# Patient Record
Sex: Male | Born: 1960 | Race: Black or African American | Hispanic: No
Health system: Midwestern US, Community
[De-identification: ages and names within clinical notes are randomized; demographics above are authoritative.]

## PROBLEM LIST (undated history)

## (undated) DIAGNOSIS — K589 Irritable bowel syndrome without diarrhea: Secondary | ICD-10-CM

## (undated) DIAGNOSIS — G8929 Other chronic pain: Secondary | ICD-10-CM

## (undated) DIAGNOSIS — R55 Syncope and collapse: Secondary | ICD-10-CM

## (undated) DIAGNOSIS — D802 Selective deficiency of immunoglobulin A [IgA]: Secondary | ICD-10-CM

## (undated) DIAGNOSIS — M1990 Unspecified osteoarthritis, unspecified site: Secondary | ICD-10-CM

## (undated) DIAGNOSIS — F528 Other sexual dysfunction not due to a substance or known physiological condition: Secondary | ICD-10-CM

## (undated) DIAGNOSIS — K219 Gastro-esophageal reflux disease without esophagitis: Secondary | ICD-10-CM

## (undated) DIAGNOSIS — F329 Major depressive disorder, single episode, unspecified: Secondary | ICD-10-CM

## (undated) DIAGNOSIS — M545 Low back pain, unspecified: Secondary | ICD-10-CM

## (undated) DIAGNOSIS — K200 Eosinophilic esophagitis: Secondary | ICD-10-CM

## (undated) DIAGNOSIS — I10 Essential (primary) hypertension: Secondary | ICD-10-CM

## (undated) DIAGNOSIS — G43909 Migraine, unspecified, not intractable, without status migrainosus: Secondary | ICD-10-CM

## (undated) DIAGNOSIS — E119 Type 2 diabetes mellitus without complications: Secondary | ICD-10-CM

## (undated) DIAGNOSIS — E785 Hyperlipidemia, unspecified: Secondary | ICD-10-CM

## (undated) DIAGNOSIS — F419 Anxiety disorder, unspecified: Secondary | ICD-10-CM

## (undated) HISTORY — DX: Gastro-esophageal reflux disease without esophagitis: K21.9

## (undated) HISTORY — DX: Irritable bowel syndrome, unspecified: K58.9

## (undated) HISTORY — DX: Anxiety disorder, unspecified: F41.9

## (undated) HISTORY — PX: LUMBAR LAMINECTOMY: SHX95

## (undated) HISTORY — DX: Other sexual dysfunction not due to a substance or known physiological condition: F52.8

## (undated) HISTORY — DX: Essential (primary) hypertension: I10

## (undated) HISTORY — DX: Major depressive disorder, single episode, unspecified: F32.9

## (undated) HISTORY — DX: Eosinophilic esophagitis: K20.0

## (undated) HISTORY — PX: PERIPHERALLY INSERTED CENTRAL CATHETER INSERTION: SHX2221

## (undated) HISTORY — PX: BACK SURGERY: SHX140

## (undated) HISTORY — DX: Unspecified osteoarthritis, unspecified site: M19.90

## (undated) HISTORY — DX: Hyperlipidemia, unspecified: E78.5

## (undated) MED ORDER — ZONISAMIDE 50 MG OR CAPS
50.00 mg | ORAL_CAPSULE | Freq: Three times a day (TID) | ORAL | 2 refills | Status: AC
Start: 2022-11-16 — End: ?

## (undated) MED ORDER — CLONAZEPAM 1 MG OR TABS
1.00 mg | ORAL_TABLET | Freq: Every evening | ORAL | 3 refills | Status: AC
Start: 2022-06-24 — End: ?

## (undated) MED ORDER — ZONISAMIDE 50 MG OR CAPS
50.0000 mg | ORAL_CAPSULE | Freq: Three times a day (TID) | ORAL | 0 refills | Status: AC
Start: 2023-07-09 — End: 2023-10-07

## (undated) MED ORDER — CLONAZEPAM 1 MG OR TABS
1.0000 mg | ORAL_TABLET | Freq: Every evening | ORAL | 3 refills | Status: AC
Start: 2021-04-27 — End: ?

---
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
## 2004-08-12 HISTORY — PX: LUMBAR FUSION: SHX111

## 2005-08-12 HISTORY — PX: LUMBAR FUSION: SHX111

## 2009-08-12 HISTORY — PX: SPINAL CORD STIMULATOR IMPLANT: SHX2422

## 2009-11-23 ENCOUNTER — Encounter: Admission: RE | Admit: 2009-11-23 | Discharge: 2009-11-23 | Payer: Self-pay | Admitting: *Deleted

## 2009-11-28 ENCOUNTER — Encounter: Admission: RE | Admit: 2009-11-28 | Discharge: 2009-11-28 | Payer: Self-pay | Admitting: *Deleted

## 2009-11-29 ENCOUNTER — Inpatient Hospital Stay (HOSPITAL_COMMUNITY): Admission: RE | Admit: 2009-11-29 | Discharge: 2009-11-30 | Payer: Self-pay | Admitting: *Deleted

## 2010-04-15 ENCOUNTER — Ambulatory Visit (HOSPITAL_COMMUNITY): Admission: RE | Admit: 2010-04-15 | Discharge: 2010-04-15 | Payer: Self-pay | Admitting: Psychiatry

## 2010-04-17 ENCOUNTER — Other Ambulatory Visit (HOSPITAL_COMMUNITY): Admission: RE | Admit: 2010-04-17 | Discharge: 2010-05-11 | Payer: Self-pay | Admitting: Psychiatry

## 2010-04-18 ENCOUNTER — Ambulatory Visit (HOSPITAL_COMMUNITY): Payer: Self-pay | Admitting: Psychiatry

## 2010-05-08 ENCOUNTER — Ambulatory Visit (HOSPITAL_COMMUNITY): Payer: Self-pay | Admitting: Psychiatry

## 2010-05-15 ENCOUNTER — Ambulatory Visit: Payer: Self-pay | Admitting: Psychiatry

## 2010-07-10 ENCOUNTER — Encounter: Admission: RE | Admit: 2010-07-10 | Discharge: 2010-07-10 | Payer: Self-pay | Admitting: *Deleted

## 2010-08-28 ENCOUNTER — Encounter
Admission: RE | Admit: 2010-08-28 | Discharge: 2010-08-28 | Payer: Self-pay | Source: Home / Self Care | Attending: Orthopedic Surgery | Admitting: Orthopedic Surgery

## 2010-09-02 ENCOUNTER — Encounter: Payer: Self-pay | Admitting: Orthopedic Surgery

## 2010-09-12 ENCOUNTER — Ambulatory Visit (HOSPITAL_COMMUNITY): Payer: Managed Care, Other (non HMO)

## 2010-09-12 ENCOUNTER — Encounter: Payer: Self-pay | Admitting: Orthopedic Surgery

## 2010-09-12 ENCOUNTER — Ambulatory Visit (HOSPITAL_COMMUNITY)
Admission: RE | Admit: 2010-09-12 | Discharge: 2010-09-13 | Disposition: A | Discharge status: Home / Self Care | Payer: Managed Care, Other (non HMO) | Source: Ambulatory Visit | Attending: Orthopedic Surgery | Admitting: Orthopedic Surgery

## 2010-09-12 DIAGNOSIS — M5030 Other cervical disc degeneration, unspecified cervical region: Secondary | ICD-10-CM | POA: Insufficient documentation

## 2010-09-12 DIAGNOSIS — I2510 Atherosclerotic heart disease of native coronary artery without angina pectoris: Secondary | ICD-10-CM | POA: Insufficient documentation

## 2010-09-12 DIAGNOSIS — I252 Old myocardial infarction: Secondary | ICD-10-CM | POA: Insufficient documentation

## 2010-09-12 HISTORY — PX: ANTERIOR CERVICAL DECOMP/DISCECTOMY FUSION: SHX1161

## 2010-09-12 LAB — URINALYSIS, ROUTINE W REFLEX MICROSCOPIC
Nitrite: NEGATIVE
Urobilinogen, UA: 1 mg/dL (ref 0.0–1.0)
pH: 6 (ref 5.0–8.0)

## 2010-09-12 LAB — COMPREHENSIVE METABOLIC PANEL
ALT: 28 U/L (ref 0–53)
AST: 21 U/L (ref 0–37)
Albumin: 4.6 g/dL (ref 3.5–5.2)
Alkaline Phosphatase: 68 U/L (ref 39–117)
Calcium: 9.5 mg/dL (ref 8.4–10.5)
GFR calc Af Amer: >60 mL/min (ref >60)
Glucose, Bld: 94 mg/dL (ref 70–99)
Potassium: 4.7 mEq/L (ref 3.5–5.1)
Sodium: 141 mEq/L (ref 135–145)
Total Protein: 7.4 g/dL (ref 6.0–8.3)

## 2010-09-12 LAB — DIFFERENTIAL
Basophils Absolute: 0 10*3/uL (ref 0.0–0.1)
Basophils Relative: 0 % (ref 0–1)
Eosinophils Relative: 5 % (ref 0–5)
Monocytes Absolute: 0.3 10*3/uL (ref 0.1–1.0)
Monocytes Relative: 6 % (ref 3–12)

## 2010-09-12 LAB — CBC
HCT: 42.3 % (ref 39.0–52.0)
MCH: 27.8 pg (ref 26.0–34.0)
MCHC: 32.4 g/dL (ref 30.0–36.0)
RDW: 13 % (ref 11.5–15.5)

## 2010-09-12 LAB — TYPE AND SCREEN
ABO/RH(D): AB POS
Antibody Screen: NEGATIVE

## 2010-09-12 LAB — APTT: aPTT: 28 seconds (ref 24–37)

## 2010-09-21 NOTE — Op Note (Signed)
NAME:  Dennis Conley, Dennis Conley                 ACCOUNT NO.:  [id number]  MEDICAL RECORD NO.:  [id number]           PATIENT TYPE:  O  LOCATION:  5037                         FACILITY:  [facility]  PHYSICIAN:  Estill Bamberg, MD      DATE OF BIRTH:  Dec 19, 1960  DATE OF PROCEDURE:  09/12/2010 DATE OF DISCHARGE:                              OPERATIVE REPORT   PREOPERATIVE DIAGNOSES: 1. C4-5, C5-6, C6-7 degenerative disk disease. 2. Left-sided neural foraminal stenosis, C4-5, C5-6, C6-7. 3. Left-sided cervical radiculopathy.  POSTOPERATIVE DIAGNOSES: 1. C4-5, C5-6, C6-7 degenerative disk disease. 2. Left-sided neural foraminal stenosis, C4-5, C5-6, C6-7. 3. Left-sided cervical radiculopathy.  PROCEDURE: 1. C4-5, C5-6, C6-7 anterior cervical decompression and fusion. 2. Placement of structural allograft x3. 3. Use of local autograft. 4. Placement of anterior instrumentation, C4 to C7. 5. Intraoperative use of fluoroscopy.  SURGEON:  Estill Bamberg, MD  ASSISTANT:  Laural Benes. Su Hilt, PA-C  ANESTHESIA:  General endotracheal anesthesia.  ESTIMATED BLOOD LOSS:  200 mL.  COMPLICATIONS:  None.  FINDINGS:  Left-sided neural foraminal stenosis, C4-5, C5-6, C6-7.  INDICATIONS FOR PROCEDURE:  Briefly, Mr. Bihl is a pleasant 50 year old male who I have following in my office with signs and symptoms related to left-sided cervical radiculopathy.  The patient failed extensive conservative measures.  I did review a CT myelogram which was consistent with prominent findings of C4-5 and C5-6, which did reveal a disk osteophyte complex compromising the left neural foramen.  The left neural foramen at the C6-7 level was also compromised to a lesser degree.  Of note, an EMG was diagnostic for left-sided C7 radiculopathy in addition an ulnar nerve compression and carpal tunnel syndrome.  The patient did have an epidural injection, which he states it did entirely alleviate his symptoms, but his pain did recur.   We therefore had a discussion regarding the procedure outlined above.  The patient did understand the risks and limitations of the procedure, as outlined in my preoperative note.  OPERATIVE DETAILS:  On September 13, 2010, the patient was brought to surgery and general endotracheal anesthesia was administered.  The patient was placed supine on an operating room table.  The neck was placed in a gentle degree of extension and the shoulders were taped to the inferior aspect of the bed.  I did use a lateral fluoroscopic view in order to help optimize the location of my incision.  A time-out procedure was performed and SCDs were placed.  Ancef was given for antibiotic prophylaxis.  The neck was then prepped and draped in the usual sterile fashion.  I then made a left-sided incision and did identify the plane between the sternocleidomastoid muscle laterally and strap muscles medially.  This plane was explored and the anterior cervical spine was readily identified.  The C4, C5, C6 and C7 vertebral bodies of subperiosteally exposed.  A self-retaining shadow line retractor was placed over the C6-7 interspace.  I then went forward with a diskectomy removing approximately two-thirds of the disk anteriorly. I then placed Caspar pins of the C6 and C7 vertebral bodies and gentle distraction was applied.  I then  used a high-speed bur in addition to curettes and Kerrison punches in order to complete my diskectomy.  The posterior longitudinal ligament was identified and this was entered using a nerve hook.  The posterior longitudinal ligament was then taken down and the right and left neural foramina were decompressed in the usual manner.  I then prepared the endplate gently using a 3-mm bur and a rasp.  I did feel that a 7-mm implant would be the appropriate fit and a 7-mm graft from the musculoskeletal transplant foundation was tamped into place.  Of note, I did collect autograft from the burring aspect  of the procedure and this was placed above and below the allograft prior to placement to help optimize fusion success.  I then went forward with a diskectomy next with the C5-6 and then the C4-5 levels the manner I described previously.  A 7-mm grafts were also placed.  Autograft was also placed above and below the graft.  Again, the neural foramen on both the right and left sides were entirely decompressed prior to placement of the graft prior to preparation of the endplates.  I then selected an appropriate-sized Synthes Vectra plate.  The plate was bent into the appropriate amount of lordosis and self-drilling and self- tapping screws were placed into the C4, C5, C6 and C7 vertebral bodies. Of note, I did use Caspar pin distraction at each intervertebral space and the holes after the Caspar pins were removed were sealed using bone wax.  All bleeding was meticulously controlled at the termination of the procedure.  At the end of the procedure, I copiously irrigated the wound.  I then closed the platysma using 2-0 Vicryl and I closed the skin using 4-0 Monocryl.  All instruments were correct at the termination of the procedure.  Of note, I did use AP and lateral fluoroscopic views while placing the anterior instrumentation in order to help optimize the location of the instrumentation.  Of note, Dan Humphreys was my assistant throughout the entire procedure and aided in essential retraction and suctioning that was required throughout the procedure.     Estill Bamberg, MD     MD/MEDQ  D:  09/12/2010  T:  09/13/2010  Job:  [id number]  Electronically Signed by Estill Bamberg  on 09/21/2010 08:17:49 AM

## 2010-09-28 LAB — POCT I-STAT 4, (NA,K, GLUC, HGB,HCT)
Glucose, Bld: 83 mg/dL (ref 70–99)
HCT: 44 % (ref 39.0–52.0)
Hemoglobin: 15 g/dL (ref 13.0–17.0)
Potassium: 6.2 mEq/L — ABNORMAL HIGH (ref 3.5–5.1)

## 2010-10-29 ENCOUNTER — Ambulatory Visit (INDEPENDENT_AMBULATORY_CARE_PROVIDER_SITE_OTHER): Payer: Managed Care, Other (non HMO) | Admitting: Internal Medicine

## 2010-10-29 ENCOUNTER — Encounter: Payer: Self-pay | Admitting: Internal Medicine

## 2010-10-29 DIAGNOSIS — M1990 Unspecified osteoarthritis, unspecified site: Secondary | ICD-10-CM | POA: Insufficient documentation

## 2010-10-29 DIAGNOSIS — F3289 Other specified depressive episodes: Secondary | ICD-10-CM

## 2010-10-29 DIAGNOSIS — F528 Other sexual dysfunction not due to a substance or known physiological condition: Secondary | ICD-10-CM | POA: Insufficient documentation

## 2010-10-29 DIAGNOSIS — R519 Headache, unspecified: Secondary | ICD-10-CM | POA: Insufficient documentation

## 2010-10-29 DIAGNOSIS — R51 Headache: Secondary | ICD-10-CM | POA: Insufficient documentation

## 2010-10-29 DIAGNOSIS — F329 Major depressive disorder, single episode, unspecified: Secondary | ICD-10-CM

## 2010-10-29 DIAGNOSIS — E785 Hyperlipidemia, unspecified: Secondary | ICD-10-CM

## 2010-10-29 HISTORY — DX: Other specified depressive episodes: F32.89

## 2010-10-29 HISTORY — DX: Other sexual dysfunction not due to a substance or known physiological condition: F52.8

## 2010-10-29 HISTORY — DX: Major depressive disorder, single episode, unspecified: F32.9

## 2010-10-29 HISTORY — DX: Unspecified osteoarthritis, unspecified site: M19.90

## 2010-10-29 HISTORY — DX: Hyperlipidemia, unspecified: E78.5

## 2010-10-31 LAB — CBC
MCV: 84.3 fL (ref 78.0–100.0)
Platelets: 160 10*3/uL (ref 150–400)
RBC: 5.16 MIL/uL (ref 4.22–5.81)
WBC: 4.1 10*3/uL (ref 4.0–10.5)

## 2010-10-31 LAB — COMPREHENSIVE METABOLIC PANEL
ALT: 48 U/L (ref 0–53)
AST: 33 U/L (ref 0–37)
Albumin: 4.8 g/dL (ref 3.5–5.2)
CO2: 24 mEq/L (ref 19–32)
Chloride: 106 mEq/L (ref 96–112)
Creatinine, Ser: 1.04 mg/dL (ref 0.4–1.5)
GFR calc Af Amer: >60 mL/min (ref >60)
GFR calc non Af Amer: >60 mL/min (ref >60)
Sodium: 138 mEq/L (ref 135–145)
Total Bilirubin: 0.6 mg/dL (ref 0.3–1.2)

## 2010-10-31 LAB — DIFFERENTIAL
Basophils Absolute: 0 10*3/uL (ref 0.0–0.1)
Eosinophils Absolute: 0 10*3/uL (ref 0.0–0.7)
Eosinophils Relative: 1 % (ref 0–5)
Lymphocytes Relative: 26 % (ref 12–46)
Lymphs Abs: 1.1 10*3/uL (ref 0.7–4.0)
Monocytes Absolute: 0.3 10*3/uL (ref 0.1–1.0)

## 2010-10-31 LAB — URINALYSIS, ROUTINE W REFLEX MICROSCOPIC
Nitrite: NEGATIVE
Specific Gravity, Urine: 1.015 (ref 1.005–1.030)
Urobilinogen, UA: 1 mg/dL (ref 0.0–1.0)

## 2010-10-31 LAB — APTT: aPTT: 29 seconds (ref 24–37)

## 2010-10-31 LAB — TYPE AND SCREEN
ABO/RH(D): AB POS
Antibody Screen: NEGATIVE

## 2010-10-31 LAB — PROTIME-INR: INR: 1.16 (ref 0.00–1.49)

## 2010-11-06 ENCOUNTER — Other Ambulatory Visit: Payer: Self-pay | Admitting: Orthopedic Surgery

## 2010-11-06 DIAGNOSIS — M545 Low back pain: Secondary | ICD-10-CM

## 2010-11-08 NOTE — Assessment & Plan Note (Signed)
Summary: new to [employer]/# / cd   Vital Signs:  Patient profile:   50 year old male Height:      74 inches Weight:      209 pounds BMI:     26.93 O2 Sat:      96 % on Room air Temp:     98.8 degrees F oral Pulse rate:   95 / minute Pulse rhythm:   regular Resp:     16 per minute BP sitting:   124 / 80  (left arm) Cuff size:   large  Vitals Entered By: Rock Nephew CMA (October 29, 2010 10:35 AM)  Nutrition Counseling: Patient's BMI is greater than 25 and therefore counseled on weight management options.  O2 Flow:  Room air  Primary Care Provider:  Etta Grandchild MD   History of Present Illness: New to me he wants to find a new PCP, he tells me that he had a complete physical with Dr. Luiz Iron 2 months ago but today he complains of ED over the last 3 months. He tells me that he has BPH but his PSA was normal 2 months ago by his report - no old records are available today.  Preventive Screening-Counseling & Management  Alcohol-Tobacco     Alcohol drinks/day: 0     Alcohol Counseling: not indicated; patient does not drink     Smoking Status: never     Tobacco Counseling: not indicated; no tobacco use  Caffeine-Diet-Exercise     Does Patient Exercise: no  Hep-HIV-STD-Contraception     Hepatitis Risk: no risk noted     HIV Risk: no risk noted     STD Risk: no risk noted      Sexual History:  currently monogamous.        Drug Use:  no.        Blood Transfusions:  no.    Current Medications (verified): 1)  Trazodone Hcl 100 Mg Tabs (Trazodone Hcl) .... Take 1 Tablet By Mouth Once A Day 2)  Prilosec 40 Mg Cpdr (Omeprazole) .... Take 1 Tablet By Mouth Once A Day 3)  Morphine Sulfate Cr 30 Mg Xr12h-Tab (Morphine Sulfate) .... 3tabs Daily 4)  Clonazepam 2 Mg Tabs (Clonazepam) .... Take 1 Tablet By Mouth Two Times A Day 5)  Gabapentin 300 Mg Caps (Gabapentin) .... 3 Tabs Three Times A Day  Allergies (verified): No Known Drug Allergies  Past History:  Past Medical  History: Depression - Dr. Dub Mikes Headache Hyperlipidemia Osteoarthritis  Past Surgical History: Cervical fusion Cervical laminectomy 2012 - Dr. Yevette Edwards Lumbar laminectomy 2007 Lumbar fusion  Family History: Reviewed history and no changes required. Family History of Alcoholism/Addiction Family History of Arthritis Family History High cholesterol Family History Hypertension Family History of Prostate CA 1st degree relative  Family History of Stroke F 1st degree relative   Social History: Reviewed history and no changes required. Married/disabled Never Smoked Alcohol use-no Drug use-no Regular exercise-no Smoking Status:  never Drug Use:  no Does Patient Exercise:  no Hepatitis Risk:  no risk noted HIV Risk:  no risk noted STD Risk:  no risk noted Sexual History:  currently monogamous Blood Transfusions:  no  Review of Systems  The patient denies anorexia, fever, weight loss, weight gain, chest pain, syncope, dyspnea on exertion, peripheral edema, prolonged cough, headaches, hemoptysis, abdominal pain, hematuria, incontinence, genital sores, suspicious skin lesions, difficulty walking, enlarged lymph nodes, and testicular masses.   GU:  Complains of erectile dysfunction; denies decreased libido, discharge,  dysuria, genital sores, hematuria, incontinence, nocturia, urinary frequency, and urinary hesitancy.  Physical Exam  General:  alert, well-developed, well-nourished, well-hydrated, appropriate dress, healthy-appearing, cooperative to examination, and good hygiene.   Head:  Aspen collar in place. Eyes:  vision grossly intact, pupils equal, pupils round, and pupils reactive to light.   Ears:  R ear normal and L ear normal.   Mouth:  Oral mucosa and oropharynx without lesions or exudates.  Teeth in good repair. Neck:  Aspen collar Lungs:  Normal respiratory effort, chest expands symmetrically. Lungs are clear to auscultation, no crackles or wheezes. Heart:  Normal rate  and regular rhythm. S1 and S2 normal without gallop, murmur, click, rub or other extra sounds. Abdomen:  soft, non-tender, normal bowel sounds, no distention, no masses, no guarding, no rigidity, no rebound tenderness, no abdominal hernia, no inguinal hernia, no hepatomegaly, and no splenomegaly.   Rectal:  No external abnormalities noted. Normal sphincter tone. No rectal masses or tenderness. Genitalia:  circumcised, no hydrocele, no varicocele, no scrotal masses, no testicular masses or atrophy, no cutaneous lesions, and no urethral discharge.   Prostate:  no nodules, no asymmetry, no induration, and 1+ enlarged.   Msk:  No deformity or scoliosis noted of thoracic or lumbar spine.   Pulses:  R and L carotid,radial,femoral,dorsalis pedis and posterior tibial pulses are full and equal bilaterally Extremities:  No clubbing, cyanosis, edema, or deformity noted with normal full range of motion of all joints.   Neurologic:  No cranial nerve deficits noted. Station and gait are normal. Plantar reflexes are down-going bilaterally. DTRs are symmetrical throughout. Sensory, motor and coordinative functions appear intact. Skin:  Intact without suspicious lesions or rashes Cervical Nodes:  No lymphadenopathy noted Axillary Nodes:  No palpable lymphadenopathy Inguinal Nodes:  No significant adenopathy Psych:  Cognition and judgment appear intact. Alert and cooperative with normal attention span and concentration. No apparent delusions, illusions, hallucinations   Impression & Recommendations:  Problem # 1:  ERECTILE DYSFUNCTION (ICD-302.72) Assessment New  I have requested records from Dr. Luiz Iron to review his recent PSA results and other info. His updated medication list for this problem includes:    Cialis 20 Mg Tabs (Tadalafil) .Marland Kitchen... Take one by mouth as directed  Discussed proper use of medications, as well as side effects.   Complete Medication List: 1)  Trazodone Hcl 100 Mg Tabs (Trazodone  hcl) .... Take 1 tablet by mouth once a day 2)  Prilosec 40 Mg Cpdr (Omeprazole) .... Take 1 tablet by mouth once a day 3)  Morphine Sulfate Cr 30 Mg Xr12h-tab (Morphine sulfate) .... 3tabs daily 4)  Clonazepam 2 Mg Tabs (Clonazepam) .... Take 1 tablet by mouth two times a day 5)  Gabapentin 300 Mg Caps (Gabapentin) .... 3 tabs three times a day 6)  Cialis 20 Mg Tabs (Tadalafil) .... Take one by mouth as directed  Colorectal Screening:  Current Recommendations:    Hemoccult: NEG X 1 today  Immunization & Chemoprophylaxis:    Tetanus vaccine: Historical  (08/12/2006)    Influenza vaccine: Historical  (09/12/2010)    Pneumovax: Historical  (09/12/2010)  Patient Instructions: 1)  Please schedule a follow-up appointment in 3 months. Prescriptions: CIALIS 20 MG TABS (TADALAFIL) take one by mouth as directed  #3 x 11   Entered and Authorized by:   Etta Grandchild MD   Signed by:   Etta Grandchild MD on 10/29/2010   Method used:   Print then Give to Patient  RxID:   [id number]    Orders Added: 1)  New Patient Level IV [56213]   Immunization History:  Influenza Immunization History:    Influenza:  historical (09/12/2010)  Pneumovax Immunization History:    Pneumovax:  historical (09/12/2010)   Immunization History:  Influenza Immunization History:    Influenza:  Historical (09/12/2010)  Pneumovax Immunization History:    Pneumovax:  Historical (09/12/2010)  Preventive Care Screening  Last Tetanus Booster:    Date:  08/12/2006    Results:  Historical   Colonoscopy:    Date:  08/12/2006    Results:  done

## 2010-11-09 ENCOUNTER — Ambulatory Visit
Admission: RE | Admit: 2010-11-09 | Discharge: 2010-11-09 | Disposition: A | Discharge status: Home / Self Care | Payer: Managed Care, Other (non HMO) | Source: Ambulatory Visit | Attending: Orthopedic Surgery | Admitting: Orthopedic Surgery

## 2010-11-09 ENCOUNTER — Telehealth: Payer: Self-pay | Admitting: Internal Medicine

## 2010-11-09 DIAGNOSIS — M545 Low back pain: Secondary | ICD-10-CM

## 2010-11-09 NOTE — Telephone Encounter (Signed)
Forwarded to Dr. Jones for review. °

## 2010-11-15 ENCOUNTER — Other Ambulatory Visit: Payer: Self-pay | Admitting: Orthopedic Surgery

## 2010-11-15 DIAGNOSIS — M545 Low back pain: Secondary | ICD-10-CM

## 2010-11-16 ENCOUNTER — Ambulatory Visit
Admission: RE | Admit: 2010-11-16 | Discharge: 2010-11-16 | Disposition: A | Discharge status: Home / Self Care | Payer: Managed Care, Other (non HMO) | Source: Ambulatory Visit | Attending: Orthopedic Surgery | Admitting: Orthopedic Surgery

## 2010-11-16 DIAGNOSIS — M545 Low back pain: Secondary | ICD-10-CM

## 2010-11-27 ENCOUNTER — Other Ambulatory Visit: Payer: Self-pay | Admitting: Orthopedic Surgery

## 2010-11-27 DIAGNOSIS — M545 Low back pain: Secondary | ICD-10-CM

## 2010-12-04 ENCOUNTER — Other Ambulatory Visit: Payer: Managed Care, Other (non HMO)

## 2010-12-14 ENCOUNTER — Ambulatory Visit (HOSPITAL_COMMUNITY)
Admission: RE | Admit: 2010-12-14 | Discharge: 2010-12-14 | Disposition: A | Discharge status: Home / Self Care | Payer: Self-pay | Source: Ambulatory Visit | Attending: Psychiatry | Admitting: Psychiatry

## 2010-12-14 DIAGNOSIS — F332 Major depressive disorder, recurrent severe without psychotic features: Secondary | ICD-10-CM | POA: Insufficient documentation

## 2010-12-24 ENCOUNTER — Other Ambulatory Visit (HOSPITAL_COMMUNITY): Payer: Self-pay | Admitting: Psychiatry

## 2010-12-25 ENCOUNTER — Other Ambulatory Visit (HOSPITAL_COMMUNITY): Payer: Self-pay | Attending: Psychiatry | Admitting: Psychiatry

## 2010-12-25 DIAGNOSIS — F332 Major depressive disorder, recurrent severe without psychotic features: Secondary | ICD-10-CM | POA: Insufficient documentation

## 2010-12-25 DIAGNOSIS — M549 Dorsalgia, unspecified: Secondary | ICD-10-CM | POA: Insufficient documentation

## 2010-12-25 DIAGNOSIS — F411 Generalized anxiety disorder: Secondary | ICD-10-CM | POA: Insufficient documentation

## 2010-12-25 DIAGNOSIS — G8929 Other chronic pain: Secondary | ICD-10-CM | POA: Insufficient documentation

## 2010-12-26 ENCOUNTER — Other Ambulatory Visit (HOSPITAL_COMMUNITY): Payer: Self-pay | Admitting: Psychiatry

## 2010-12-27 ENCOUNTER — Other Ambulatory Visit (HOSPITAL_COMMUNITY): Payer: Self-pay | Admitting: Psychiatry

## 2010-12-28 ENCOUNTER — Other Ambulatory Visit (HOSPITAL_COMMUNITY): Payer: Self-pay | Admitting: Psychiatry

## 2010-12-31 ENCOUNTER — Other Ambulatory Visit (HOSPITAL_COMMUNITY): Payer: Self-pay | Admitting: Psychiatry

## 2011-01-01 ENCOUNTER — Other Ambulatory Visit (HOSPITAL_COMMUNITY): Payer: Self-pay | Admitting: Psychiatry

## 2011-01-02 ENCOUNTER — Encounter: Payer: Self-pay | Admitting: Internal Medicine

## 2011-01-02 ENCOUNTER — Ambulatory Visit: Payer: Self-pay | Admitting: Internal Medicine

## 2011-01-02 ENCOUNTER — Other Ambulatory Visit (HOSPITAL_COMMUNITY): Payer: Self-pay | Admitting: Psychiatry

## 2011-01-02 DIAGNOSIS — Z0000 Encounter for general adult medical examination without abnormal findings: Secondary | ICD-10-CM | POA: Insufficient documentation

## 2011-01-03 ENCOUNTER — Other Ambulatory Visit (HOSPITAL_BASED_OUTPATIENT_CLINIC_OR_DEPARTMENT_OTHER): Payer: Self-pay | Admitting: Psychiatry

## 2011-01-03 ENCOUNTER — Encounter: Payer: Self-pay | Admitting: Internal Medicine

## 2011-01-03 ENCOUNTER — Ambulatory Visit (INDEPENDENT_AMBULATORY_CARE_PROVIDER_SITE_OTHER): Payer: Self-pay | Admitting: Internal Medicine

## 2011-01-03 VITALS — BP 130/86 | HR 121 | Temp 98.6°F | Ht 74.0 in | Wt 196.0 lb

## 2011-01-03 DIAGNOSIS — F332 Major depressive disorder, recurrent severe without psychotic features: Secondary | ICD-10-CM

## 2011-01-03 DIAGNOSIS — G8929 Other chronic pain: Secondary | ICD-10-CM | POA: Insufficient documentation

## 2011-01-03 DIAGNOSIS — F329 Major depressive disorder, single episode, unspecified: Secondary | ICD-10-CM

## 2011-01-03 DIAGNOSIS — M549 Dorsalgia, unspecified: Secondary | ICD-10-CM

## 2011-01-03 MED ORDER — OXYCODONE-ACETAMINOPHEN 10-325 MG PO TABS: 1.0000 | ORAL_TABLET | Freq: Four times a day (QID) | ORAL | Status: DC | PRN

## 2011-01-03 NOTE — Progress Notes (Signed)
Subjective:    Patient ID: Dennis Conley, male    DOB: 12/09/1960, 50 y.o.   MRN: [id number]  HPI  Pt of Dr Yetta Barre who is not here today;  S/p 3 level (S1, L2, and L3) back fusion 2005 in [place] with re-do screw surgury 2006, with chronic pain since then, now on dilaudid tid, and cymbalta.  Moved to [place] 2010, been seeing [facility] since then with some recurring numbness to left thigh and recurring pain to whole leg distal to toes;  In 2011 with spinal cord stimulator which worked well for only 2 months;  Also with recent worsening c-spine disc dz with 3 level surgury (? Fusion) in jan 2012 - Dr Yevette Edwards.  Also seeing [facility] in [place], with tentative plan for repeat spinal cord stimulator, but pt is thinking he has decided against this.   Last myelogram (cant do MRI due to metal in the back) with worsening L4 disc dz, now s/p ESI x 2 two wks ago, but did not seem to help, so pt does not want further disc surgury (and ortho agrees).  Has come to the end of his options and wants to come here for simple pain meds, and requests percocet instead of Morphine or dilaudid.  Denies worsening depressive symptoms, suicidal ideation, or panic.   Past Medical History  Diagnosis Date  . DEPRESSION 10/29/2010  . ERECTILE DYSFUNCTION 10/29/2010  . Headache 10/29/2010  . HYPERLIPIDEMIA 10/29/2010  . OSTEOARTHRITIS 10/29/2010   Past Surgical History  Procedure Date  . Cervical fusion   . Cervical laminectomy 2012    Dr. Yevette Edwards  . Lumber laminectomy 2007  . Lumbar fusion     reports that he has never smoked. He does not have any smokeless tobacco history on file. He reports that he does not drink alcohol or use illicit drugs. family history includes Alcohol abuse in his other; Arthritis in his other; Cancer in his other; Hyperlipidemia in his other; Hypertension in his other; and Stroke in his other. No Known Allergies Current Outpatient Prescriptions on File Prior to Visit    Medication Sig Dispense Refill  . clonazePAM (KLONOPIN) 2 MG tablet Take 2 mg by mouth 2 (two) times daily.        Marland Kitchen gabapentin (NEURONTIN) 300 MG capsule 3 tablets 3 times per day       . omeprazole (PRILOSEC) 40 MG capsule Take 40 mg by mouth daily.        Marland Kitchen morphine (MS CONTIN) 30 MG 12 hr tablet Take 30 mg by mouth 3 (three) times daily.        . tadalafil (CIALIS) 20 MG tablet Take 20 mg by mouth daily as needed.        . traZODone (DESYREL) 100 MG tablet Take 100 mg by mouth daily.         Review of Systems All otherwise neg per pt     Objective:   Physical Exam BP 130/86  Pulse 121  Temp(Src) 98.6 F (37 C) (Oral)  Ht 6\' 2"  (1.88 m)  Wt 196 lb (88.905 kg)  BMI 25.16 kg/m2  SpO2 98% Physical Exam  VS noted Constitutional: Pt appears well-developed and well-nourished.  HENT: Head: Normocephalic.  Right Ear: External ear normal.  Left Ear: External ear normal.  Eyes: Conjunctivae and EOM are normal. Pupils are equal, round, and reactive to light.  Neck: Normal range of motion. Neck supple.  Cardiovascular: Normal rate and regular rhythm.   Pulmonary/Chest:  Effort normal and breath sounds normal.  Abd:  Soft, NT, non-distended, + BS Neurological: Pt is alert. No cranial nerve deficit.  Skin: Skin is warm. No erythema.  Psychiatric: Pt behavior is normal. Thought content normal. Not depressed appearing       Assessment & Plan:

## 2011-01-03 NOTE — Assessment & Plan Note (Signed)
Overall stable, will defer to pt and try to cut back to percocet qid prn, and refer to local pain management

## 2011-01-03 NOTE — Assessment & Plan Note (Signed)
stable overall by hx and exam, most recent lab reviewed with pt, and pt to continue medical treatment as before  Lab Results  Component Value Date   WBC 5.1 09/12/2010   HGB 15.0 09/12/2010   HCT 44.0 09/12/2010   PLT 178 09/12/2010   ALT 28 09/12/2010   AST 21 09/12/2010   NA 139 09/12/2010   K 6.2* 09/12/2010   CL 101 09/12/2010   CREATININE 1.22 09/12/2010   BUN 13 09/12/2010   CO2 28 09/12/2010   INR 1.06 09/12/2010

## 2011-01-03 NOTE — Patient Instructions (Signed)
Take all new medications as prescribed Continue all other medications as before You will be contacted regarding the referral for: Pain clinic

## 2011-01-04 ENCOUNTER — Other Ambulatory Visit (HOSPITAL_COMMUNITY): Payer: Self-pay | Admitting: Psychiatry

## 2011-01-08 ENCOUNTER — Other Ambulatory Visit (HOSPITAL_COMMUNITY): Payer: Self-pay | Admitting: Psychiatry

## 2011-01-09 ENCOUNTER — Other Ambulatory Visit (HOSPITAL_COMMUNITY): Payer: Self-pay | Admitting: Psychiatry

## 2011-01-10 ENCOUNTER — Other Ambulatory Visit (HOSPITAL_COMMUNITY): Payer: Self-pay | Admitting: Psychiatry

## 2011-01-11 ENCOUNTER — Other Ambulatory Visit (HOSPITAL_COMMUNITY): Payer: Self-pay | Admitting: Psychiatry

## 2011-01-22 ENCOUNTER — Encounter (HOSPITAL_COMMUNITY): Payer: Self-pay | Admitting: Marriage and Family Therapist

## 2011-01-22 ENCOUNTER — Telehealth: Payer: Self-pay | Admitting: *Deleted

## 2011-01-22 NOTE — Telephone Encounter (Signed)
Pt c/o elevated BP and HR off and on. BP was 145/97, HR 100 today and has been in the 140's/90's off and on. NO CP or SOB. He gets sweaty w/activity. Advised to call w/any changes and see MD soon - scheduled for OV tomorrow for eval ov BP.

## 2011-01-23 ENCOUNTER — Encounter: Payer: Self-pay | Admitting: Internal Medicine

## 2011-01-23 ENCOUNTER — Other Ambulatory Visit (INDEPENDENT_AMBULATORY_CARE_PROVIDER_SITE_OTHER): Payer: Self-pay | Admitting: Internal Medicine

## 2011-01-23 ENCOUNTER — Ambulatory Visit (INDEPENDENT_AMBULATORY_CARE_PROVIDER_SITE_OTHER): Payer: Self-pay | Admitting: Internal Medicine

## 2011-01-23 ENCOUNTER — Other Ambulatory Visit (INDEPENDENT_AMBULATORY_CARE_PROVIDER_SITE_OTHER): Payer: Self-pay

## 2011-01-23 VITALS — BP 138/100 | HR 94 | Temp 97.8°F | Resp 16 | Wt 191.0 lb

## 2011-01-23 DIAGNOSIS — E7800 Pure hypercholesterolemia, unspecified: Secondary | ICD-10-CM

## 2011-01-23 DIAGNOSIS — Z0000 Encounter for general adult medical examination without abnormal findings: Secondary | ICD-10-CM

## 2011-01-23 DIAGNOSIS — I10 Essential (primary) hypertension: Secondary | ICD-10-CM | POA: Insufficient documentation

## 2011-01-23 LAB — LIPID PANEL
Cholesterol: 309 mg/dL — ABNORMAL HIGH (ref 0–200)
HDL: 48.9 mg/dL (ref >39.00)
Triglycerides: 135 mg/dL (ref 0.0–149.0)
VLDL: 27 mg/dL (ref 0.0–40.0)

## 2011-01-23 LAB — CBC WITH DIFFERENTIAL/PLATELET
Eosinophils Relative: 0.8 % (ref 0.0–5.0)
HCT: 43.5 % (ref 39.0–52.0)
Lymphs Abs: 1.7 10*3/uL (ref 0.7–4.0)
MCV: 83.9 fl (ref 78.0–100.0)
Monocytes Absolute: 0.2 10*3/uL (ref 0.1–1.0)
Platelets: 192 10*3/uL (ref 150.0–400.0)
RDW: 13.9 % (ref 11.5–14.6)
WBC: 5.6 10*3/uL (ref 4.5–10.5)

## 2011-01-23 LAB — COMPREHENSIVE METABOLIC PANEL
Alkaline Phosphatase: 109 U/L (ref 39–117)
Creatinine, Ser: 1.1 mg/dL (ref 0.4–1.5)
Glucose, Bld: 110 mg/dL — ABNORMAL HIGH (ref 70–99)
Sodium: 142 mEq/L (ref 135–145)
Total Bilirubin: 0.6 mg/dL (ref 0.3–1.2)
Total Protein: 8.5 g/dL — ABNORMAL HIGH (ref 6.0–8.3)

## 2011-01-23 LAB — URINALYSIS, ROUTINE W REFLEX MICROSCOPIC
Bilirubin Urine: NEGATIVE
Ketones, ur: NEGATIVE
Urine Glucose: NEGATIVE
Urobilinogen, UA: 1 (ref 0.0–1.0)

## 2011-01-23 LAB — TSH: TSH: 0.93 u[IU]/mL (ref 0.35–5.50)

## 2011-01-23 MED ORDER — NEBIVOLOL HCL 10 MG PO TABS: 10.0000 mg | ORAL_TABLET | Freq: Every day | ORAL | Status: DC

## 2011-01-23 NOTE — Assessment & Plan Note (Signed)
His EKG is normal so I don't think he has any cardiac involvement, today I will start Bystolic and screen him for secondary causes of hypertension

## 2011-01-23 NOTE — Assessment & Plan Note (Signed)
I will check his FLP and TSH today

## 2011-01-23 NOTE — Patient Instructions (Signed)

## 2011-01-23 NOTE — Progress Notes (Signed)
  Subjective:    Patient ID: Dennis Conley, male    DOB: Jan 16, 1961, 50 y.o.   MRN: [id number]  Hypertension This is a new problem. The current episode started more than 1 month ago. The problem has been gradually worsening since onset. The problem is uncontrolled. Pertinent negatives include no anxiety, blurred vision, chest pain, headaches, malaise/fatigue, neck pain, orthopnea, palpitations, peripheral edema, PND, shortness of breath or sweats. Past treatments include nothing. There are no compliance problems.       Review of Systems  Constitutional: Negative for fever, chills, malaise/fatigue, diaphoresis, activity change, appetite change, fatigue and unexpected weight change.  HENT: Negative for neck pain.   Eyes: Negative.  Negative for blurred vision.  Respiratory: Negative for apnea, cough, choking, chest tightness, shortness of breath, wheezing and stridor.   Cardiovascular: Negative for chest pain, palpitations, orthopnea, leg swelling and PND.  Gastrointestinal: Negative for nausea, vomiting, abdominal pain, diarrhea, constipation and blood in stool.  Genitourinary: Negative.  Negative for dysuria, urgency, frequency, hematuria, decreased urine volume, enuresis and difficulty urinating.  Musculoskeletal: Positive for back pain. Negative for myalgias, joint swelling, arthralgias and gait problem.  Skin: Negative for color change, pallor and rash.  Neurological: Negative for dizziness, tremors, seizures, syncope, facial asymmetry, speech difficulty, weakness, light-headedness, numbness and headaches.  Hematological: Negative.   Psychiatric/Behavioral: Negative.        Objective:   Physical Exam  Vitals reviewed. Constitutional: He is oriented to person, place, and time. He appears well-nourished. No distress.  HENT:  Head: Normocephalic and atraumatic.  Right Ear: External ear normal.  Left Ear: External ear normal.  Nose: Nose normal.  Mouth/Throat: Oropharynx is clear and  moist. No oropharyngeal exudate.  Eyes: Conjunctivae and EOM are normal. Pupils are equal, round, and reactive to light. Right eye exhibits no discharge. Left eye exhibits no discharge. No scleral icterus.  Neck: Normal range of motion. Neck supple. No JVD present. No tracheal deviation present. No thyromegaly present.  Cardiovascular: Normal rate, regular rhythm, normal heart sounds and intact distal pulses.  Exam reveals no gallop and no friction rub.   No murmur heard. Pulmonary/Chest: Effort normal and breath sounds normal. No stridor. No respiratory distress. He has no wheezes. He has no rales. He exhibits no tenderness.  Abdominal: Soft. Bowel sounds are normal. He exhibits no distension and no mass. There is no tenderness. There is no rebound and no guarding.  Musculoskeletal: Normal range of motion. He exhibits no edema and no tenderness.  Lymphadenopathy:    He has no cervical adenopathy.  Neurological: He is alert and oriented to person, place, and time. He has normal reflexes. No cranial nerve deficit. Coordination normal.  Skin: Skin is warm and dry. No rash noted. He is not diaphoretic. No erythema. No pallor.  Psychiatric: He has a normal mood and affect. His behavior is normal. Judgment and thought content normal.          Assessment & Plan:

## 2011-01-25 ENCOUNTER — Encounter (HOSPITAL_COMMUNITY): Payer: Self-pay | Admitting: Psychiatry

## 2011-01-25 DIAGNOSIS — F331 Major depressive disorder, recurrent, moderate: Secondary | ICD-10-CM

## 2011-01-25 NOTE — Group Therapy Note (Signed)
NAME:  Dennis Conley, ENDSLEY                 ACCOUNT NO.:  [id number]  MEDICAL RECORD NO.:  [id number]  LOCATION:  [facility]                           FACILITY:  [facility]  PHYSICIAN:  Rush Salce T. Samari Gorby, M.D.   DATE OF BIRTH:  13-May-1961                                PROGRESS NOTE   Patient is a 50 year old African American male who is referred from intensive outpatient program.  Patient recently finished with the program.  This is his 2nd IUP program in 1 year.  Patient endorsed increased anxiety, depression, feeling of hopelessness and helplessness, and needed the program again to stabilize himself.  He was seen initially in September by our PA, [name], however, after 1st visit he decided to go to Dr. Dub Mikes but due to lack of insurance he could not keep appointment with him.  Patient is on Cymbalta 120 mg which was increased from 90 to 120 by Dr. Dub Mikes a few months ago.  He is also on Klonopin 1 mg twice a day and prescribed trazodone for sleep but he has been not taking trazodone and is sleeping better.  Patient endorsed a long history of anxiety and depression, admitted given Klonopin and Xanax since age 46 when he was in [place] prescribed by primary care doctor.  He has tried in the past Effexor and Lexapro but caused significant side effects.  He is on Cymbalta for at least 8 months initially prescribed by primary care doctor, However, continued in intensive outpatient program and then Dr. Dub Mikes and then again continued at intensive outpatient program at [facility].  Patient came today for appointment.  He continued to endorse social anxiety, lack of energy, feeling tired, and decreased motivation; however, he felt somewhat better since he graduated from the program.  He started to go outside from his room and some participation in his daily activities. Recently he has been taking medicine for his blood pressure which he believes coming from the stress.  He continued to have a lot of  racing thoughts and nervousness but he denies any active or passive suicidal thinking.  MEDICAL HISTORY: Patient has chronic back pain and neck pain due to neck fusion and back surgery in the past.  He also has recently diagnosed with high blood pressure.  He is seeing Dr. Yetta Barre at [facility].  He is taking Percocet 1 tablet every 6 hours and Bystolic 10 mg daily.  OTHER MEDICATIONS: 1. Cymbalta 120 mg daily. 2. Klonopin 1 mg twice a day.  PSYCHOSOCIAL HISTORY: Patient lives with his wife with 5 children who are under 14.  He has lived in [place], [place], [place], and then moved to [place] due to the job reason, however, he is currently disabled and on disability since August 2011 as he cannot work due to significant anxiety and panic attack.  MENTAL STATUS EXAMINATION: Patient is a thin and lean person.  He is casually dressed.  He maintained fair eye contact.  His speech is very soft but clear and coherent.  His thought processes were logical, linear, and goal directed.  He described his mood as anxious and affect was constricted. There were no abnormal movements  noted.  He denies any auditory hallucinations, suicidal thoughts, or homicidal thoughts.  There was no psychosis present.  His attention and concentration were okay.  He is alert and oriented x3.  His insight, judgment, and impulse control were ok.  DIAGNOSES: 1. Major depressive disorder, recurrent. 2. Anxiety disorder, not otherwise specified.  PLAN: Patient would like to try generic medication as he cannot afford Cymbalta 120 mg.  He may get insurance in the next few months through his wife but at this time he is paying cash on all his medications.  We talked about trying Elavil to control his anxiety, depression, and may help some back pain.  We will cut down his Cymbalta to 60 mg daily and start the Elavil starting at 25 mg to titrate up to 50 mg in a few weeks.  He will continue the  Klonopin, however, I have recommended to try half at bedtime since amitriptyline may help also in sleep.  Patient does not drink.  We talked at length about medication side effects, benefits, and interaction with illegal substances and alcohol.  He is also scheduled to see a therapist next week.  I recommended to call us if he has worsening of symptoms or any time if he has any suicidal thinking or homicidal thinking then he needs to contact us immediately or go to local ER.  I will see him again in 2 to 3 weeks.     Tre Sanker T. Lolly Mustache, M.D.     STA/MEDQ  D:  01/25/2011  T:  01/25/2011  Job:  [id number]  Electronically Signed by Kathryne Sharper M.D. on 01/25/2011 12:12:09 PM

## 2011-01-29 ENCOUNTER — Encounter (HOSPITAL_BASED_OUTPATIENT_CLINIC_OR_DEPARTMENT_OTHER): Payer: Self-pay | Admitting: Marriage and Family Therapist

## 2011-01-29 DIAGNOSIS — F410 Panic disorder [episodic paroxysmal anxiety] without agoraphobia: Secondary | ICD-10-CM

## 2011-01-29 DIAGNOSIS — F339 Major depressive disorder, recurrent, unspecified: Secondary | ICD-10-CM

## 2011-01-30 ENCOUNTER — Ambulatory Visit (INDEPENDENT_AMBULATORY_CARE_PROVIDER_SITE_OTHER): Payer: Self-pay | Admitting: Internal Medicine

## 2011-01-30 ENCOUNTER — Encounter: Payer: Self-pay | Admitting: Internal Medicine

## 2011-01-30 DIAGNOSIS — M549 Dorsalgia, unspecified: Secondary | ICD-10-CM

## 2011-01-30 DIAGNOSIS — E7800 Pure hypercholesterolemia, unspecified: Secondary | ICD-10-CM

## 2011-01-30 DIAGNOSIS — G8929 Other chronic pain: Secondary | ICD-10-CM

## 2011-01-30 DIAGNOSIS — I10 Essential (primary) hypertension: Secondary | ICD-10-CM

## 2011-01-30 MED ORDER — OXYCODONE-ACETAMINOPHEN 10-325 MG PO TABS: 1.0000 | ORAL_TABLET | Freq: Four times a day (QID) | ORAL | Status: DC | PRN

## 2011-01-30 MED ORDER — ROSUVASTATIN CALCIUM 10 MG PO TABS: 10.0000 mg | ORAL_TABLET | Freq: Every day | ORAL | Status: DC

## 2011-01-30 MED ORDER — NEBIVOLOL HCL 10 MG PO TABS: 10.0000 mg | ORAL_TABLET | Freq: Every day | ORAL | Status: DC

## 2011-01-30 NOTE — Progress Notes (Signed)
Subjective:    Patient ID: Dennis Conley, male    DOB: January 16, 1961, 50 y.o.   MRN: [id number]  Hypertension This is a chronic problem. The current episode started more than 1 year ago. The problem has been rapidly improving since onset. The problem is controlled. Pertinent negatives include no anxiety, blurred vision, chest pain, headaches, malaise/fatigue, neck pain, orthopnea, palpitations, peripheral edema, PND, shortness of breath or sweats. There are no associated agents to hypertension. Past treatments include beta blockers. The current treatment provides significant improvement. There are no compliance problems.  There is no history of chronic renal disease.  Back Pain This is a chronic problem. The current episode started more than 1 year ago. The problem occurs intermittently. The problem is unchanged. The pain is present in the lumbar spine. The quality of the pain is described as aching. The pain does not radiate. The pain is at a severity of 5/10. The pain is mild. The pain is worse during the day. Stiffness is present all day. Pertinent negatives include no abdominal pain, bladder incontinence, bowel incontinence, chest pain, dysuria, fever, headaches, leg pain, numbness, paresis, paresthesias, pelvic pain, perianal numbness, tingling, weakness or weight loss. He has tried NSAIDs for the symptoms. The treatment provided no relief.  Hyperlipidemia This is a chronic problem. The current episode started more than 1 year ago. The problem is uncontrolled. Recent lipid tests were reviewed and are variable. He has no history of chronic renal disease, diabetes, hypothyroidism, liver disease, obesity or nephrotic syndrome. Factors aggravating his hyperlipidemia include beta blockers. Pertinent negatives include no chest pain, focal sensory loss, focal weakness, leg pain, myalgias or shortness of breath. He is currently on no antihyperlipidemic treatment.      Review of Systems  Constitutional:  Negative for fever, chills, weight loss, malaise/fatigue, diaphoresis, activity change, appetite change, fatigue and unexpected weight change.  HENT: Negative for facial swelling and neck pain.   Eyes: Negative.  Negative for blurred vision.  Respiratory: Negative for cough, shortness of breath, wheezing and stridor.   Cardiovascular: Negative for chest pain, palpitations, orthopnea, leg swelling and PND.  Gastrointestinal: Negative.  Negative for abdominal pain and bowel incontinence.  Genitourinary: Negative.  Negative for bladder incontinence, dysuria and pelvic pain.  Musculoskeletal: Positive for back pain. Negative for myalgias.  Skin: Negative.   Neurological: Negative for dizziness, tingling, tremors, focal weakness, seizures, syncope, facial asymmetry, speech difficulty, weakness, light-headedness, numbness, headaches and paresthesias.  Hematological: Negative.  Negative for adenopathy. Does not bruise/bleed easily.  Psychiatric/Behavioral: Negative.        Objective:   Physical Exam  Vitals reviewed. Constitutional: He is oriented to person, place, and time. He appears well-developed and well-nourished. No distress.  HENT:  Head: Normocephalic and atraumatic.  Right Ear: External ear normal.  Left Ear: External ear normal.  Nose: Nose normal.  Mouth/Throat: Oropharynx is clear and moist. No oropharyngeal exudate.  Eyes: Conjunctivae and EOM are normal. Pupils are equal, round, and reactive to light. Right eye exhibits no discharge. Left eye exhibits no discharge. No scleral icterus.  Neck: Normal range of motion. Neck supple. No JVD present. No tracheal deviation present. No thyromegaly present.  Cardiovascular: Normal rate, regular rhythm, normal heart sounds and intact distal pulses.  Exam reveals no gallop and no friction rub.   No murmur heard. Pulmonary/Chest: Effort normal and breath sounds normal. No stridor. No respiratory distress. He has no wheezes. He has no rales.  He exhibits no tenderness.  Abdominal: Soft. Bowel sounds  are normal. He exhibits no distension and no mass. There is no tenderness. There is no rebound and no guarding.  Musculoskeletal: He exhibits no edema and no tenderness.       Lumbar back: Normal. He exhibits normal range of motion, no tenderness, no bony tenderness, no swelling, no edema and no deformity.  Lymphadenopathy:    He has no cervical adenopathy.  Neurological: He is alert and oriented to person, place, and time. He has normal reflexes. He displays normal reflexes. No cranial nerve deficit. He exhibits normal muscle tone. Coordination normal.  Skin: Skin is warm and dry. No rash noted. He is not diaphoretic. No erythema. No pallor.  Psychiatric: He has a normal mood and affect. His behavior is normal. Judgment and thought content normal.        Lab Results  Component Value Date   WBC 5.6 01/23/2011   HGB 15.0 01/23/2011   HCT 43.5 01/23/2011   PLT 192.0 01/23/2011   CHOL 309* 01/23/2011   TRIG 135.0 01/23/2011   HDL 48.90 01/23/2011   LDLDIRECT 240.6 01/23/2011   ALT 33 01/23/2011   AST 20 01/23/2011   NA 142 01/23/2011   K 4.0 01/23/2011   CL 107 01/23/2011   CREATININE 1.1 01/23/2011   BUN 8 01/23/2011   CO2 26 01/23/2011   TSH 0.93 01/23/2011   INR 1.06 09/12/2010    Assessment & Plan:

## 2011-01-30 NOTE — Assessment & Plan Note (Signed)
Continue percocet prn 

## 2011-01-30 NOTE — Assessment & Plan Note (Signed)
Start crestor and he was given pt ed material

## 2011-01-30 NOTE — Patient Instructions (Signed)
Back Pain (Lumbosacral Strain) Back pain is one of the most common causes of pain. There are many causes of back pain. Most are not serious conditions.  CAUSES Your backbone (spinal column) is made up of 24 main vertebral bodies, the sacrum, and the coccyx. These are held together by muscles and tough, fibrous tissue (ligaments). Nerve roots pass through the openings between the vertebrae. A sudden move or injury to the back may cause injury to, or pressure on, these nerves. This may result in localized back pain or pain movement (radiation) into the buttocks, down the leg, and into the foot. Sharp, shooting pain from the buttock down the back of the leg (sciatica) is frequently associated with a ruptured (herniated) disc. Pain may be caused by muscle spasm alone. Your caregiver can often find the cause of your pain by the details of your symptoms and an exam. In some cases, you may need tests (such as X-rays). Your caregiver will work with you to decide if any tests are needed based on your specific exam. HOME CARE INSTRUCTIONS  Avoid an underactive lifestyle. Active exercise, as directed by your caregiver, is your greatest weapon against back pain.   Avoid hard physical activities (tennis, racquetball, water-skiing) if you are not in proper physical condition for it. This may aggravate and/or create problems.   If you have a back problem, avoid sports requiring sudden body movements. Swimming and walking are generally safer activities.   Maintain good posture.   Avoid becoming overweight (obese).   Use bed rest for only the most extreme, sudden (acute) episode. Your caregiver will help you determine how much bed rest is necessary.   For acute conditions, you may put ice on the injured area.   Put ice in a plastic bag.   Place a towel between your skin and the bag.   Leave the ice on for 20 minutes at a time, every 2 hours, or as needed.   After you are improved and more active, it may  help to apply heat for 30 minutes before activities.  See your caregiver if you are having pain that lasts longer than expected. Your caregiver can advise appropriate exercises and/or therapy if needed. With conditioning, most back problems can be avoided. SEEK IMMEDIATE MEDICAL CARE IF:  You have numbness, tingling, weakness, or problems with the use of your arms or legs.   You experience severe back pain not relieved with medicines.   There is a change in bowel or bladder control.   You have increasing pain in any area of the body, including your belly (abdomen).   You notice shortness of breath, dizziness, or feel faint.   You feel sick to your stomach (nauseous), are throwing up (vomiting), or become sweaty.   You notice discoloration of your toes or legs, or your feet get very cold.   Your back pain is getting worse.  You have an oral temperature above 100.5Hypertension (High Blood Pressure) As your heart beats, it forces blood through your arteries. This force is your blood pressure. If the pressure is too high, it is called hypertension (HTN) or high blood pressure. HTN is dangerous because you may have it and not know it. High blood pressure may mean that your heart has to work harder to pump blood. Your arteries may be narrow or stiff. The extra work puts you at risk for heart disease, stroke, and other problems.  Blood pressure consists of two numbers, a higher number over a lower,   110/72, for example. It is stated as "110 over 72." The ideal is below 120 for the top number (systolic) and under 80 for the bottom (diastolic). Write down your blood pressure today. You should pay close attention to your blood pressure if you have certain conditions such as: Heart failure. Prior heart attack.  Diabetes  Chronic kidney disease.  Prior stroke.  Multiple risk factors for heart disease.   To see if you have HTN, your blood pressure should be measured while you are seated with your arm  held at the level of the heart. It should be measured at least twice. A one-time elevated blood pressure reading (especially in the Emergency Department) does not mean that you need treatment. There may be conditions in which the blood pressure is different between your right and left arms. It is important to see your caregiver soon for a recheck. Most people have essential hypertension which means that there is not a specific cause. This type of high blood pressure may be lowered by changing lifestyle factors such as: Stress. Smoking.  Lack of exercise.  Excessive weight. Drug/tobacco/alcohol use.  Eating less salt.   Most people do not have symptoms from high blood pressure until it has caused damage to the body. Effective treatment can often prevent, delay or reduce that damage. TREATMENT Treatment for high blood pressure, when a cause has been identified, is directed at the cause. There are a large number of medications to treat HTN. These fall into several categories, and your caregiver will help you select the medicines that are best for you. Medications may have side effects. You should review side effects with your caregiver. If your blood pressure stays high after you have made lifestyle changes or started on medicines,  Your medication(s) may need to be changed.  Other problems may need to be addressed.  Be certain you understand your prescriptions, and know how and when to take your medicine.  Be sure to follow up with your caregiver within the time frame advised (usually within two weeks) to have your blood pressure rechecked and to review your medications.  If you are taking more than one medicine to lower your blood pressure, make sure you know how and at what times they should be taken. Taking two medicines at the same time can result in blood pressure that is too low.  SEEK IMMEDIATE MEDICAL CARE IF YOU DEVELOP: A severe headache, blurred or changing vision, or confusion.  Unusual  weakness or numbness, or a faint feeling.  Severe chest or abdominal pain, vomiting, or breathing problems.  MAKE SURE YOU:  Understand these instructions.  Will watch your condition.  Will get help right away if you are not doing well or get worse.  Document Released: 07/29/2005 Document Re-Released: 01/16/2010  Exit[employer] Patient Information 2011 [employer] not controlled by medicine.  MAKE SURE YOU:   Understand these instructions.   Will watch your condition.   Will get help right away if you are not doing well or get worse.  Document Released: 05/08/2005 Document Re-Released: 10/23/2009 [employer] Patient Information 2011 [employer] 

## 2011-01-30 NOTE — Assessment & Plan Note (Signed)
He is doing very well on Bystolic

## 2011-02-07 ENCOUNTER — Encounter (HOSPITAL_COMMUNITY): Payer: Self-pay | Admitting: Marriage and Family Therapist

## 2011-02-11 ENCOUNTER — Encounter (HOSPITAL_COMMUNITY): Payer: Self-pay | Admitting: Psychiatry

## 2011-02-21 ENCOUNTER — Encounter (HOSPITAL_COMMUNITY): Payer: Self-pay | Admitting: Marriage and Family Therapist

## 2011-03-01 ENCOUNTER — Ambulatory Visit: Payer: Self-pay | Admitting: Internal Medicine

## 2011-03-01 ENCOUNTER — Telehealth: Payer: Self-pay

## 2011-03-01 DIAGNOSIS — G8929 Other chronic pain: Secondary | ICD-10-CM

## 2011-03-01 MED ORDER — OXYCODONE-ACETAMINOPHEN 10-325 MG PO TABS: 1.0000 | ORAL_TABLET | Freq: Four times a day (QID) | ORAL | Status: DC | PRN

## 2011-03-01 NOTE — Telephone Encounter (Signed)
Patient called Dennis Conley requesting percocet refill, Is this ok to refill?

## 2011-03-01 NOTE — Telephone Encounter (Signed)
Patient notified and states wife will pick up

## 2011-03-01 NOTE — Telephone Encounter (Signed)
done

## 2011-03-04 ENCOUNTER — Encounter (HOSPITAL_COMMUNITY): Payer: Self-pay | Admitting: Psychiatry

## 2011-03-04 DIAGNOSIS — F331 Major depressive disorder, recurrent, moderate: Secondary | ICD-10-CM

## 2011-03-06 ENCOUNTER — Encounter (HOSPITAL_BASED_OUTPATIENT_CLINIC_OR_DEPARTMENT_OTHER): Payer: Self-pay | Admitting: Marriage and Family Therapist

## 2011-03-06 DIAGNOSIS — F411 Generalized anxiety disorder: Secondary | ICD-10-CM

## 2011-03-06 DIAGNOSIS — F339 Major depressive disorder, recurrent, unspecified: Secondary | ICD-10-CM

## 2011-03-08 ENCOUNTER — Ambulatory Visit (INDEPENDENT_AMBULATORY_CARE_PROVIDER_SITE_OTHER): Payer: Self-pay | Admitting: Internal Medicine

## 2011-03-08 ENCOUNTER — Encounter: Payer: Self-pay | Admitting: Internal Medicine

## 2011-03-08 DIAGNOSIS — M549 Dorsalgia, unspecified: Secondary | ICD-10-CM

## 2011-03-08 DIAGNOSIS — G8929 Other chronic pain: Secondary | ICD-10-CM

## 2011-03-08 DIAGNOSIS — R1310 Dysphagia, unspecified: Secondary | ICD-10-CM | POA: Insufficient documentation

## 2011-03-08 DIAGNOSIS — K219 Gastro-esophageal reflux disease without esophagitis: Secondary | ICD-10-CM

## 2011-03-08 DIAGNOSIS — F329 Major depressive disorder, single episode, unspecified: Secondary | ICD-10-CM

## 2011-03-08 MED ORDER — HYOSCYAMINE SULFATE ER 0.375 MG PO TB12: 0.3750 mg | ORAL_TABLET | Freq: Two times a day (BID) | ORAL | Status: DC | PRN

## 2011-03-08 NOTE — Patient Instructions (Signed)
Esophagitis (Heartburn) Esophagitis (heartburn) is a painful, burning sensation in the chest. It may feel worse in certain positions, such as lying down or bending over. It is caused by stomach acid backing up into the tube that carries food from the mouth down to the stomach (lower esophagus). TREATMENT There are a number of non-prescription medicines used to treat heartburn, including:  Antacids.   Acid reducers (also called H-2 blockers).   Proton-pump inhibitors.  HOME CARE INSTRUCTIONS  Raise the head of your bead by putting blocks under the legs.   Eat 2-3 hours before going to bed.   Stop smoking.   Try to reach and maintain a healthy weight.   Do not eat just a few very large meals. Instead, eat many smaller meals throughout the day.   Try to identify foods and beverages that make your symptoms worse, and avoid these.   Avoid tight clothing.   Do not exercise right after eating.  SEEK IMMEDIATE MEDICAL CARE IF YOU:  Have severe chest pain that goes down your arm, or into your jaw or neck.   Feel sweaty, dizzy, or lightheaded.   Are short of breath.   Throw up (vomit) blood.   Have difficulty or pain with swallowing.   Have bloody or black, tarry stools.   Have bouts of heartburn more than three times a week for more than two weeks.  Document Released: 09/05/2004 Document Re-Released: 10/23/2009 [employer] Patient Information 2011 [employer] 

## 2011-03-08 NOTE — Progress Notes (Signed)
Subjective:    Patient ID: Dennis Conley, male    DOB: 1960/09/21, 50 y.o.   MRN: [id number]  Heartburn He complains of belching, choking, dysphagia, heartburn and nausea. He reports no abdominal pain, no chest pain, no coughing, no early satiety, no globus sensation, no hoarse voice, no sore throat, no stridor, no tooth decay, no water brash or no wheezing. This is a recurrent problem. The current episode started more than 1 year ago. The problem occurs occasionally. The problem has been gradually worsening. The heartburn duration is several minutes. The heartburn is located in the substernum. The heartburn is of moderate intensity. The heartburn does not wake him from sleep. The heartburn does not limit his activity. The heartburn doesn't change with position. The symptoms are aggravated by nothing. Pertinent negatives include no anemia, fatigue, muscle weakness, orthopnea or weight loss. He has tried a PPI for the symptoms. The treatment provided mild relief.  Back Pain This is a chronic problem. The current episode started more than 1 year ago. The problem occurs daily. The problem is unchanged. The pain is present in the lumbar spine. The quality of the pain is described as aching. The pain does not radiate. The pain is at a severity of 5/10. The pain is mild. The pain is worse during the day. The symptoms are aggravated by sitting. Pertinent negatives include no abdominal pain, bladder incontinence, bowel incontinence, chest pain, dysuria, fever, headaches, leg pain, numbness, paresis, paresthesias, pelvic pain, perianal numbness, tingling, weakness or weight loss. He has tried analgesics (he has been seeing an ortho doctor (?named [facility]) and a pain specialist)) for the symptoms. The treatment provided moderate relief.      Review of Systems  Constitutional: Positive for unexpected weight change (weight gain). Negative for fever, chills, weight loss, diaphoresis, activity change, appetite change  and fatigue.  HENT: Negative for sore throat, hoarse voice, facial swelling, trouble swallowing, neck pain, neck stiffness and voice change.   Eyes: Negative.   Respiratory: Positive for choking. Negative for apnea, cough, chest tightness, shortness of breath, wheezing and stridor.   Cardiovascular: Negative for chest pain, palpitations and leg swelling.  Gastrointestinal: Positive for heartburn, dysphagia, nausea and constipation. Negative for vomiting, abdominal pain, diarrhea, blood in stool, abdominal distention, anal bleeding, rectal pain and bowel incontinence.  Genitourinary: Negative for bladder incontinence, dysuria, urgency, frequency, hematuria, flank pain, decreased urine volume, enuresis, difficulty urinating, genital sores and pelvic pain.  Musculoskeletal: Positive for back pain. Negative for myalgias, joint swelling, arthralgias, gait problem and muscle weakness.  Skin: Negative for color change, pallor, rash and wound.  Neurological: Negative for dizziness, tingling, tremors, seizures, syncope, facial asymmetry, speech difficulty, weakness, light-headedness, numbness, headaches and paresthesias.  Hematological: Negative for adenopathy. Does not bruise/bleed easily.  Psychiatric/Behavioral: Negative.        Objective:   Physical Exam  Vitals reviewed. Constitutional: He appears well-developed and well-nourished. No distress.  HENT:  Head: Normocephalic and atraumatic.  Right Ear: External ear normal.  Left Ear: External ear normal.  Nose: Nose normal.  Mouth/Throat: Oropharynx is clear and moist. No oropharyngeal exudate.  Eyes: Conjunctivae and EOM are normal. Pupils are equal, round, and reactive to light. Right eye exhibits no discharge. Left eye exhibits no discharge. No scleral icterus.  Neck: Normal range of motion. Neck supple. No JVD present. No tracheal deviation present. No thyromegaly present.  Cardiovascular: Normal rate, regular rhythm, normal heart sounds and  intact distal pulses.  Exam reveals no gallop and no  friction rub.   No murmur heard. Pulmonary/Chest: Effort normal and breath sounds normal. No stridor. No respiratory distress. He has no wheezes. He has no rales. He exhibits no tenderness.  Abdominal: Soft. Bowel sounds are normal. He exhibits no distension and no mass. There is no tenderness. There is no rebound and no guarding.  Musculoskeletal:       Lumbar back: Normal. He exhibits normal range of motion, no tenderness, no bony tenderness, no swelling, no edema, no deformity, no laceration, no pain and no spasm.  Lymphadenopathy:    He has no cervical adenopathy.  Neurological: He is alert. He has normal strength. He is not disoriented. He displays no atrophy, no tremor and normal reflexes. No cranial nerve deficit or sensory deficit. He exhibits normal muscle tone. He displays a negative Romberg sign. He displays no seizure activity. Coordination and gait normal. He displays no Babinski's sign on the right side. He displays no Babinski's sign on the left side.  Reflex Scores:      Tricep reflexes are 1+ on the right side and 1+ on the left side.      Bicep reflexes are 1+ on the right side and 1+ on the left side.      Brachioradialis reflexes are 1+ on the right side and 1+ on the left side.      Patellar reflexes are 2+ on the right side and 2+ on the left side.      Achilles reflexes are 2+ on the right side and 2+ on the left side. Skin: Skin is warm and dry. No rash noted. He is not diaphoretic. No erythema. No pallor.  Psychiatric: He has a normal mood and affect. His behavior is normal. Judgment and thought content normal.      Lab Results  Component Value Date   WBC 5.6 01/23/2011   HGB 15.0 01/23/2011   HCT 43.5 01/23/2011   PLT 192.0 01/23/2011   CHOL 309* 01/23/2011   TRIG 135.0 01/23/2011   HDL 48.90 01/23/2011   LDLDIRECT 240.6 01/23/2011   ALT 33 01/23/2011   AST 20 01/23/2011   NA 142 01/23/2011   K 4.0 01/23/2011   CL  107 01/23/2011   CREATININE 1.1 01/23/2011   BUN 8 01/23/2011   CO2 26 01/23/2011   TSH 0.93 01/23/2011   INR 1.06 09/12/2010      Assessment & Plan:   No problem-specific assessment & plan notes found for this encounter.

## 2011-03-09 ENCOUNTER — Encounter: Payer: Self-pay | Admitting: Internal Medicine

## 2011-03-09 NOTE — Assessment & Plan Note (Signed)
Start levbid and continue the PPI, will also refer to GI as he may need upper endoscopy

## 2011-03-09 NOTE — Assessment & Plan Note (Signed)
Start levbid and ask for referral

## 2011-03-09 NOTE — Assessment & Plan Note (Signed)
No changes

## 2011-03-11 ENCOUNTER — Encounter: Payer: Self-pay | Admitting: Internal Medicine

## 2011-03-12 ENCOUNTER — Encounter (HOSPITAL_COMMUNITY): Payer: Self-pay | Admitting: Marriage and Family Therapist

## 2011-03-12 DIAGNOSIS — F339 Major depressive disorder, recurrent, unspecified: Secondary | ICD-10-CM

## 2011-03-19 ENCOUNTER — Encounter (HOSPITAL_BASED_OUTPATIENT_CLINIC_OR_DEPARTMENT_OTHER): Payer: Self-pay | Admitting: Marriage and Family Therapist

## 2011-03-19 DIAGNOSIS — F339 Major depressive disorder, recurrent, unspecified: Secondary | ICD-10-CM

## 2011-03-19 DIAGNOSIS — F411 Generalized anxiety disorder: Secondary | ICD-10-CM

## 2011-03-26 ENCOUNTER — Encounter (HOSPITAL_COMMUNITY): Payer: Self-pay | Admitting: Marriage and Family Therapist

## 2011-04-01 ENCOUNTER — Other Ambulatory Visit: Payer: Self-pay

## 2011-04-01 DIAGNOSIS — G8929 Other chronic pain: Secondary | ICD-10-CM

## 2011-04-01 NOTE — Telephone Encounter (Signed)
Pt called requesting refill of Pain meds

## 2011-04-04 ENCOUNTER — Ambulatory Visit (INDEPENDENT_AMBULATORY_CARE_PROVIDER_SITE_OTHER): Payer: Self-pay | Admitting: Internal Medicine

## 2011-04-04 ENCOUNTER — Telehealth: Payer: Self-pay | Admitting: *Deleted

## 2011-04-04 ENCOUNTER — Encounter: Payer: Self-pay | Admitting: Internal Medicine

## 2011-04-04 DIAGNOSIS — K5900 Constipation, unspecified: Secondary | ICD-10-CM

## 2011-04-04 DIAGNOSIS — K219 Gastro-esophageal reflux disease without esophagitis: Secondary | ICD-10-CM

## 2011-04-04 DIAGNOSIS — M549 Dorsalgia, unspecified: Secondary | ICD-10-CM

## 2011-04-04 DIAGNOSIS — R1310 Dysphagia, unspecified: Secondary | ICD-10-CM

## 2011-04-04 NOTE — Telephone Encounter (Signed)
Patient requesting RF of pain med. See previous RF REQUEST from 8/20. MD refused RF b/c pt needs f/u OV. Pt called back today req status of req and to discuss forms. Need to call pt to inform that he needs f/u OV.

## 2011-04-04 NOTE — Telephone Encounter (Signed)
Please advise on refill, pt last seen 03/08/11 and last rx written on 03/01/11. Appt wiith pain mangt not until 05/14/11 per Epic. Thanks

## 2011-04-04 NOTE — Patient Instructions (Signed)
You can stop your Levbid. You have been scheduled for an Endoscopy. We will get your records from [place] and get timing of your next Colonoscopy. Please start Miralax daily, if you develop loose stools you may decrease. Coupons have been given. Start your Prilosec daily instead of as needed.

## 2011-04-04 NOTE — Progress Notes (Addendum)
Subjective:    Patient ID: Dennis Conley, male    DOB: 1960-12-13, 50 y.o.   MRN: [id number]  HPI [name] is a 50 year old male with a past medical history of hypertension, hyperlipidemia, depression, chronic neck and back pain, and GERD who is referred by Dr. Yetta Barre for evaluation of dysphagia. He is alone today.  The patient reports solid food dysphagia which started 3-4 weeks ago. He is also had difficulty with swallowing pills, but not with liquids. He denies a history of a true impaction, but does state that he feels foods get stuck and he has to force them down with liquids. He also describes globus sensation at present. He does not have it odynophagia. He has not lost weight. He denies nausea and vomiting. He also denies abdominal pain. He does report heartburn occurring 2-3 days a week and he reports using Prilosec on an as-needed basis.  He is not taking Prilosec daily. He does think that this helped some. He also wonders if his dysphasia is related to his cervical neck surgery performed in January 2012 (though he notes no immediate symptoms following surgery). He does report occasional lower abdominal cramping which he relates to constipation. He reports he still struggles with constipation and has so for the last 10 years.  He reports having a bowel movement 1-2 days per week. On days when he does not have a bowel movement he does have bloating. At present he is not using anything for constipation but in the past reports using senna. At times he felt senna caused loose stools. He did have a colonoscopy recently within the past one to 2 years in [place]. He does not recall polyps on this exam.  He also states that he has been told he has delayed stomach emptying and underwent what sounds most like a solid phase gastric emptying study also done in [place].    Review of Systems Constitutional: Negative for fever, chills, night sweats, activity change, appetite change and unexpected weight  change HEENT: Negative for sore throat, mouth sores and trouble swallowing. Eyes: Negative for visual disturbance Respiratory: Negative for cough, chest tightness and shortness of breath Cardiovascular: Negative for chest pain, palpitations and lower extremity swelling Gastrointestinal: See history of present illness Genitourinary: Negative for dysuria and hematuria. Musculoskeletal: chronic back, neck, and left arm/leg pain,  No myalgias Skin: Negative for rash or color change Neurological: Negative for headaches, weakness, numbness Hematological: Negative for adenopathy, negative for easy bruising/bleeding Psychiatric/behavioral: Negative for depressed mood, + occasional anxiety  Past Medical History  Diagnosis Date  . ERECTILE DYSFUNCTION 10/29/2010  . Chronic headaches 10/29/2010  . HYPERLIPIDEMIA 10/29/2010  . OSTEOARTHRITIS 10/29/2010  . DEPRESSION 10/29/2010    Dr Dub Mikes  . IBS (irritable bowel syndrome)   . GERD (gastroesophageal reflux disease)   . Anxiety   . Hypertension     Current outpatient prescriptions:amitriptyline (ELAVIL) 50 MG tablet, Take 50 mg by mouth.  , Disp: , Rfl: ;  clonazePAM (KLONOPIN) 2 MG tablet, Take 2 mg by mouth 2 (two) times daily.  , Disp: , Rfl: ;  DULoxetine (CYMBALTA) 60 MG capsule, Take 60 mg by mouth daily. , Disp: , Rfl: ;  gabapentin (NEURONTIN) 300 MG capsule, 3 tablets 3 times per day , Disp: , Rfl:  nebivolol (BYSTOLIC) 10 MG tablet, Take 1 tablet (10 mg total) by mouth daily., Disp: 154 tablet, Rfl: 0;  omeprazole (PRILOSEC) 40 MG capsule, Take 40 mg by mouth daily.  , Disp: ,  Rfl: ;  oxyCODONE-acetaminophen (PERCOCET) 10-325 MG per tablet, Take 1 tablet by mouth every 6 (six) hours as needed for pain., Disp: 120 tablet, Rfl: 0;  rosuvastatin (CRESTOR) 10 MG tablet, Take 1 tablet (10 mg total) by mouth at bedtime., Disp: 84 tablet, Rfl: 0 tadalafil (CIALIS) 20 MG tablet, Take 20 mg by mouth daily as needed.  , Disp: , Rfl:    Social History  .  Marital Status: Married    Number of Children: 5   Occupational History  . Disabled    Social History Main Topics  . Smoking status: Never Smoker   . Smokeless tobacco: Never Used  . Alcohol Use: No  . Drug Use: No  . Sexually Active: Yes   Social History Narrative   3 caffeine drinks daily     Family History  Problem Relation Age of Onset  . Alcohol abuse Other   . Arthritis Other   . Hypertension Other   . Hyperlipidemia Other   . Cancer Other     prostate cancer, 1st degree relative   . Stroke Other     1st degree relative  . Colon cancer Maternal Grandfather 15  . Diabetes Mother     and Father  . Heart disease      Father side   . Colon cancer  48    Cousin on moms side      Objective:   Physical Exam BP 132/80  Pulse 64  Ht 6\' 2"  (1.88 m)  Wt 217 lb (98.431 kg)  BMI 27.86 kg/m2 Constitutional: Well-developed and well-nourished. No distress. HEENT: Normocephalic and atraumatic. Oropharynx is clear and moist. No oropharyngeal exudate. Conjunctivae are normal. Pupils are equal round and reactive to light. No scleral icterus. Neck: Neck supple. Trachea midline. Cardiovascular: Normal rate, regular rhythm and intact distal pulses. No M/R/G Pulmonary/chest: Effort normal and breath sounds normal. No wheezing, rales or rhonchi. Abdominal: Soft, nontender, nondistended. There are no masses palpable. No hepatosplenomegaly. Lymphadenopathy: No cervical adenopathy noted. Neurological: Alert and oriented to person place and time. Skin: Skin is warm and dry. No rashes noted. Psychiatric: Normal mood and affect. Behavior is normal.  Labs: reviewed in Epic.     Assessment & Plan:  50 year old male with a past medical history of hypertension, hyperlipidemia, depression, chronic neck and back pain, and GERD who is referred by Dr. Yetta Barre for evaluation of dysphagia.  1. GERD/dysphagia - the patient does have along history of reflux disease and given his dysphasia I will  perform an EGD. We discussed the need for possible dilation this can be done if necessary. We did discuss PPI therapy and I have recommended that this is best taken daily 30 minutes to one hour before his first meal. He is using this on a when necessary basis and likely not getting maximum benefit. He will give a trial of daily therapy. If at endoscopy there is no evidence of esophagitis or stricture, then random biopsies will be performed to exclude eosinophilic esophagitis.  2. Constipation - patient has long-standing history of constipation. I will prescribe MiraLAX 17 g daily. We did discuss that he can titrate this dose if necessary. I have advised that his stools are too loose he can take this every other day. He can use hyoscyamine when necessary for cramping.  I have requested the results of his colonoscopy done in the past several years to determine when he will be due a repeat for [facility] screening/surveillance.  3. ? Gastroparesis - it  sounds as if he's been diagnosed previously with gastroparesis. This does not seem to be an issue for him at present and therefore no medical therapy is felt necessary at this time.  Addendum: Records received from screening colonoscopy date 10/05/2008 Findings revealed internal hemorrhoids otherwise normal with "excellent prep" Recommendation: Repeat in 5 years due to family history of colon cancer (February 2015)

## 2011-04-05 MED ORDER — OXYCODONE-ACETAMINOPHEN 10-325 MG PO TABS: 1.0000 | ORAL_TABLET | Freq: Four times a day (QID) | ORAL | Status: DC | PRN

## 2011-04-05 NOTE — Telephone Encounter (Signed)
done

## 2011-04-05 NOTE — Telephone Encounter (Signed)
Patient notified

## 2011-04-09 DIAGNOSIS — Z0279 Encounter for issue of other medical certificate: Secondary | ICD-10-CM

## 2011-04-12 NOTE — Telephone Encounter (Signed)
Medication refill at recent OV with MD

## 2011-04-17 ENCOUNTER — Encounter (INDEPENDENT_AMBULATORY_CARE_PROVIDER_SITE_OTHER): Payer: Self-pay | Admitting: Marriage and Family Therapist

## 2011-04-17 ENCOUNTER — Encounter (INDEPENDENT_AMBULATORY_CARE_PROVIDER_SITE_OTHER): Payer: Self-pay | Admitting: Psychiatry

## 2011-04-17 DIAGNOSIS — F339 Major depressive disorder, recurrent, unspecified: Secondary | ICD-10-CM

## 2011-04-17 DIAGNOSIS — F329 Major depressive disorder, single episode, unspecified: Secondary | ICD-10-CM

## 2011-04-17 DIAGNOSIS — F3289 Other specified depressive episodes: Secondary | ICD-10-CM

## 2011-04-18 ENCOUNTER — Telehealth: Payer: Self-pay | Admitting: *Deleted

## 2011-04-18 NOTE — Telephone Encounter (Signed)
[employer] disability called - They have medical records and state that MD has written that patient has a normal physical exam, no statement about impairment in his gait, only chronic LBP. He has sedentary job and told disability company that he is no longer following up with ortho or pain management. This makes Dr Yetta Barre as the main MD to determine if he qualifies for disability. They need more information to support his disability claim. Please advise.

## 2011-04-18 NOTE — Telephone Encounter (Signed)
Left detailed vm for disability RN

## 2011-04-18 NOTE — Telephone Encounter (Signed)
I do not have any additional info, he will have to go back to work

## 2011-04-22 ENCOUNTER — Encounter: Payer: Self-pay | Attending: Physical Medicine and Rehabilitation | Admitting: Physical Medicine and Rehabilitation

## 2011-04-22 DIAGNOSIS — F329 Major depressive disorder, single episode, unspecified: Secondary | ICD-10-CM | POA: Insufficient documentation

## 2011-04-22 DIAGNOSIS — M545 Low back pain, unspecified: Secondary | ICD-10-CM | POA: Insufficient documentation

## 2011-04-22 DIAGNOSIS — G8929 Other chronic pain: Secondary | ICD-10-CM | POA: Insufficient documentation

## 2011-04-22 DIAGNOSIS — M79609 Pain in unspecified limb: Secondary | ICD-10-CM | POA: Insufficient documentation

## 2011-04-22 DIAGNOSIS — G894 Chronic pain syndrome: Secondary | ICD-10-CM

## 2011-04-22 DIAGNOSIS — E785 Hyperlipidemia, unspecified: Secondary | ICD-10-CM | POA: Insufficient documentation

## 2011-04-22 DIAGNOSIS — Z981 Arthrodesis status: Secondary | ICD-10-CM | POA: Insufficient documentation

## 2011-04-22 DIAGNOSIS — F411 Generalized anxiety disorder: Secondary | ICD-10-CM | POA: Insufficient documentation

## 2011-04-22 DIAGNOSIS — F3289 Other specified depressive episodes: Secondary | ICD-10-CM | POA: Insufficient documentation

## 2011-04-22 DIAGNOSIS — R209 Unspecified disturbances of skin sensation: Secondary | ICD-10-CM

## 2011-04-22 DIAGNOSIS — Z79899 Other long term (current) drug therapy: Secondary | ICD-10-CM | POA: Insufficient documentation

## 2011-04-22 NOTE — Progress Notes (Signed)
[name] is a pleasant 50 year old married gentleman who was kindly referred by Dr. [name].  [name] comes to our clinic with a long history of low back pain which he tells me dates back to in his late teens.  He started having back pain at that time.  He had first surgery in his late 22s or early 59s.  He also mentions that he has had some cervical spine surgery last year.  It is my understanding he has had cervical fusion in 2012.  He is unsure which levels and I do not have notes regarding this procedure.  He has had spinal cord stimulator trial.  He tells me that he tried it for about 2 months, it stopped working.  He still has the device implanted, but it is not functional at this time.  He is interested in having it removed, but does not have insurance to do so.  He has been followed by Dr. Yevette Conley and has had selective nerve root blocks at left L3-4 which apparently have not been successful with respect to his pain management.  He has been trialed on multiple medications over the years.  He has been on morphine, he has been on Dilaudid, and more recently he has been using Percocet 4 times a day in addition to gabapentin 600 mg twice a day and then 2 tablets at night.  He is also on Cymbalta as well as Elavil and clonazepam.  His average pain is between 8-9 on a scale of 10.  His sleep is fair. Pain is worse with activity and bending.  Walking and bending exacerbates his pain.  It improves with rest and medication.  He reports fair to good relief with the current medications that he is taking.  Medications that he is on currently include Percocet, Neurontin, Bystolic, Crestor, Elavil, clonazepam, and Cymbalta.  FUNCTIONAL STATUS:  He can walk about 2 minutes per his report.  He can climb stairs.  He is driving.  He is independent with self-care. Occasionally, he needs some help getting his shoes on.  He does drive his children to school and helps out with  activities around the house.  REVIEW OF SYSTEMS:  He denies problems controlling bowel or bladder.  He does report some irritable bowel symptoms.  He admits to some weakness, numbness, trouble walking, depression, and anxiety.  Denies suicidal ideation.  Also notable review of systems for occasional limb swelling.  Physicians currently involved in his care include Dr. Yetta Conley, Dr. Yevette Conley, and Dr. Lolly Conley.  He has No Known Drug Allergies.  PAST MEDICAL HISTORY:  Notable for: 1. Depression. 2. Erectile dysfunction. 3. Headache. 4. Hyperlipidemia.  PAST SURGICAL HISTORY:  1999 first laminectomy for right sciatica, 2006 two-level fusion and 2007 redo for loose screw.  He had cervical spine surgery in 2012 and he had a spinal cord stimulator placed in 2011 as well.  SOCIAL HISTORY:  The patient lives with his wife and has 5 children, age 42, 20, 1, 72, and 16.  He denies illegal substance use.  He denies alcohol use or smoking.  FAMILY HISTORY:  Positive for heart disease, diabetes, high blood pressure, and psychiatric problems.  He incidentally notes that two his children also typically walk on their toes.  PHYSICAL EXAMINATION:  Today, blood pressure is 125/68, pulse 80, respirations 18, and 99% saturated on room air.  He is a well-developed, well-nourished gentleman.  He is oriented x3.  Speech is clear.  His affect is bright.  He is alert, cooperative, and pleasant.  He follows commands without difficulty.  He answers my questions appropriately. Cranial nerves and coordination are intact.  Specifically, finger-nose- finger as well as rapid movements were tested.  His reflexes are brisk in both upper and lower extremities.  Hoffman sign is negative in the upper extremities.  He has 2 beats of clonus in the lower extremities.  Motor strength, however, is quite good in the both upper and lower extremities without obvious deficit except for some very mild weakness of right EHL.   He has decreased sensation in the left thigh as well as into the medial and dorsal aspect of the left foot.  He transitions from sitting to standing without difficulty.  His gait is slow.  Tandem gait is performed relatively well.  Romberg test is also performed without difficulty.  He has limitations in cervical range of motion in all planes.  He has full shoulder range of motion.  He has limitations in lumbar motion.  He has well-healed scar over the left anterior neck.  He has a well-healed midthoracic scar as well as lumbar scar and left lateral flank scar.  The spinal cord stimulator is palpated in the left flank.  IMPRESSION: 1. 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 2. History of depression and anxiety.  PLAN:  I discussed the various treatment options with [name] today regarding medication adjustments.  At this point, we are considering adjusting his gabapentin dosage to 400 mg possibly q.6 h. to give a better coverage than he has currently.  He is currently using Percocet 10/325 qid.  He may consider continuing this medication, although he is open to longer-acting medication as well.  We will await urine drug screen.  I have answered all of his questions.  He is currently comfortable with the plan.  I will see him back in 3 weeks.     Brantley Stage, M.D. Electronically Signed    DMK/MedQ D:  04/22/2011 10:05:04  T:  04/22/2011 10:51:21  Job #:  [id number]

## 2011-04-29 ENCOUNTER — Telehealth: Payer: Self-pay | Admitting: Internal Medicine

## 2011-04-29 NOTE — Telephone Encounter (Signed)
No thanks  

## 2011-04-30 ENCOUNTER — Telehealth: Payer: Self-pay | Admitting: *Deleted

## 2011-04-30 ENCOUNTER — Other Ambulatory Visit: Payer: Self-pay | Admitting: Internal Medicine

## 2011-04-30 DIAGNOSIS — G8929 Other chronic pain: Secondary | ICD-10-CM

## 2011-04-30 NOTE — Telephone Encounter (Signed)
Pt requesting refill on Percocet 10 -325 SIG 1 every 6 hours prn. Last fill was 04/05/2011 Please Advise refills

## 2011-05-01 MED ORDER — OXYCODONE-ACETAMINOPHEN 10-325 MG PO TABS: 1.0000 | ORAL_TABLET | Freq: Four times a day (QID) | ORAL | Status: DC | PRN

## 2011-05-01 NOTE — Telephone Encounter (Signed)
Rx was written.

## 2011-05-02 ENCOUNTER — Telehealth: Payer: Self-pay

## 2011-05-02 ENCOUNTER — Encounter (INDEPENDENT_AMBULATORY_CARE_PROVIDER_SITE_OTHER): Payer: Self-pay | Admitting: Marriage and Family Therapist

## 2011-05-02 DIAGNOSIS — F339 Major depressive disorder, recurrent, unspecified: Secondary | ICD-10-CM

## 2011-05-02 DIAGNOSIS — F411 Generalized anxiety disorder: Secondary | ICD-10-CM

## 2011-05-02 NOTE — Telephone Encounter (Signed)
Patient called [facility] checking status of percocet refill.

## 2011-05-02 NOTE — Telephone Encounter (Signed)
Pt picked up prescription.

## 2011-05-02 NOTE — Telephone Encounter (Signed)
Patient notified to pick up 

## 2011-05-08 ENCOUNTER — Encounter: Payer: Self-pay | Admitting: Internal Medicine

## 2011-05-08 ENCOUNTER — Telehealth: Payer: Self-pay | Admitting: *Deleted

## 2011-05-08 ENCOUNTER — Ambulatory Visit (AMBULATORY_SURGERY_CENTER): Payer: Self-pay | Admitting: Internal Medicine

## 2011-05-08 DIAGNOSIS — K219 Gastro-esophageal reflux disease without esophagitis: Secondary | ICD-10-CM

## 2011-05-08 DIAGNOSIS — R1310 Dysphagia, unspecified: Secondary | ICD-10-CM

## 2011-05-08 DIAGNOSIS — K209 Esophagitis, unspecified: Secondary | ICD-10-CM

## 2011-05-08 DIAGNOSIS — K2940 Chronic atrophic gastritis without bleeding: Secondary | ICD-10-CM

## 2011-05-08 MED ORDER — SODIUM CHLORIDE 0.9 % IV SOLN: 500.0000 mL | INTRAVENOUS | Status: DC

## 2011-05-08 NOTE — Telephone Encounter (Signed)
Contacted Dennis Conley who will put in a recall for COLON in FEB 2015.

## 2011-05-08 NOTE — Patient Instructions (Addendum)
Green and blue discharge instructions reviewed with patient and care partner.  Impressions/Recommendations:  Mild gastritis  Continue current medications including Omeprazole 40 mg. Omeprazole is best taken 30 minutes to 1 hour prior to your 1st meal of the day.

## 2011-05-08 NOTE — Telephone Encounter (Signed)
Message copied by Florene Glen on Wed May 08, 2011  2:02 PM ------      Message from: Beverley Fiedler      Created: Wed May 08, 2011  9:16 AM      Regarding: Recall Colon       Can you place (or see if one is already there) for recall colon Feb 2015. Thanks

## 2011-05-09 ENCOUNTER — Telehealth: Payer: Self-pay

## 2011-05-09 NOTE — Telephone Encounter (Signed)

## 2011-05-13 ENCOUNTER — Ambulatory Visit: Payer: Self-pay | Admitting: Physical Medicine and Rehabilitation

## 2011-05-14 ENCOUNTER — Ambulatory Visit: Payer: Self-pay | Admitting: Neurosurgery

## 2011-05-15 ENCOUNTER — Encounter: Payer: Self-pay | Admitting: Physical Medicine and Rehabilitation

## 2011-05-15 ENCOUNTER — Telehealth: Payer: Self-pay | Admitting: *Deleted

## 2011-05-15 ENCOUNTER — Other Ambulatory Visit: Payer: Self-pay | Admitting: Internal Medicine

## 2011-05-15 MED ORDER — FLUTICASONE PROPIONATE HFA 220 MCG/ACT IN AERO: INHALATION_SPRAY | RESPIRATORY_TRACT | Status: DC

## 2011-05-15 NOTE — Telephone Encounter (Signed)
Message copied by Florene Glen on Wed May 15, 2011  9:16 AM ------      Message from: Beverley Fiedler      Created: Tue May 14, 2011  5:10 PM       Please let patient know pathology results reveal eosinophilic esophagitis      This is the likely explanation for his dysphagia      Should continue his PPI daily, 30 minutes before breakfast      Also recommend adding fluticasone 200 mcg 2 sprays without a spacer into mouth and then swallowed twice a day      He should avoid E. or drinking for at least 30 minutes after each dose      Take the fluticasone for 6-8 weeks and follow up with me in 4 weeks to assess response      thanks

## 2011-05-15 NOTE — Telephone Encounter (Signed)
Informed pt he has Eosinophilic Esophagitis and he needs to continue his PPI daily, 30 min. Prior to breakfast. He also needs Fluticasone 2 sprays into his mouth w/o a spacer and then swallowed bid. Avoid eating and drinking at least . After each dose. Do this for 6-8 weeks- f/u with Dr [name] in 4 weeks. Pt stated understanding.  Dr [name], can you look at my choices for this med- I see of Flonase, but not ? Thanks.

## 2011-05-16 ENCOUNTER — Encounter (INDEPENDENT_AMBULATORY_CARE_PROVIDER_SITE_OTHER): Payer: Self-pay | Admitting: Marriage and Family Therapist

## 2011-05-16 ENCOUNTER — Telehealth: Payer: Self-pay | Admitting: Internal Medicine

## 2011-05-16 DIAGNOSIS — F411 Generalized anxiety disorder: Secondary | ICD-10-CM

## 2011-05-16 DIAGNOSIS — F339 Major depressive disorder, recurrent, unspecified: Secondary | ICD-10-CM

## 2011-05-16 NOTE — Telephone Encounter (Signed)
I ordered the med Please inform pt Thank you.

## 2011-05-16 NOTE — Telephone Encounter (Signed)
Notified pt Dr [name] ordered his med at [facility]. I also mailed him some info on Eosinophilic Esophagitis. Pt will call for questions.

## 2011-05-17 MED ORDER — FLUTICASONE PROPIONATE (INHAL) 250 MCG/BLIST IN AEPB: INHALATION_SPRAY | RESPIRATORY_TRACT | Status: DC

## 2011-05-17 NOTE — Telephone Encounter (Signed)
Spoke with Dr [name] and unfortunately there is no substitute for Flovent. Pt reports he has no medicine insurance and each Flovent inhaler cost $250.

## 2011-05-17 NOTE — Telephone Encounter (Signed)
Notified pt we could give him 2 sample packs of the inhalers; dose is higher, but ok with Dr [name].

## 2011-05-24 ENCOUNTER — Ambulatory Visit: Payer: Self-pay | Admitting: Physical Medicine and Rehabilitation

## 2011-05-29 ENCOUNTER — Encounter (HOSPITAL_COMMUNITY): Payer: Self-pay | Admitting: Marriage and Family Therapist

## 2011-05-29 ENCOUNTER — Telehealth: Payer: Self-pay | Admitting: *Deleted

## 2011-05-29 DIAGNOSIS — F339 Major depressive disorder, recurrent, unspecified: Secondary | ICD-10-CM

## 2011-05-29 DIAGNOSIS — M549 Dorsalgia, unspecified: Secondary | ICD-10-CM

## 2011-05-29 MED ORDER — OXYCODONE-ACETAMINOPHEN 10-325 MG PO TABS: 1.0000 | ORAL_TABLET | Freq: Four times a day (QID) | ORAL | Status: DC | PRN

## 2011-05-29 NOTE — Telephone Encounter (Signed)
done

## 2011-05-29 NOTE — Telephone Encounter (Signed)
Left detailed mess informing pt Rx ready for p/u.

## 2011-05-29 NOTE — Telephone Encounter (Signed)
Pt left vm requesting Rf on Percocet. Please advise

## 2011-05-31 ENCOUNTER — Encounter: Payer: Self-pay | Attending: Physical Medicine and Rehabilitation | Admitting: Physical Medicine and Rehabilitation

## 2011-05-31 DIAGNOSIS — G894 Chronic pain syndrome: Secondary | ICD-10-CM

## 2011-05-31 DIAGNOSIS — F3289 Other specified depressive episodes: Secondary | ICD-10-CM | POA: Insufficient documentation

## 2011-05-31 DIAGNOSIS — F411 Generalized anxiety disorder: Secondary | ICD-10-CM | POA: Insufficient documentation

## 2011-05-31 DIAGNOSIS — M79609 Pain in unspecified limb: Secondary | ICD-10-CM

## 2011-05-31 DIAGNOSIS — G8929 Other chronic pain: Secondary | ICD-10-CM | POA: Insufficient documentation

## 2011-05-31 DIAGNOSIS — M545 Low back pain, unspecified: Secondary | ICD-10-CM | POA: Insufficient documentation

## 2011-05-31 DIAGNOSIS — Z79899 Other long term (current) drug therapy: Secondary | ICD-10-CM | POA: Insufficient documentation

## 2011-05-31 DIAGNOSIS — F329 Major depressive disorder, single episode, unspecified: Secondary | ICD-10-CM | POA: Insufficient documentation

## 2011-06-01 NOTE — Assessment & Plan Note (Signed)
[name] is a very pleasant 50 year old gentleman who is a patient of Dr. [name].  Dennis Conley was initially seen by me on April 22, 2011.  Dennis Conley has a history of low back pain and chronic left lower extremity pain.  Dennis Conley has a history of multiple lumbar surgeries including a spinal cord stimulator trial.  Dennis Conley has been managed on narcotic pain medications.  Dennis Conley currently is using Percocet 10/325 four times a day, but Dennis Conley has concerns that this is not covering him.  Dennis Conley had been asking about something longer acting.  Dennis Conley also uses gabapentin which Dennis Conley finds to be beneficial as well.  Dennis Conley takes 600 four times a day.  His average pain is about 8-10 on a scale of 10 predominantly located in the low back, radiating to the left lower extremity, worse with activity such as walking, bending, and sometimes sitting, improves with rest and medication.  Dennis Conley gets a little relief with current meds.  FUNCTIONAL STATUS:  Dennis Conley can walk 1-2 minutes.  Dennis Conley is able to climb stairs and drive.  Dennis Conley is independent with self-care. Dennis Conley denies problems controlling bowel or bladder.  Admits to numbness, trouble walking, depression, anxiety, but denies suicidal ideation.  Dennis Conley reports weight gain, diarrhea, or constipation.  Dennis Conley follows up with primary care for these problems.  No changes in past medical social, or family history since last visit.  MEDICATIONS:  She currently takes for pain include Percocet 10/325 four times a day per Dr. Yetta Barre, gabapentin 600 mg one p.o. q.i.d., Elavil 50 mg one p.o. daily, clonazepam 1 mg b.i.d., and Cymbalta 60 mg daily.  Dennis Conley also sees Dr. Lolly Mustache who prescribed some of these medications.  PHYSICAL EXAMINATION:  VITAL SIGNS:  Blood pressure is 140/66, pulse 80, respirations 18, 95% saturated on room air. GENERAL:  Dennis Conley is a well-developed, well-nourished gentleman who does not appear in any distress. NEUROLOGICAL:  Dennis Conley is oriented times 3.  Speech is clear.  Affect is bright.  Dennis Conley is  alert, cooperative, and pleasant.  Follows commands without difficulty.  Answers my questions appropriately.  Cranial nerves, coordination are intact. MUSCULOSKELETAL:  Reflexes are brisk, upper and lower extremities.  No abnormal tone, clonus or Hoffmann is noted.  Motor strength is good bilateral upper and lower extremities without focal deficits.  Straight leg raise is negative. Transitioning from sitting to standing is done without difficulty.  Dennis Conley has limitations in lumbar motion especially with extension Dennis Conley has a well- healed scar in his lumbar spine.  Dennis Conley has patchy sensation in the left lower extremity.  IMPRESSION: 1. Chronic lower extremity pain, consistent with neuropathic pain,     history of multiple lumbar surgeries and failed spinal cord     stimulator trial. 2. History of depression and anxiety.  PLAN:  We will switch him from Percocet 10/325 four times a day to MS Contin 15 mg one p.o. b.i.d. to t.i.d.  We will continue his gabapentin 600 mg 1 p.o. q.a.m., 1 p.o. q.p.m., and 2 at bedtime.  Dennis Conley has signed the opiate consent form.  I reviewed risks and benefits of using opiate pain medications with him.  Dennis Conley would like to continue their use at this point.  I have answered all questions Dennis Conley is comfortable with this plan.  I will see him back in a month.  Continue to monitor his pill counts as well as random urine drug screens.  I have cautioned him regarding operating vehicles and machinery while on these  medicines.     Brantley Stage, M.D. Electronically Signed    DMK/MedQ D:  05/31/2011 14:07:38  T:  06/01/2011 01:35:35  Job #:  [id number]

## 2011-06-04 ENCOUNTER — Encounter: Payer: Self-pay | Admitting: Internal Medicine

## 2011-06-04 ENCOUNTER — Ambulatory Visit (INDEPENDENT_AMBULATORY_CARE_PROVIDER_SITE_OTHER): Payer: Self-pay | Admitting: Internal Medicine

## 2011-06-04 DIAGNOSIS — I10 Essential (primary) hypertension: Secondary | ICD-10-CM

## 2011-06-04 NOTE — Assessment & Plan Note (Signed)
His BP is well controlled, will continue current meds

## 2011-06-04 NOTE — Progress Notes (Signed)
  Subjective:    Patient ID: Dennis Conley, male    DOB: Dec 13, 1960, 50 y.o.   MRN: [id number]  Hypertension This is a chronic problem. The current episode started more than 1 year ago. The problem has been gradually improving since onset. The problem is controlled. Pertinent negatives include no anxiety, blurred vision, chest pain, headaches, malaise/fatigue, neck pain, orthopnea, palpitations, peripheral edema, PND, shortness of breath or sweats. Past treatments include beta blockers. The current treatment provides significant improvement. There are no compliance problems.       Review of Systems  Constitutional: Negative.  Negative for malaise/fatigue.  HENT: Negative.  Negative for neck pain.   Eyes: Negative for blurred vision.  Respiratory: Negative.  Negative for shortness of breath.   Cardiovascular: Negative.  Negative for chest pain, palpitations, orthopnea and PND.  Gastrointestinal: Negative.   Genitourinary: Negative.   Neurological: Negative.  Negative for headaches.       Objective:   Physical Exam  Vitals reviewed. Constitutional: He is oriented to person, place, and time. He appears well-developed and well-nourished. No distress.  HENT:  Head: Normocephalic and atraumatic.  Mouth/Throat: Oropharynx is clear and moist. No oropharyngeal exudate.  Eyes: Conjunctivae are normal. Right eye exhibits no discharge. Left eye exhibits no discharge. No scleral icterus.  Neck: Normal range of motion. Neck supple. No JVD present. No tracheal deviation present. No thyromegaly present.  Cardiovascular: Normal rate, regular rhythm, normal heart sounds and intact distal pulses.  Exam reveals no gallop and no friction rub.   No murmur heard. Pulmonary/Chest: Effort normal and breath sounds normal. No stridor. No respiratory distress. He has no wheezes. He has no rales. He exhibits no tenderness.  Abdominal: Soft. Bowel sounds are normal. He exhibits no distension and no mass. There is  no tenderness. There is no rebound and no guarding.  Musculoskeletal: Normal range of motion. He exhibits no edema and no tenderness.  Lymphadenopathy:    He has no cervical adenopathy.  Neurological: He is oriented to person, place, and time. He displays normal reflexes. He exhibits normal muscle tone. Coordination normal.  Skin: Skin is warm and dry. No rash noted. He is not diaphoretic. No erythema. No pallor.  Psychiatric: He has a normal mood and affect. His behavior is normal. Judgment and thought content normal.          Assessment & Plan:

## 2011-06-05 ENCOUNTER — Encounter (HOSPITAL_COMMUNITY): Payer: Self-pay | Admitting: Marriage and Family Therapist

## 2011-06-13 ENCOUNTER — Encounter: Payer: Self-pay | Admitting: Internal Medicine

## 2011-06-14 ENCOUNTER — Encounter (INDEPENDENT_AMBULATORY_CARE_PROVIDER_SITE_OTHER): Payer: Self-pay | Admitting: Psychiatry

## 2011-06-14 DIAGNOSIS — F331 Major depressive disorder, recurrent, moderate: Secondary | ICD-10-CM

## 2011-06-24 ENCOUNTER — Ambulatory Visit (INDEPENDENT_AMBULATORY_CARE_PROVIDER_SITE_OTHER): Payer: Self-pay | Admitting: Internal Medicine

## 2011-06-24 ENCOUNTER — Encounter: Payer: Self-pay | Admitting: Internal Medicine

## 2011-06-24 VITALS — BP 120/76 | HR 76 | Ht 74.0 in | Wt 236.2 lb

## 2011-06-24 DIAGNOSIS — K200 Eosinophilic esophagitis: Secondary | ICD-10-CM

## 2011-06-24 DIAGNOSIS — R140 Abdominal distension (gaseous): Secondary | ICD-10-CM

## 2011-06-24 DIAGNOSIS — K589 Irritable bowel syndrome without diarrhea: Secondary | ICD-10-CM

## 2011-06-24 DIAGNOSIS — K219 Gastro-esophageal reflux disease without esophagitis: Secondary | ICD-10-CM

## 2011-06-24 DIAGNOSIS — R143 Flatulence: Secondary | ICD-10-CM

## 2011-06-24 MED ORDER — RABEPRAZOLE SODIUM 20 MG PO TBEC: 20.0000 mg | DELAYED_RELEASE_TABLET | Freq: Every day | ORAL | Status: DC

## 2011-06-24 MED ORDER — ALIGN 4 MG PO CAPS: 1.0000 | ORAL_CAPSULE | Freq: Every day | ORAL | Status: DC

## 2011-06-24 NOTE — Progress Notes (Signed)
Subjective:    Patient ID: Dennis Conley, male    DOB: 1961-04-14, 50 y.o.   MRN: [id number]  HPI Dennis Conley is a 50 yo male with PMH of hypertension, hyperlipidemia, depression, and GERD with eosinophilic esophagitis who seen for followup.  The patient underwent upper endoscopy on 05/08/2011 which revealed mild fundic gastritis which on biopsy was H. pylori negative and and normal esophagus, however biopsies revealed changes of eosinophilic esophagitis. At that time he was continued on omeprazole 20 mg daily , and fluticasone swallowed twice a day was added. He returns today for followup. He reports still having heartburn 3 or 4 days per week. He has had some associated hoarseness as well as a sore throat. He describes frequently tasting acid in the back of his throat and he does have water brash.  He reports no odynophagia and his dysphagia is better, though at times he still feels as if some foods have a difficult time reaching his stomach. Foods such as breads tend to be worse for him. He denies abdominal pain, but endorses bloating. Bloating has been a long-standing symptom for him, and this is worse after eating. He is using MiraLAX 17 g daily with good result. He says without using MiraLAX he is constipated, but is able to have daily bowel movements when he is taking this medication. No bright red blood per rectum or melena. No fevers or chills.   Review of Systems Constitutional: Negative for fever, chills, night sweats, activity change, appetite change and unexpected weight change HEENT: See HPI Eyes: Negative for visual disturbance Respiratory: Negative for cough, chest tightness and shortness of breath Cardiovascular: Negative for chest pain, palpitations and lower extremity swelling Gastrointestinal: See history of present illness Genitourinary: Negative for dysuria and hematuria. Musculoskeletal: Chronic back pain, negative for other arthralgias and myalgias Skin: Negative for rash or  color change Neurological: Negative for weakness, numbness, positive for occasional headaches Hematological: Negative for adenopathy, negative for easy bruising/bleeding Psychiatric/behavioral: Negative for depressed mood, negative for anxiety   Past Medical History  Diagnosis Date  . ERECTILE DYSFUNCTION 10/29/2010  . Chronic headaches 10/29/2010  . HYPERLIPIDEMIA 10/29/2010  . OSTEOARTHRITIS 10/29/2010  . DEPRESSION 10/29/2010    Dr Dub Mikes  . IBS (irritable bowel syndrome)   . GERD (gastroesophageal reflux disease)   . Anxiety   . Hypertension   . Eosinophilic esophagitis    Current Outpatient Prescriptions  Medication Sig Dispense Refill  . amitriptyline (ELAVIL) 50 MG tablet Take 50 mg by mouth.        . clonazePAM (KLONOPIN) 2 MG tablet Take 2 mg by mouth 2 (two) times daily.        . DULoxetine (CYMBALTA) 60 MG capsule Take 60 mg by mouth daily.       . Fluticasone Propionate, Inhal, 250 MCG/BLIST AEPB Two puffs into mouth and swallowed twice daily. DO NOT EAT OR DRINK FOR AT LEAST 30 MINUTES AFTER EACH DOSE.  2 each  0  . gabapentin (NEURONTIN) 300 MG capsule 3 tablets 3 times per day       . nebivolol (BYSTOLIC) 10 MG tablet Take 1 tablet (10 mg total) by mouth daily.  154 tablet  0  . omeprazole (PRILOSEC) 40 MG capsule Take 40 mg by mouth daily.        Marland Kitchen oxyCODONE-acetaminophen (PERCOCET) 10-325 MG per tablet Take 1 tablet by mouth every 6 (six) hours as needed for pain.  120 tablet  0  . polyethylene glycol (MIRALAX /  GLYCOLAX) packet Take 17 g by mouth daily.        . rosuvastatin (CRESTOR) 10 MG tablet Take 1 tablet (10 mg total) by mouth at bedtime.  84 tablet  0  . Probiotic Product (ALIGN) 4 MG CAPS Take 1 capsule by mouth daily.  28 capsule  0  . RABEprazole (ACIPHEX) 20 MG tablet Take 1 tablet (20 mg total) by mouth daily.  30 tablet  0   No Known Allergies  Family History  Problem Relation Age of Onset  . Alcohol abuse Father   . Arthritis Mother   . Hypertension  Father   . Hyperlipidemia Father   . Prostate cancer Father   . Stroke Paternal Grandmother   . Colon cancer Maternal Grandfather 93  . Diabetes Mother   . Heart disease Paternal Uncle     x 2  . Colon cancer Cousin 48  . Esophageal cancer Paternal Uncle   . Arthritis Father   . Hypertension Mother   . Stroke Maternal Grandmother   . Diabetes Father   . Heart disease Paternal Grandmother   . Prostate cancer Paternal Uncle     x 4  . Prostate cancer Cousin     x 2    Social History  . Marital Status: Married    Spouse Name: N/A    Number of Children: 5   Occupational History  . Disabled    Social History Main Topics  . Smoking status: Never Smoker   . Smokeless tobacco: Never Used  . Alcohol Use: No  . Drug Use: No  . Sexually Active: Yes   Social History Narrative   3 caffeine drinks daily        Objective:   Physical Exam BP 120/76  Pulse 76  Ht 6\' 2"  (1.88 m)  Wt 236 lb 3.2 oz (107.14 kg)  BMI 30.33 kg/m2 Constitutional: Well-developed and well-nourished. No distress. HEENT: Normocephalic and atraumatic. Oropharynx is clear and moist. No oropharyngeal exudate. Conjunctivae are normal. Pupils are equal round and reactive to light. No scleral icterus. Neck: Neck supple. Trachea midline. Cardiovascular: Normal rate, regular rhythm and intact distal pulses. No M/R/G Pulmonary/chest: Effort normal and breath sounds normal. No wheezing, rales or rhonchi. Abdominal: Soft, nontender, mildly distended, Bowel sounds active throughout. There are no masses palpable. No hepatosplenomegaly. Extremities: no clubbing, cyanosis, or edema Lymphadenopathy: No cervical adenopathy noted. Neurological: Alert and oriented to person place and time. Skin: Skin is warm and dry. No rashes noted. Psychiatric: Normal mood and affect. Behavior is normal.      Assessment & Plan:  50 yo male with PMH of hypertension, hyperlipidemia, depression, and GERD with eosinophilic esophagitis  who seen for followup.  1. EoE/GERD -- the patient's reflux symptoms and dysphagia likely associated with eosinophilic esophagitis are not completely controlled at present. He's only on omeprazole 20 mg daily, and  may benefit from a more potent PPI.  We also discussed lifestyle modification, including carbonated beverages, eating within 2 hours of lying down, and avoiding acidic and fatty foods.  I will switch him to AcipHex 20 mg daily. I've asked that he take this 30 minutes to one hour before his first pill today. I would like for him to complete fluticasone therapy for 8-10 weeks and then stop. We have discussed how patients with eosinophilic esophagitis may need topical steroid therapy intermittently, but usually do not require this continuously.  Hopefully he will be able to stop this medication and have his symptoms completely  controlled on a more potent PPI.  2. IBS/constipation/bloating -- we have discussed the safety of MiraLAX 17 g daily and he will continue this medication. We discussed  foods which make bloating worse including carbonated beverages and cruciferous vegetables.  I will try him on Align one capsule daily as a probiotic. Also will give him a handout on a low bloating diet.   2. CRC screening -- the patient had a screening colonoscopy in 2010. He does have a family history of colorectal cancer, and therefore is considered an elevated risk for screening pI will see him back in 8 weeks to assess his response.urposes.  He is due a repeat colonoscopy in February 2015.

## 2011-06-24 NOTE — Patient Instructions (Addendum)
We have given you samples of Aciphex, take 1 tab 30 min before breakfast.  We also gave you samples of Align a probiotic to take 1 capsule daily.  Stop the Omeprazole ( Prilosec).  Complete the Fluticasone. Continue the Miralax, 17 grams in water or clear juice daily. We have given you a brochure for a  Low gas diet .  Come back in 8 weeks to see Dr. [name] and call us sooner if you have not improved.

## 2011-06-28 ENCOUNTER — Telehealth: Payer: Self-pay

## 2011-06-28 DIAGNOSIS — M549 Dorsalgia, unspecified: Secondary | ICD-10-CM

## 2011-06-28 MED ORDER — OXYCODONE-ACETAMINOPHEN 10-325 MG PO TABS: 1.0000 | ORAL_TABLET | Freq: Four times a day (QID) | ORAL | Status: DC | PRN

## 2011-06-28 NOTE — Telephone Encounter (Signed)
done

## 2011-06-28 NOTE — Telephone Encounter (Signed)
Patient notified and rx placed upfront 

## 2011-06-28 NOTE — Telephone Encounter (Signed)
Patient called requesting rx refill for percocet, last filled 05/29/2011. Please advise Thanks

## 2011-07-01 ENCOUNTER — Encounter: Payer: Self-pay | Attending: Physical Medicine and Rehabilitation | Admitting: Physical Medicine and Rehabilitation

## 2011-07-01 DIAGNOSIS — R209 Unspecified disturbances of skin sensation: Secondary | ICD-10-CM | POA: Insufficient documentation

## 2011-07-01 DIAGNOSIS — F411 Generalized anxiety disorder: Secondary | ICD-10-CM | POA: Insufficient documentation

## 2011-07-01 DIAGNOSIS — I252 Old myocardial infarction: Secondary | ICD-10-CM | POA: Insufficient documentation

## 2011-07-01 DIAGNOSIS — M542 Cervicalgia: Secondary | ICD-10-CM

## 2011-07-01 DIAGNOSIS — M545 Low back pain, unspecified: Secondary | ICD-10-CM | POA: Insufficient documentation

## 2011-07-01 DIAGNOSIS — G8929 Other chronic pain: Secondary | ICD-10-CM | POA: Insufficient documentation

## 2011-07-01 DIAGNOSIS — M79609 Pain in unspecified limb: Secondary | ICD-10-CM

## 2011-07-01 DIAGNOSIS — Z981 Arthrodesis status: Secondary | ICD-10-CM | POA: Insufficient documentation

## 2011-07-01 DIAGNOSIS — F329 Major depressive disorder, single episode, unspecified: Secondary | ICD-10-CM | POA: Insufficient documentation

## 2011-07-01 DIAGNOSIS — F3289 Other specified depressive episodes: Secondary | ICD-10-CM | POA: Insufficient documentation

## 2011-07-01 NOTE — Assessment & Plan Note (Signed)
HISTORY OF PRESENT ILLNESS:  Dennis Conley is a pleasant 50 year old married gentleman who is a patient of primary care physician, Dr. [name]. Dennis Conley was last seen by me on May 31, 2011.  He is back in today for a recheck.  At the last visit, he was switched from [facility] 10/325 four times a day to MS Contin 15 mg 3 times a day.  He had requested a longer acting pain medication.  He is back in today and states that he is getting a little relief with this medicine, it seems to be a bit better than the oxycodone that he has been taking.  He indicates his average pain is about 8 on a scale of 10.  Pain radiates from the left side of his neck through  his trunk through the left lower extremity into the thigh and the foot.  Pain is described as sharp and aching.  It is rather constant throughout the day.  He does use gabapentin as well taking that 600 mg 4 times a day, however, he notes since he has started the morphine, he sometimes has forgotten now to take his afternoon dose of the gabapentin.  FUNCTIONAL STATUS:  He can walk about 1 minute at a time.  He is able to climb stairs.  He can drive.  He is independent with feeding, bathing, and toileting.  He tells me he does help unload the dishwasher, does some sweeping but both these activities do aggravate his back.  He indicates he has some difficulty with dressing at times and meal prep and household duties.  He has been stable since April 09, 2010.  He indicates he has some numbness especially in left lower extremity. Depression and anxiety.  Denies suicidal ideation.  Occasional constipation is noted as well.  PAST MEDICAL, SOCIAL AND FAMILY HISTORY:  Unchanged from previous visit. He will be following up with primary care to help manage his medical problems.  He has had a history of a heart attack in the past.  PHYSICAL EXAMINATION:  VITAL SIGNS:  Today, his blood pressure is 136/83, pulse 91, respirations 16, 95%  saturated on room air. GENERAL:  He is well-developed, well-nourished gentleman who does not appear in any distress.  He is oriented x3.  His speech is clear.  His affect is bright.  He is alert, cooperative, and pleasant.  Follows commands without difficulty.  Answers my questions appropriately. NEURO:  Cranial nerves and coordination are intact.  His reflexes are 2+ in the upper extremities, 2+ in lower extremities.  No abnormal tone, clonus or tremors are noted.  Hoffmann sign is negative.  He has good strength in both upper and lower extremities without obvious focal deficits. He reports decreased sensation in left lower extremity.  Transitioning from sitting to standing is done with ease.  His gait is nonantalgic.  Tandem gait is performed adequately.  Romberg test is performed adequately.  He has limitations in cervical range of motion in all planes.  He has limited lumbar motion.  He has relatively well- preserved shoulder motion.  Internal and external rotation at the hips does not aggravate groin or posterior hip pain or low back pain.  He has no tenderness along the trochanters either today.  IMPRESSION: 1. Status post L4-S1 fusion with 6-mm anterolisthesis at L5-S1 which     has been fused in a position of anterolisthesis.  Initial surgery     was done in [place], [place] in 2005 without apparent redo in  2006, exact procedure is unknown. 2. Status post 3-level cervical fusion C4-5, C5-6, C6-7 for     degenerative disk disease, underwent anterior cervical     decompression and fusion by Dr. Yevette Edwards, February 2012. 3. Chronic neck and low back and left leg pain. 4. Possible involvement of bilateral L4 nerve roots per last lumbar     myelogram November 09, 2010, suggested some effacement at the L3-4     level, slightly worse on the left. 5. History of depression and anxiety.  PLAN:  We discussed the treatment options again today regarding his narcotic pain medications.   Mr. Shelp understands that these may not give him full relief of all of his pain.  He finds that they do help somewhat.  He states that he does better functionally when he is on some narcotic versus not.  He also finds the gabapentin to be helpful as well in managing his leg pain.  Once again today I reviewed some of the risks and benefits of using narcotic pain medications.  I went over dependency, tolerance as well as withdrawal symptoms with him and answered questions regarding this.  He had a form that he brought in from his insurance looks like he may need to do an FCE, however, I have asked him to follow up with his orthopedic surgeon regarding whether or not he could participate in an FCE given the surgery he has had.  He still has a dorsal column stimulator which apparently is not working as well.  I refilled his MS Contin today.  I have asked him to continue to use his gabapentin.  I have answered all his questions.  He will follow up with primary care regarding any other medical problems he has had.  We discussed starting to do some walking again and he will discuss this with his primary care to see whether he is healthy enough to engage in his activity given his previous cardiac status.  We will have him follow up with nurse practitioner in December for pill count and refill of his pain medication.  I will see him back in January.     Brantley Stage, M.D. Electronically Signed    DMK/MedQ D:  07/01/2011 10:36:00  T:  07/01/2011 11:24:04  Job #:  [id number]

## 2011-07-25 ENCOUNTER — Other Ambulatory Visit: Payer: Self-pay | Admitting: *Deleted

## 2011-07-25 DIAGNOSIS — G8929 Other chronic pain: Secondary | ICD-10-CM

## 2011-07-25 NOTE — Telephone Encounter (Signed)
Request for Percocet 10-325 mg [last refill 11.16.12 #[id number]].  LMOM to inform patient that request forwarded to MD but Rx will not be fillable until on or after 12.16.12 after receiving approval to print.

## 2011-07-26 MED ORDER — OXYCODONE-ACETAMINOPHEN 10-325 MG PO TABS: 1.0000 | ORAL_TABLET | Freq: Four times a day (QID) | ORAL | Status: DC | PRN

## 2011-07-26 NOTE — Telephone Encounter (Signed)
done

## 2011-07-26 NOTE — Telephone Encounter (Signed)
Patient notified

## 2011-07-29 ENCOUNTER — Encounter: Payer: Self-pay | Admitting: Neurosurgery

## 2011-07-30 ENCOUNTER — Encounter: Payer: Self-pay | Admitting: Neurosurgery

## 2011-07-31 ENCOUNTER — Encounter: Payer: Self-pay | Admitting: Internal Medicine

## 2011-07-31 ENCOUNTER — Ambulatory Visit (INDEPENDENT_AMBULATORY_CARE_PROVIDER_SITE_OTHER): Payer: Self-pay | Admitting: Internal Medicine

## 2011-07-31 ENCOUNTER — Other Ambulatory Visit (INDEPENDENT_AMBULATORY_CARE_PROVIDER_SITE_OTHER): Payer: Self-pay

## 2011-07-31 ENCOUNTER — Other Ambulatory Visit: Payer: Self-pay | Admitting: Internal Medicine

## 2011-07-31 VITALS — BP 128/88 | HR 84 | Temp 98.2°F | Resp 16 | Wt 221.0 lb

## 2011-07-31 DIAGNOSIS — IMO0001 Reserved for inherently not codable concepts without codable children: Secondary | ICD-10-CM

## 2011-07-31 DIAGNOSIS — Z23 Encounter for immunization: Secondary | ICD-10-CM

## 2011-07-31 DIAGNOSIS — R739 Hyperglycemia, unspecified: Secondary | ICD-10-CM

## 2011-07-31 DIAGNOSIS — R7309 Other abnormal glucose: Secondary | ICD-10-CM

## 2011-07-31 DIAGNOSIS — E7800 Pure hypercholesterolemia, unspecified: Secondary | ICD-10-CM

## 2011-07-31 DIAGNOSIS — I10 Essential (primary) hypertension: Secondary | ICD-10-CM

## 2011-07-31 LAB — CBC WITH DIFFERENTIAL/PLATELET
Basophils Absolute: 0.1 K/uL (ref 0.0–0.1)
Basophils Relative: 1.5 % (ref 0.0–3.0)
Eosinophils Absolute: 0.1 K/uL (ref 0.0–0.7)
Eosinophils Relative: 2.6 % (ref 0.0–5.0)
HCT: 46.4 % (ref 39.0–52.0)
Hemoglobin: 15.8 g/dL (ref 13.0–17.0)
Lymphocytes Relative: 29.1 % (ref 12.0–46.0)
Lymphs Abs: 1.6 K/uL (ref 0.7–4.0)
MCHC: 34 g/dL (ref 30.0–36.0)
MCV: 84.1 fl (ref 78.0–100.0)
Monocytes Absolute: 0.3 K/uL (ref 0.1–1.0)
Monocytes Relative: 5.2 % (ref 3.0–12.0)
Neutro Abs: 3.5 K/uL (ref 1.4–7.7)
Neutrophils Relative %: 61.6 % (ref 43.0–77.0)
Platelets: 173 K/uL (ref 150.0–400.0)
RBC: 5.53 Mil/uL (ref 4.22–5.81)
RDW: 13.4 % (ref 11.5–14.6)
WBC: 5.6 K/uL (ref 4.5–10.5)

## 2011-07-31 LAB — URINALYSIS, ROUTINE W REFLEX MICROSCOPIC
Bilirubin Urine: NEGATIVE
Ketones, ur: NEGATIVE
Leukocytes, UA: NEGATIVE
Nitrite: NEGATIVE
Specific Gravity, Urine: 1.015
Total Protein, Urine: 30
Urine Glucose: >=1000
Urobilinogen, UA: 0.2
pH: 6 (ref 5.0–8.0)

## 2011-07-31 LAB — COMPREHENSIVE METABOLIC PANEL WITH GFR
ALT: 85 U/L — ABNORMAL HIGH (ref 0–53)
AST: 59 U/L — ABNORMAL HIGH (ref 0–37)
Albumin: 5.4 g/dL — ABNORMAL HIGH (ref 3.5–5.2)
Alkaline Phosphatase: 142 U/L — ABNORMAL HIGH (ref 39–117)
BUN: 7 mg/dL (ref 6–23)
CO2: 29 meq/L (ref 19–32)
Calcium: 9.7 mg/dL (ref 8.4–10.5)
Chloride: 93 meq/L — ABNORMAL LOW (ref 96–112)
Creatinine, Ser: 1.1 mg/dL (ref 0.4–1.5)
GFR: 88.8 mL/min
Glucose, Bld: 416 mg/dL — ABNORMAL HIGH (ref 70–99)
Potassium: 3.6 meq/L (ref 3.5–5.1)
Sodium: 134 meq/L — ABNORMAL LOW (ref 135–145)
Total Bilirubin: 0.7 mg/dL (ref 0.3–1.2)
Total Protein: 9.1 g/dL — ABNORMAL HIGH (ref 6.0–8.3)

## 2011-07-31 LAB — LIPID PANEL
HDL: 41.1 mg/dL (ref >39.00)
Total CHOL/HDL Ratio: 5
VLDL: 72.8 mg/dL — ABNORMAL HIGH (ref 0.0–40.0)

## 2011-07-31 LAB — MICROALBUMIN / CREATININE URINE RATIO
Creatinine,U: 118.7 mg/dL
Microalb Creat Ratio: 15.7 mg/g (ref 0.0–30.0)
Microalb, Ur: 18.6 mg/dL — ABNORMAL HIGH (ref 0.0–1.9)

## 2011-07-31 LAB — TSH: TSH: 1.1 u[IU]/mL (ref 0.35–5.50)

## 2011-07-31 LAB — GLUCOSE, POCT (MANUAL RESULT ENTRY): POC Glucose: 394

## 2011-07-31 LAB — HEMOGLOBIN A1C: Hgb A1c MFr Bld: 12.4 % — ABNORMAL HIGH (ref 4.6–6.5)

## 2011-07-31 MED ORDER — SITAGLIPTIN PHOS-METFORMIN HCL 50-500 MG PO TABS: 1.0000 | ORAL_TABLET | Freq: Two times a day (BID) | ORAL | Status: DC

## 2011-07-31 NOTE — Assessment & Plan Note (Signed)
He is doing well on crestor, I will check his FLP today

## 2011-07-31 NOTE — Assessment & Plan Note (Signed)
I will check his a1c  And c-peptide level and will start janumet, is he has a high a/c ration then I will ask him to start an ARB

## 2011-07-31 NOTE — Progress Notes (Signed)
Subjective:    Patient ID: Dennis Conley, male    DOB: 15-Nov-1960, 50 y.o.   MRN: [id number]  Diabetes He presents for his initial diabetic visit. He has type 2 diabetes mellitus. His disease course has been worsening. There are no hypoglycemic associated symptoms. Pertinent negatives for hypoglycemia include no dizziness, headaches, pallor, seizures, speech difficulty or tremors. Associated symptoms include polydipsia, polyphagia and polyuria. Pertinent negatives for diabetes include no blurred vision, no chest pain, no fatigue, no foot paresthesias, no foot ulcerations, no visual change, no weakness and no weight loss. There are no hypoglycemic complications. Symptoms are worsening. Symptoms have been present for 2 weeks. There are no diabetic complications. When asked about current treatments, none were reported. His weight is increasing steadily. He is following a generally unhealthy diet. Meal planning includes avoidance of concentrated sweets. He has not had a previous visit with a dietician. He never participates in exercise. There is no change in his home blood glucose trend. An ACE inhibitor/angiotensin II receptor blocker is not being taken. He does not see a podiatrist.Eye exam is not current.      Review of Systems  Constitutional: Negative for fever, chills, weight loss, diaphoresis, activity change, appetite change, fatigue and unexpected weight change.  HENT: Negative.   Eyes: Negative.  Negative for blurred vision.  Respiratory: Negative for cough, chest tightness, shortness of breath, wheezing and stridor.   Cardiovascular: Negative for chest pain, palpitations and leg swelling.  Gastrointestinal: Negative for nausea, vomiting, abdominal pain, diarrhea, constipation and abdominal distention.  Genitourinary: Positive for polyuria and frequency. Negative for dysuria, urgency, hematuria, flank pain, decreased urine volume, enuresis and difficulty urinating.  Musculoskeletal: Negative  for myalgias, back pain, joint swelling, arthralgias and gait problem.  Skin: Negative for color change, pallor and rash.  Neurological: Negative for dizziness, tremors, seizures, syncope, facial asymmetry, speech difficulty, weakness, light-headedness, numbness and headaches.  Hematological: Positive for polydipsia and polyphagia. Negative for adenopathy. Does not bruise/bleed easily.  Psychiatric/Behavioral: Negative.        Objective:   Physical Exam  Vitals reviewed. Constitutional: He is oriented to person, place, and time. He appears well-developed and well-nourished.  HENT:  Head: Normocephalic and atraumatic.  Mouth/Throat: No oropharyngeal exudate.  Eyes: Conjunctivae are normal. Right eye exhibits no discharge. Left eye exhibits no discharge. No scleral icterus.  Neck: Normal range of motion. Neck supple. No JVD present. No tracheal deviation present. No thyromegaly present.  Cardiovascular: Normal rate, regular rhythm, normal heart sounds and intact distal pulses.  Exam reveals no gallop and no friction rub.   No murmur heard. Pulmonary/Chest: Effort normal and breath sounds normal. No stridor. No respiratory distress. He has no wheezes. He has no rales. He exhibits no tenderness.  Abdominal: Soft. Bowel sounds are normal. He exhibits no distension. There is no tenderness. There is no rebound and no guarding.  Musculoskeletal: Normal range of motion. He exhibits no edema and no tenderness.  Lymphadenopathy:    He has no cervical adenopathy.  Neurological: He is oriented to person, place, and time.  Skin: Skin is warm and dry. No rash noted. He is not diaphoretic. No erythema. No pallor.  Psychiatric: He has a normal mood and affect. His behavior is normal. Judgment and thought content normal.     Lab Results  Component Value Date   WBC 5.6 01/23/2011   HGB 15.0 01/23/2011   HCT 43.5 01/23/2011   PLT 192.0 01/23/2011   GLUCOSE 110* 01/23/2011   CHOL 309*  01/23/2011   TRIG  135.0 01/23/2011   HDL 48.90 01/23/2011   LDLDIRECT 240.6 01/23/2011   ALT 33 01/23/2011   AST 20 01/23/2011   NA 142 01/23/2011   K 4.0 01/23/2011   CL 107 01/23/2011   CREATININE 1.1 01/23/2011   BUN 8 01/23/2011   CO2 26 01/23/2011   TSH 0.93 01/23/2011   INR 1.06 09/12/2010       Assessment & Plan:

## 2011-07-31 NOTE — Patient Instructions (Signed)

## 2011-08-01 ENCOUNTER — Encounter: Payer: Self-pay | Admitting: Internal Medicine

## 2011-08-01 LAB — C-PEPTIDE: C-Peptide: 2.16 ng/mL (ref 0.80–3.90)

## 2011-08-02 ENCOUNTER — Ambulatory Visit: Payer: Self-pay | Admitting: Internal Medicine

## 2011-08-07 ENCOUNTER — Encounter: Payer: Self-pay | Attending: Neurosurgery | Admitting: Neurosurgery

## 2011-08-09 ENCOUNTER — Encounter: Payer: Self-pay | Admitting: Internal Medicine

## 2011-08-09 ENCOUNTER — Ambulatory Visit (INDEPENDENT_AMBULATORY_CARE_PROVIDER_SITE_OTHER): Payer: Self-pay | Admitting: Internal Medicine

## 2011-08-09 DIAGNOSIS — K219 Gastro-esophageal reflux disease without esophagitis: Secondary | ICD-10-CM

## 2011-08-09 DIAGNOSIS — K589 Irritable bowel syndrome without diarrhea: Secondary | ICD-10-CM

## 2011-08-09 DIAGNOSIS — K200 Eosinophilic esophagitis: Secondary | ICD-10-CM

## 2011-08-09 LAB — ANTI-ISLET CELL ANTIBODY: Pancreatic Islet Cell Antibody: <5 JDF Units (ref <5)

## 2011-08-09 MED ORDER — RABEPRAZOLE SODIUM 20 MG PO TBEC: 20.0000 mg | DELAYED_RELEASE_TABLET | Freq: Every day | ORAL | Status: DC

## 2011-08-09 MED ORDER — DICYCLOMINE HCL 10 MG PO CAPS: 10.0000 mg | ORAL_CAPSULE | ORAL | Status: DC | PRN

## 2011-08-09 NOTE — Patient Instructions (Signed)
Your refill for Aciphex has been sent to your pharmacy along with a new prescription for dicyclomine.  Align samples given to take for several weeks. cc: [name], MD

## 2011-08-09 NOTE — Progress Notes (Signed)
Subjective:    Patient ID: Dennis Conley, male    DOB: [birth year], 50 y.o.   MRN: [id number]  HPI 50 yo male with PMH of hypertension, hyperlipidemia, depression, and GERD with eosinophilic esophagitis who seen for followup.  Mr. Wedig missed an appointment last week but presents today without appointment to be seen.  He is alone today. Since last being seen in my office he has been officially diagnosed with diabetes. He was started on Janumet twice daily.  Today he reports return of significant reflux and heartburn. He is having some issues with swallowing, but not as bad as when he was diagnosed with eosinophilic esophagitis. He reports having run out of his AcipHex, but noted this to work and help significantly. He notes some mild nausea and bloating but no vomiting. Yesterday he reported epigastric pain which seemed to radiate into his lower abdomen. This lasted for about 2 hours and resolved. It has not recurred. His bowel movements remain normal for him on MiraLAX. He is using MiraLAX 17 g daily. He knows if he misses a day he will suffer constipation. No melena or bright red blood per rectum. No fevers or chills. He also reports improvement in his gas and bloating while taking Align. He is also out of this medication at present.  Review of Systems Constitutional: Negative for fever, chills, night sweats, activity change, positive for weight increase HEENT: Negative for sore throat, mouth sores and trouble swallowing. Eyes: Negative for visual disturbance Respiratory: Negative for cough, chest tightness and shortness of breath Cardiovascular: Negative for chest pain, palpitations and lower extremity swelling Gastrointestinal: See history of present illness Genitourinary: Negative for dysuria and hematuria, positive for polyuria Musculoskeletal: Negative for back pain, arthralgias and myalgias Skin: Negative for rash or color change Neurological: Negative for headaches, weakness,  numbness Hematological: Negative for adenopathy, negative for easy bruising/bleeding Psychiatric/behavioral: Negative for depressed mood, negative for anxiety  Past Medical History  Diagnosis Date  . ERECTILE DYSFUNCTION 10/29/2010  . Chronic headaches 10/29/2010  . HYPERLIPIDEMIA 10/29/2010  . OSTEOARTHRITIS 10/29/2010  . DEPRESSION 10/29/2010    Dr Dub Mikes  . IBS (irritable bowel syndrome)   . GERD (gastroesophageal reflux disease)   . Anxiety   . Hypertension   . Eosinophilic esophagitis   . Diabetes mellitus   . Hypertension    Past Surgical History  Procedure Date  . Cervical fusion   . Cervical laminectomy 2012    Dr. Yevette Edwards  . Lumbar laminectomy 2007  . Lumbar fusion 2006  . Nasal sinus surgery   . Spinal cord stimulator implant 2011   Current Outpatient Prescriptions  Medication Sig Dispense Refill  . amitriptyline (ELAVIL) 50 MG tablet Take 50 mg by mouth.        . clonazePAM (KLONOPIN) 2 MG tablet Take 2 mg by mouth 2 (two) times daily.        . DULoxetine (CYMBALTA) 60 MG capsule Take 60 mg by mouth daily.       Marland Kitchen gabapentin (NEURONTIN) 300 MG capsule 3 tablets 3 times per day       . nebivolol (BYSTOLIC) 10 MG tablet Take 1 tablet (10 mg total) by mouth daily.  154 tablet  0  . oxyCODONE-acetaminophen (PERCOCET) 10-325 MG per tablet Take 1 tablet by mouth every 6 (six) hours as needed for pain.  120 tablet  0  . polyethylene glycol (MIRALAX / GLYCOLAX) packet Take 17 g by mouth daily.        . Probiotic  Product (ALIGN) 4 MG CAPS Take 1 capsule by mouth daily.  28 capsule  0  . rosuvastatin (CRESTOR) 10 MG tablet Take 1 tablet (10 mg total) by mouth at bedtime.  84 tablet  0  . sitaGLIPtan-metformin (JANUMET) 50-500 MG per tablet Take 1 tablet by mouth 2 (two) times daily with a meal.  112 tablet  0  . dicyclomine (BENTYL) 10 MG capsule Take 1 capsule (10 mg total) by mouth as needed.  60 capsule  2  . RABEprazole (ACIPHEX) 20 MG tablet Take 1 tablet (20 mg total) by  mouth daily.  30 tablet  11   No Known Allergies  Family History  Problem Relation Age of Onset  . Alcohol abuse Father   . Arthritis Mother   . Hypertension Father   . Hyperlipidemia Father   . Prostate cancer Father   . Stroke Paternal Grandmother   . Colon cancer Maternal Grandfather 1  . Diabetes Mother   . Heart disease Paternal Uncle     x 2  . Colon cancer Cousin 48  . Esophageal cancer Paternal Uncle   . Arthritis Father   . Hypertension Mother   . Stroke Maternal Grandmother   . Diabetes Father   . Heart disease Paternal Grandmother   . Prostate cancer Paternal Uncle     x 4  . Prostate cancer Cousin     x 2   SH- reviewed and no change    Objective:   Physical Exam BP 124/76  Pulse 88  Ht 6\' 2"  (1.88 m)  Wt 226 lb (102.513 kg)  BMI 29.02 kg/m2 Constitutional: Well-developed and well-nourished. No distress. HEENT: Normocephalic and atraumatic. Oropharynx is clear and moist. No oropharyngeal exudate. Conjunctivae are normal. Pupils are equal round and reactive to light. No scleral icterus. Cardiovascular: Normal rate, regular rhythm and intact distal pulses. No M/R/G Pulmonary/chest: Effort normal and breath sounds normal. No wheezing, rales or rhonchi. Abdominal: Soft, nontender, mildly distended. Bowel sounds active throughout. There are no masses palpable. No hepatosplenomegaly. Extremities: no clubbing, cyanosis, or edema Neurological: Alert and oriented to person place and time. Skin: Skin is warm and dry. No rashes noted. Psychiatric: Normal mood and affect. Behavior is normal.     Assessment & Plan:  50 yo male with PMH of hypertension, hyperlipidemia, depression, and GERD with eosinophilic esophagitis who seen for followup, now with return of heartburn and GERD symptoms off PPI  1. GERD/EoE -- the patient has a known history of heartburn and this was responding very well to AcipHex daily. He is out of this medication and thus a prescription will be  written today and he is given samples. I've advised that he take this daily, 30 minutes to one hour before breakfast. I also would like him to restart Align is at the this helps him with digestion as well as some of his gas and bloating, which for him are likely IBS symptoms.  It seems that his eosinophilic esophagitis is well-controlled for now, and hopefully PPI alone we'll continue to keep this disease in remission. Again, he may intermittently need swallowed fluticasone therapy.  I do think some of his abdominal symptoms are related to gastroparesis in the setting of hyperglycemia and recently diagnosed diabetes. Hopefully as his blood sugars improved this condition will improve. I will give him a prescription for dicyclomine to be used 3 times a day when necessary as needed for abdominal pain/spasm. I will see him back in about 3 months or sooner  if needed.  2. CRC screening -- patient is high risk based on family history, due repeat colonoscopy 2015

## 2011-08-15 ENCOUNTER — Encounter: Payer: Self-pay | Admitting: Neurosurgery

## 2011-08-19 ENCOUNTER — Encounter: Payer: Self-pay | Attending: Neurosurgery | Admitting: Neurosurgery

## 2011-08-19 DIAGNOSIS — G8929 Other chronic pain: Secondary | ICD-10-CM | POA: Insufficient documentation

## 2011-08-19 DIAGNOSIS — Q762 Congenital spondylolisthesis: Secondary | ICD-10-CM | POA: Insufficient documentation

## 2011-08-19 DIAGNOSIS — M545 Low back pain, unspecified: Secondary | ICD-10-CM | POA: Insufficient documentation

## 2011-08-19 DIAGNOSIS — M961 Postlaminectomy syndrome, not elsewhere classified: Secondary | ICD-10-CM

## 2011-08-19 DIAGNOSIS — M542 Cervicalgia: Secondary | ICD-10-CM | POA: Insufficient documentation

## 2011-08-19 DIAGNOSIS — Z981 Arthrodesis status: Secondary | ICD-10-CM | POA: Insufficient documentation

## 2011-08-19 DIAGNOSIS — G8928 Other chronic postprocedural pain: Secondary | ICD-10-CM

## 2011-08-19 DIAGNOSIS — F341 Dysthymic disorder: Secondary | ICD-10-CM

## 2011-08-20 NOTE — Assessment & Plan Note (Signed)
This is a patient of Dr. Pamelia Hoit is seen for history of low back pain after several fusions in the lumbar spine as well as a cervical fusion and history of chronic neck and back pain.  He reports his pain at a 10. It is a sharp, burning, aching type pain.  General activity level is 7- 10.  The pain is same 24 hours a day.  Sleep patterns are fair.  He does not indicate what worsens or improves the pain.  Mobility, he walks without assistance.  He can climb steps and drive.  He is on disability.  REVIEW OF SYSTEMS:  Notable for difficulties described above as well as some blood sugar fluctuations, numbness, spasm, depression, anxiety.  No suicidal thoughts.  Pill counts, today he did not bring his bottles but he was in the hospital last month with diabetes problems and then was supposed to come last week and was rescheduled today.  PAST MEDICAL HISTORY/SOCIAL HISTORY/FAMILY HISTORY:  Unchanged.  PHYSICAL EXAMINATION:  His blood pressure is 126/68, pulse 79, respirations 16, O2 sats 96 on room air.  His motor strength and sensation are intact.  Constitutionally he is within normal limits.  He is alert and oriented x3.  His gait is normal.  IMPRESSION: 1. History of lumbar fusion with anterolisthesis. 2. History of cervical fusion. 3. Chronic neck and low back pain.  PLAN:  Refill MS Contin 15 mg 1 p.o. q.8 hours, #90 with no refill.  His questions were encouraged and answered.  He will see Dr. Pamelia Hoit in a month.     Farrel Guimond L. Blima Dessert Electronically Signed    RLW/MedQ D:  08/19/2011 15:30:11  T:  08/20/2011 05:58:00  Job #:  [id number]

## 2011-08-26 ENCOUNTER — Other Ambulatory Visit: Payer: Self-pay

## 2011-08-26 DIAGNOSIS — G8929 Other chronic pain: Secondary | ICD-10-CM

## 2011-08-26 NOTE — Telephone Encounter (Signed)
Pt called requesting refill of pain medication

## 2011-08-27 NOTE — Telephone Encounter (Signed)
Please advise if ok to fill. Thanks

## 2011-08-28 ENCOUNTER — Other Ambulatory Visit (HOSPITAL_COMMUNITY): Payer: Self-pay

## 2011-08-30 ENCOUNTER — Ambulatory Visit: Payer: Self-pay | Admitting: Internal Medicine

## 2011-09-02 ENCOUNTER — Ambulatory Visit: Payer: Self-pay | Admitting: Internal Medicine

## 2011-09-02 ENCOUNTER — Ambulatory Visit (INDEPENDENT_AMBULATORY_CARE_PROVIDER_SITE_OTHER): Payer: Self-pay | Admitting: Internal Medicine

## 2011-09-02 ENCOUNTER — Encounter: Payer: Self-pay | Admitting: Internal Medicine

## 2011-09-02 DIAGNOSIS — G8929 Other chronic pain: Secondary | ICD-10-CM

## 2011-09-02 DIAGNOSIS — M549 Dorsalgia, unspecified: Secondary | ICD-10-CM

## 2011-09-02 DIAGNOSIS — E7800 Pure hypercholesterolemia, unspecified: Secondary | ICD-10-CM

## 2011-09-02 DIAGNOSIS — I10 Essential (primary) hypertension: Secondary | ICD-10-CM

## 2011-09-02 MED ORDER — OMEGA-3-ACID ETHYL ESTERS 1 G PO CAPS: 2.0000 g | ORAL_CAPSULE | Freq: Two times a day (BID) | ORAL | Status: DC

## 2011-09-02 MED ORDER — OXYCODONE-ACETAMINOPHEN 10-325 MG PO TABS: 1.0000 | ORAL_TABLET | Freq: Four times a day (QID) | ORAL | Status: DC | PRN

## 2011-09-02 MED ORDER — SITAGLIPTIN PHOS-METFORMIN HCL 50-1000 MG PO TABS: 1.0000 | ORAL_TABLET | Freq: Two times a day (BID) | ORAL | Status: DC

## 2011-09-02 MED ORDER — INSULIN GLARGINE 100 UNIT/ML ~~LOC~~ SOLN: 30.0000 [IU] | Freq: Every day | SUBCUTANEOUS | Status: DC

## 2011-09-02 NOTE — Progress Notes (Signed)
Subjective:    Patient ID: Dennis Conley, male    DOB: October 29, 1960, 51 y.o.   MRN: [id number]  Diabetes He presents for his follow-up diabetic visit. He has type 2 diabetes mellitus. His disease course has been worsening. There are no hypoglycemic associated symptoms. Pertinent negatives for hypoglycemia include no dizziness, headaches, pallor, seizures, speech difficulty or tremors. Associated symptoms include blurred vision, polydipsia, polyphagia and polyuria. Pertinent negatives for diabetes include no chest pain, no fatigue, no foot paresthesias, no foot ulcerations, no visual change, no weakness and no weight loss. There are no hypoglycemic complications. Symptoms are stable. There are no diabetic complications. Current diabetic treatment includes oral agent (dual therapy). He is compliant with treatment all of the time. His weight is stable. He is following a generally healthy diet. Meal planning includes avoidance of concentrated sweets. He has not had a previous visit with a dietician. He never participates in exercise. There is no change in his home blood glucose trend. His breakfast blood glucose range is generally 140-180 mg/dl. His lunch blood glucose range is generally 180-200 mg/dl. His dinner blood glucose range is generally 180-200 mg/dl. His highest blood glucose is >200 mg/dl. His overall blood glucose range is 180-200 mg/dl. An ACE inhibitor/angiotensin II receptor blocker is not being taken. He does not see a podiatrist.Eye exam is current.      Review of Systems  Constitutional: Negative for fever, chills, weight loss, diaphoresis, activity change, appetite change, fatigue and unexpected weight change.  HENT: Negative.   Eyes: Positive for blurred vision.  Respiratory: Negative for cough, chest tightness, shortness of breath, wheezing and stridor.   Cardiovascular: Negative for chest pain, palpitations and leg swelling.  Gastrointestinal: Negative for nausea, vomiting, abdominal  pain, diarrhea, constipation and anal bleeding.  Genitourinary: Positive for polyuria. Negative for dysuria, urgency, frequency, hematuria, flank pain, decreased urine volume, enuresis, difficulty urinating and genital sores.  Musculoskeletal: Positive for back pain (chronic, unchanged). Negative for myalgias, joint swelling, arthralgias and gait problem.  Skin: Negative for color change, pallor, rash and wound.  Neurological: Negative for dizziness, tremors, seizures, syncope, facial asymmetry, speech difficulty, weakness, light-headedness, numbness and headaches.  Hematological: Positive for polydipsia and polyphagia. Negative for adenopathy. Does not bruise/bleed easily.  Psychiatric/Behavioral: Negative.        Objective:   Physical Exam  Vitals reviewed. Constitutional: He is oriented to person, place, and time. He appears well-developed and well-nourished. No distress.  HENT:  Head: Normocephalic and atraumatic.  Mouth/Throat: Oropharynx is clear and moist. No oropharyngeal exudate.  Eyes: Conjunctivae are normal. Right eye exhibits no discharge. Left eye exhibits no discharge. No scleral icterus.  Neck: Normal range of motion. Neck supple. No JVD present. No tracheal deviation present. No thyromegaly present.  Cardiovascular: Normal rate, regular rhythm, normal heart sounds and intact distal pulses.  Exam reveals no gallop and no friction rub.   No murmur heard. Pulmonary/Chest: Effort normal and breath sounds normal. No stridor. No respiratory distress. He has no wheezes. He has no rales. He exhibits no tenderness.  Abdominal: Soft. Bowel sounds are normal. He exhibits no distension and no mass. There is no tenderness. There is no rebound and no guarding.  Musculoskeletal: Normal range of motion. He exhibits no edema and no tenderness.  Lymphadenopathy:    He has no cervical adenopathy.  Neurological: He is oriented to person, place, and time.  Skin: Skin is warm and dry. No rash  noted. He is not diaphoretic. No erythema. No pallor.  Psychiatric: He has a normal mood and affect. His behavior is normal. Judgment and thought content normal.      Lab Results  Component Value Date   WBC 5.6 07/31/2011   HGB 15.8 07/31/2011   HCT 46.4 07/31/2011   PLT 173.0 07/31/2011   GLUCOSE 416* 07/31/2011   CHOL 225* 07/31/2011   TRIG 364.0* 07/31/2011   HDL 41.10 07/31/2011   LDLDIRECT 131.9 07/31/2011   ALT 85* 07/31/2011   AST 59* 07/31/2011   NA 134* 07/31/2011   K 3.6 07/31/2011   CL 93* 07/31/2011   CREATININE 1.1 07/31/2011   BUN 7 07/31/2011   CO2 29 07/31/2011   TSH 1.10 07/31/2011   INR 1.06 09/12/2010   HGBA1C 12.4* 07/31/2011   MICROALBUR 18.6* 07/31/2011      Assessment & Plan:

## 2011-09-02 NOTE — Assessment & Plan Note (Signed)
His BP is well controlled 

## 2011-09-02 NOTE — Assessment & Plan Note (Signed)
He is doing well on crestor 

## 2011-09-02 NOTE — Assessment & Plan Note (Signed)
Continue current meds 

## 2011-09-02 NOTE — Patient Instructions (Signed)

## 2011-09-02 NOTE — Assessment & Plan Note (Signed)
His blood sugar remains too high so I have increased the dose on his janumet and have started him on qd insulin

## 2011-09-04 ENCOUNTER — Ambulatory Visit: Payer: Self-pay | Admitting: Internal Medicine

## 2011-09-04 DIAGNOSIS — Z0279 Encounter for issue of other medical certificate: Secondary | ICD-10-CM

## 2011-09-09 ENCOUNTER — Telehealth: Payer: Self-pay | Admitting: Internal Medicine

## 2011-09-09 NOTE — Telephone Encounter (Signed)
Patient called and left a vm on my phone, stating his insurance company is requesting a note from [employer] stating that he agrees he need to be on disability and that he also has diabetes. Please advise.

## 2011-09-10 ENCOUNTER — Encounter: Payer: Self-pay | Admitting: Internal Medicine

## 2011-09-10 NOTE — Telephone Encounter (Signed)
done

## 2011-09-11 ENCOUNTER — Telehealth: Payer: Self-pay | Admitting: Internal Medicine

## 2011-09-11 NOTE — Telephone Encounter (Signed)
Patient left a message on my vm stating that his insurance company is willing to put him through a rehab training class to allow him to do some sedentary work with a letter stating that the md approves. The letter is to be faxed to [facility] at [phone] with claim # [id number].

## 2011-09-15 NOTE — Telephone Encounter (Signed)
done

## 2011-09-16 ENCOUNTER — Ambulatory Visit (HOSPITAL_COMMUNITY): Payer: Self-pay | Admitting: Psychiatry

## 2011-09-17 NOTE — Telephone Encounter (Signed)
done

## 2011-09-18 ENCOUNTER — Ambulatory Visit: Payer: Self-pay | Admitting: Physical Medicine and Rehabilitation

## 2011-09-25 ENCOUNTER — Encounter (HOSPITAL_COMMUNITY): Payer: Self-pay | Admitting: Psychiatry

## 2011-09-25 ENCOUNTER — Ambulatory Visit (INDEPENDENT_AMBULATORY_CARE_PROVIDER_SITE_OTHER): Payer: Self-pay | Admitting: Psychiatry

## 2011-09-25 VITALS — BP 153/100 | HR 77 | Ht 74.0 in | Wt 223.8 lb

## 2011-09-25 DIAGNOSIS — F329 Major depressive disorder, single episode, unspecified: Secondary | ICD-10-CM

## 2011-09-25 MED ORDER — AMITRIPTYLINE HCL 75 MG PO TABS: 75.0000 mg | ORAL_TABLET | Freq: Every day | ORAL | Status: DC

## 2011-09-25 MED ORDER — CLONAZEPAM 1 MG PO TABS: 1.0000 mg | ORAL_TABLET | Freq: Two times a day (BID) | ORAL | Status: DC | PRN

## 2011-09-25 NOTE — Progress Notes (Signed)
Chief complaint I still have difficulty sleeping  History of presenting illness Patient is 51 year old [place] American male who came for his followup appointment. Patient has been seeing in this office since June 2012. He was referred from intensive outpatient program. He has a history of significant depression with feelings of hopelessness and helplessness. He's been taking the antidepressant from different providers. Currently he is on Cymbalta 60 mg and amitriptyline 50 mg. Patient continued to endorse poor sleep and anxiety symptoms. He is not seeing therapist due to noncompliance with his appointment. He is recently diagnosed with diabetes and very worried about his physical health. Though he denies any agitation anger or mood swings but endorse poor sleep racing thoughts at times hopeless feeling. He denies any active or passive suicidal thoughts. He reported he's cannot sleep with 50 mg of amitriptyline. He also takes Klonopin 1 mg twice a day. He never abuses his Klonopin or ask for early refill. He denies any drinking or using drugs. 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 He is recently started the process and hoping to have a good news. At this time he is tolerating the medication without any side effects. He denies any tremors or shakes.  Past psychiatric history Patient has at least 2 intensive outpatient program in the past due to depression and anxiety. He admitted taking Klonopin and Xanax at a very early age prescribed by primary care Dr. in [place]. He denies any history of suicidal attempt but endorse anxiety and depression. He denies any inpatient psychiatric treatment. He denies any paranoia or psychosis. He denies any history of violence.  Medical history Patient has chronic back pain, neck pain due to fusion and back surgery in the past. He has history of hypertension and he see Dr. Yetta Barre. He takes multiple medication. He recently diagnosed with diabetes and his  hemoglobin A1c is 12.4.  Alcohol and substance use history Patient denies any history of alcohol or substance use  Psychosocial history Patient lives with his wife and 5 children who are under 14. He has lived in the past in [place] and [place]. He moved to [place] due to her job is on however he is currently disabled. Since August 2011 he is unable to work and currently on disability.  Mental status examination Patient is casually dressed and fairly groomed. He maintained poor eye contact. His speech is soft clear and coherent. He described his mood is anxious and depressed and his affect was constricted. His thought process is logical linear and goal-directed. He denies any active or passive suicidal thoughts or homicidal thoughts. There no psychotic symptoms present. His attention and concentration is fair. His insight judgment and pulse control is okay.  Assessment Axis I Maj. depressive disorder recurrent Axis II deferred Axis III see medical history Axis IV mild to moderate Axis V 55-60   Plan I will increase his amitriptyline to 75 mg to target the resident to depression and insomnia. At this time patient is tolerating his medication however I explained risks and benefits of medication including sedation and postural hypotension with amitriptyline. He will continue Cymbalta 60 mg which he is getting samples from this office. I will also continue Klonopin 1 mg twice a day however I recommended to use Klonopin only for severe anxiety as a when necessary. I encouraged him to make an appointment with a therapist for increase coping and social skills. Assessment education is given healthy diet and regular exercise to control  his blood sugar. I asked him to call if he has any question or concern about the medication if he feel worsening of her symptoms otherwise I will see him in 2 months. Time spent 30 minutes

## 2011-09-30 ENCOUNTER — Emergency Department (HOSPITAL_COMMUNITY)
Admission: EM | Admit: 2011-09-30 | Discharge: 2011-10-01 | Disposition: A | Discharge status: Home Health | Payer: Self-pay | Attending: Emergency Medicine | Admitting: Emergency Medicine

## 2011-09-30 ENCOUNTER — Encounter (HOSPITAL_COMMUNITY): Payer: Self-pay | Admitting: Emergency Medicine

## 2011-09-30 DIAGNOSIS — E785 Hyperlipidemia, unspecified: Secondary | ICD-10-CM | POA: Insufficient documentation

## 2011-09-30 DIAGNOSIS — Z79899 Other long term (current) drug therapy: Secondary | ICD-10-CM | POA: Insufficient documentation

## 2011-09-30 DIAGNOSIS — R5381 Other malaise: Secondary | ICD-10-CM | POA: Insufficient documentation

## 2011-09-30 DIAGNOSIS — M1990 Unspecified osteoarthritis, unspecified site: Secondary | ICD-10-CM | POA: Insufficient documentation

## 2011-09-30 DIAGNOSIS — R5383 Other fatigue: Secondary | ICD-10-CM

## 2011-09-30 DIAGNOSIS — I10 Essential (primary) hypertension: Secondary | ICD-10-CM | POA: Insufficient documentation

## 2011-09-30 DIAGNOSIS — F411 Generalized anxiety disorder: Secondary | ICD-10-CM | POA: Insufficient documentation

## 2011-09-30 DIAGNOSIS — E119 Type 2 diabetes mellitus without complications: Secondary | ICD-10-CM | POA: Insufficient documentation

## 2011-09-30 DIAGNOSIS — R197 Diarrhea, unspecified: Secondary | ICD-10-CM

## 2011-09-30 DIAGNOSIS — R110 Nausea: Secondary | ICD-10-CM | POA: Insufficient documentation

## 2011-09-30 DIAGNOSIS — K219 Gastro-esophageal reflux disease without esophagitis: Secondary | ICD-10-CM | POA: Insufficient documentation

## 2011-09-30 LAB — BASIC METABOLIC PANEL WITH GFR
BUN: 10 mg/dL (ref 6–23)
CO2: 24 meq/L (ref 19–32)
Calcium: 9.6 mg/dL (ref 8.4–10.5)
Chloride: 102 meq/L (ref 96–112)
Creatinine, Ser: 0.95 mg/dL (ref 0.50–1.35)
GFR calc Af Amer: >90 mL/min
GFR calc non Af Amer: >90 mL/min
Glucose, Bld: 101 mg/dL — ABNORMAL HIGH (ref 70–99)
Potassium: 3.4 meq/L — ABNORMAL LOW (ref 3.5–5.1)
Sodium: 138 meq/L (ref 135–145)

## 2011-09-30 LAB — CBC
HCT: 40.6 % (ref 39.0–52.0)
Hemoglobin: 13.8 g/dL (ref 13.0–17.0)
MCH: 27.4 pg (ref 26.0–34.0)
MCHC: 34 g/dL (ref 30.0–36.0)
MCV: 80.7 fL (ref 78.0–100.0)
Platelets: 183 K/uL (ref 150–400)
RBC: 5.03 MIL/uL (ref 4.22–5.81)
RDW: 12.9 % (ref 11.5–15.5)
WBC: 6.6 K/uL (ref 4.0–10.5)

## 2011-09-30 LAB — URINALYSIS, ROUTINE W REFLEX MICROSCOPIC
Leukocytes, UA: NEGATIVE
pH: 6.5 (ref 5.0–8.0)

## 2011-09-30 LAB — DIFFERENTIAL
Basophils Absolute: 0 10*3/uL (ref 0.0–0.1)
Basophils Relative: 0 % (ref 0–1)
Eosinophils Absolute: 0.1 10*3/uL (ref 0.0–0.7)
Eosinophils Relative: 1 % (ref 0–5)
Monocytes Absolute: 0.4 10*3/uL (ref 0.1–1.0)

## 2011-09-30 LAB — GLUCOSE, CAPILLARY: Glucose-Capillary: 110 mg/dL — ABNORMAL HIGH (ref 70–99)

## 2011-09-30 NOTE — ED Notes (Signed)
CBG 110. 

## 2011-09-30 NOTE — ED Notes (Signed)
Pt alert, nad, c/o problems with BS, recently started on insulin, difficulty controlling insulin, resp even unlabored, skin pwd, speech clear, states CBG @ home was low 77.

## 2011-09-30 NOTE — ED Provider Notes (Signed)
History     CSN: [id number]  Arrival date & time 09/30/11  2023   First MD Initiated Contact with Patient 09/30/11 2113      Chief Complaint  Patient presents with  . Blood Sugar Problem    HPI The patient presents with concerns of nausea, fatigue, diarrhea. Notably, the patient is diagnosed with diabetes 2 months ago.  Following one month of Janumet, the patient began insulin therapy.  Since that time he notes that his symptoms have been persistent.  Interfering with his capacity to perform activities of daily living.  He notes multiple episodes of diarrhea daily.  He denies any vomiting.  He denies fevers, disorientation. Past Medical History  Diagnosis Date  . ERECTILE DYSFUNCTION 10/29/2010  . Chronic headaches 10/29/2010  . HYPERLIPIDEMIA 10/29/2010  . OSTEOARTHRITIS 10/29/2010  . DEPRESSION 10/29/2010    Dr Dub Mikes  . IBS (irritable bowel syndrome)   . GERD (gastroesophageal reflux disease)   . Anxiety   . Hypertension   . Eosinophilic esophagitis   . Diabetes mellitus   . Hypertension     Past Surgical History  Procedure Date  . Cervical fusion   . Cervical laminectomy 2012    Dr. Yevette Edwards  . Lumbar laminectomy 2007  . Lumbar fusion 2006  . Nasal sinus surgery   . Spinal cord stimulator implant 2011    Family History  Problem Relation Age of Onset  . Alcohol abuse Father   . Arthritis Mother   . Hypertension Father   . Hyperlipidemia Father   . Prostate cancer Father   . Stroke Paternal Grandmother   . Colon cancer Maternal Grandfather 18  . Diabetes Mother   . Heart disease Paternal Uncle     x 2  . Colon cancer Cousin 48  . Esophageal cancer Paternal Uncle   . Arthritis Father   . Hypertension Mother   . Stroke Maternal Grandmother   . Diabetes Father   . Heart disease Paternal Grandmother   . Prostate cancer Paternal Uncle     x 4  . Prostate cancer Cousin     x 2    History  Substance Use Topics  . Smoking status: Never Smoker   . Smokeless  tobacco: Never Used  . Alcohol Use: No      Review of Systems  Constitutional:       Per HPI, otherwise negative  HENT:       Per HPI, otherwise negative  Eyes: Negative.   Respiratory:       Per HPI, otherwise negative  Cardiovascular:       Per HPI, otherwise negative  Gastrointestinal: Negative for vomiting.  Genitourinary: Negative.   Musculoskeletal:       Per HPI, otherwise negative  Skin: Negative.   Neurological: Negative for syncope.    Allergies  Review of patient's allergies indicates no known allergies.  Home Medications   Current Outpatient Rx  Name Route Sig Dispense Refill  . AMITRIPTYLINE HCL 75 MG PO TABS Oral Take 1 tablet (75 mg total) by mouth at bedtime. 30 tablet 1  . CLONAZEPAM 1 MG PO TABS Oral Take 1 tablet (1 mg total) by mouth 2 (two) times daily as needed for anxiety. 60 tablet 1  . DICYCLOMINE HCL 10 MG PO CAPS Oral Take 1 capsule (10 mg total) by mouth as needed. 60 capsule 2  . DULOXETINE HCL 60 MG PO CPEP Oral Take 60 mg by mouth daily.     Marland Kitchen GABAPENTIN  300 MG PO CAPS  300 mg 4 (four) times daily. 3 tablets 3 times per day    . INSULIN GLARGINE 100 UNIT/ML [place] SOLN Subcutaneous Inject 30 Units into the skin at bedtime. 3 mL 11  . NEBIVOLOL HCL 10 MG PO TABS Oral Take 1 tablet (10 mg total) by mouth daily. 154 tablet 0  . OMEGA-3-ACID ETHYL ESTERS 1 G PO CAPS Oral Take 2 capsules (2 g total) by mouth 2 (two) times daily. 120 capsule 11  . OXYCODONE-ACETAMINOPHEN 10-325 MG PO TABS Oral Take 1 tablet by mouth every 6 (six) hours as needed for pain. Fill on or after 10/31/11 120 tablet 0  . ALIGN 4 MG PO CAPS Oral Take 1 capsule by mouth daily. 28 capsule 0    Take 1 cap daily Lot: [id number] Exp Date: 10/20 ...  . RABEPRAZOLE SODIUM 20 MG PO TBEC Oral Take 1 tablet (20 mg total) by mouth daily. 30 tablet 11    Take 1 capsule 30 min before breakfast or first me ...  . ROSUVASTATIN CALCIUM 10 MG PO TABS Oral Take 1 tablet (10 mg total) by mouth  at bedtime. 84 tablet 0  . SITAGLIPTIN-METFORMIN HCL 50-1000 MG PO TABS Oral Take 1 tablet by mouth 2 (two) times daily with a meal. 60 tablet 5    BP 144/89  Pulse 110  Temp 98 F (36.7 C)  Resp 16  Wt 223 lb (101.152 kg)  SpO2 99%  Physical Exam  Nursing note and vitals reviewed. Constitutional: He is oriented to person, place, and time. He appears well-developed. No distress.  HENT:  Head: Normocephalic and atraumatic.  Eyes: Conjunctivae and EOM are normal.  Cardiovascular: Normal rate and regular rhythm.   Pulmonary/Chest: Effort normal. No stridor. No respiratory distress.  Abdominal: He exhibits no distension.  Musculoskeletal: He exhibits no edema.  Neurological: He is alert and oriented to person, place, and time.  Skin: Skin is warm and dry.  Psychiatric: He has a normal mood and affect.    ED Course  Procedures (including critical care time)  Labs Reviewed  GLUCOSE, CAPILLARY - Abnormal; Notable for the following:    Glucose-Capillary 110 (*)    All other components within normal limits  BASIC METABOLIC PANEL  CBC  DIFFERENTIAL  URINALYSIS, ROUTINE W REFLEX MICROSCOPIC   No results found.   No diagnosis found.    MDM  This young male with newly diagnosed diabetes presents with concerns over ongoing fatigue, diarrhea, generalized discomfort.  On exam he is in no distress: Unremarkable vital signs aside from mild tachycardia.  A thorough discussion was conducted with the patient regarding the possibility of medication interactions, and appropriate anti-hyperglycemics.  Given the patient's absence of notable lab findings, he was advised to stop insulin therapy.  He should follow up with his primary care physician as well as endocrinology.        Gerhard Munch, MD 09/30/11 [phone]

## 2011-09-30 NOTE — Discharge Instructions (Signed)
Please be sure to speak with your physician about referral to an endocrinologist.  Please do not take insulin until you have now with your physician and/or endocrinologist. Please return to emergency department for any concerning changes in your condition.

## 2011-10-02 ENCOUNTER — Encounter: Payer: Self-pay | Admitting: Endocrinology

## 2011-10-02 ENCOUNTER — Encounter: Payer: Self-pay | Attending: Physical Medicine and Rehabilitation | Admitting: *Deleted

## 2011-10-02 ENCOUNTER — Encounter: Payer: Self-pay | Admitting: *Deleted

## 2011-10-02 ENCOUNTER — Ambulatory Visit (INDEPENDENT_AMBULATORY_CARE_PROVIDER_SITE_OTHER): Payer: Self-pay | Admitting: Endocrinology

## 2011-10-02 VITALS — BP 126/77 | HR 99 | Resp 18 | Ht 72.0 in | Wt 217.0 lb

## 2011-10-02 DIAGNOSIS — G8929 Other chronic pain: Secondary | ICD-10-CM | POA: Insufficient documentation

## 2011-10-02 DIAGNOSIS — R209 Unspecified disturbances of skin sensation: Secondary | ICD-10-CM

## 2011-10-02 DIAGNOSIS — M545 Low back pain, unspecified: Secondary | ICD-10-CM | POA: Insufficient documentation

## 2011-10-02 DIAGNOSIS — Z981 Arthrodesis status: Secondary | ICD-10-CM | POA: Insufficient documentation

## 2011-10-02 DIAGNOSIS — IMO0001 Reserved for inherently not codable concepts without codable children: Secondary | ICD-10-CM

## 2011-10-02 DIAGNOSIS — M542 Cervicalgia: Secondary | ICD-10-CM | POA: Insufficient documentation

## 2011-10-02 DIAGNOSIS — Q762 Congenital spondylolisthesis: Secondary | ICD-10-CM | POA: Insufficient documentation

## 2011-10-02 DIAGNOSIS — M79609 Pain in unspecified limb: Secondary | ICD-10-CM

## 2011-10-02 DIAGNOSIS — G894 Chronic pain syndrome: Secondary | ICD-10-CM

## 2011-10-02 NOTE — Progress Notes (Signed)
Reports fair relief w/ current pain med. Reports some numbness in anterior left thigh but no numbness in feet. Denies recent falls.

## 2011-10-02 NOTE — Patient Instructions (Addendum)
good diet and exercise habits significanly improve the control of your diabetes.  please let me know if you wish to be referred to a dietician.  high blood sugar is very risky to your health.  you should see an eye doctor every year. controlling your blood pressure and cholesterol drastically reduces the damage diabetes does to your body.  this also applies to quitting smoking.  please discuss these with your doctor.  you should take an aspirin every day, unless you have been advised by a doctor not to. check your blood sugar 1 time a day.  vary the time of day when you check, between before the 3 meals, and at bedtime.  also check if you have symptoms of your blood sugar being too high or too low.  please keep a record of the readings and bring it to your next appointment here.  please call us sooner if your blood sugar goes below 70, or if it stays over 200.   Please come back for a follow-up appointment for 1 month.   Here are some samples of janumet-XR 50/1000.  Take 1 pill, 2x a day.  We'll see if changing to the -XR helps your nausea and diarrhea.

## 2011-10-02 NOTE — Progress Notes (Signed)
  Subjective:    Patient ID: Dennis Conley, male    DOB: Apr 16, 1961, 51 y.o.   MRN: [id number]  HPI    Review of Systems     Objective:   Physical Exam        Assessment & Plan:

## 2011-10-02 NOTE — Progress Notes (Signed)
Subjective:    Patient ID: Dennis Conley, male    DOB: 29-Aug-1960, 51 y.o.   MRN: [id number]  HPI pt was dx'ed with DM approx 2 mos ago.  He was tried on oral agents, but he did not achieve good control on these.  Insulin was added approx 1 month ago.  On lantus 30 units QD, he was seen at ER 2 days ago with hypoglycemia, so insulin was stopped.  He now takes only janumet.  In the 2 days since, he says cbg's are low-100's.  he is unaware of any chronic complications.  pt says he eats very little, due to nausea.  exercise is limited by med probs.    He reports few years of severe pain at the lower back, with assoc numbness at the left lower extremity. Past Medical History  Diagnosis Date  . ERECTILE DYSFUNCTION 10/29/2010  . Chronic headaches 10/29/2010  . HYPERLIPIDEMIA 10/29/2010  . OSTEOARTHRITIS 10/29/2010  . DEPRESSION 10/29/2010    Dr Dub Mikes  . IBS (irritable bowel syndrome)   . GERD (gastroesophageal reflux disease)   . Anxiety   . Hypertension   . Eosinophilic esophagitis   . Diabetes mellitus   . Hypertension     Past Surgical History  Procedure Date  . Cervical fusion   . Cervical laminectomy 2012    Dr. Yevette Edwards  . Lumbar laminectomy 2007  . Lumbar fusion 2006  . Nasal sinus surgery   . Spinal cord stimulator implant 2011    History   Social History  . Marital Status: Married    Spouse Name: N/A    Number of Children: 5  . Years of Education: N/A   Occupational History  . Disabled    Social History Main Topics  . Smoking status: Never Smoker   . Smokeless tobacco: Never Used  . Alcohol Use: No  . Drug Use: No  . Sexually Active: Yes   Other Topics Concern  . Not on file   Social History Narrative   3 caffeine drinks daily     Current Outpatient Prescriptions on File Prior to Visit  Medication Sig Dispense Refill  . amitriptyline (ELAVIL) 75 MG tablet Take 1 tablet (75 mg total) by mouth at bedtime.  30 tablet  1  . clonazePAM (KLONOPIN) 1 MG tablet  Take 1 tablet (1 mg total) by mouth 2 (two) times daily as needed for anxiety.  60 tablet  1  . dicyclomine (BENTYL) 10 MG capsule Take 1 capsule (10 mg total) by mouth as needed.  60 capsule  2  . DULoxetine (CYMBALTA) 60 MG capsule Take 60 mg by mouth daily.       . nebivolol (BYSTOLIC) 10 MG tablet Take 1 tablet (10 mg total) by mouth daily.  154 tablet  0  . omega-3 acid ethyl esters (LOVAZA) 1 G capsule Take 2 capsules (2 g total) by mouth 2 (two) times daily.  120 capsule  11  . oxyCODONE-acetaminophen (PERCOCET) 10-325 MG per tablet Take 1 tablet by mouth every 6 (six) hours as needed for pain. Fill on or after 10/31/11  120 tablet  0  . Probiotic Product (ALIGN) 4 MG CAPS Take 1 capsule by mouth daily.  28 capsule  0  . RABEprazole (ACIPHEX) 20 MG tablet Take 1 tablet (20 mg total) by mouth daily.  30 tablet  11  . rosuvastatin (CRESTOR) 10 MG tablet Take 1 tablet (10 mg total) by mouth at bedtime.  84 tablet  0  .  sitaGLIPtan-metformin (JANUMET) 50-1000 MG per tablet Take 1 tablet by mouth 2 (two) times daily with a meal.  60 tablet  5  . insulin glargine (LANTUS SOLOSTAR) 100 UNIT/ML injection Inject 30 Units into the skin at bedtime.  3 mL  11    No Known Allergies  Family History  Problem Relation Age of Onset  . Alcohol abuse Father   . Arthritis Mother   . Hypertension Father   . Hyperlipidemia Father   . Prostate cancer Father   . Stroke Paternal Grandmother   . Colon cancer Maternal Grandfather 52  . Diabetes Mother   . Heart disease Paternal Uncle     x 2  . Colon cancer Cousin 48  . Esophageal cancer Paternal Uncle   . Arthritis Father   . Hypertension Mother   . Stroke Maternal Grandmother   . Diabetes Father   . Heart disease Paternal Grandmother   . Prostate cancer Paternal Uncle     x 4  . Prostate cancer Cousin     x 2    BP 122/88  Pulse 102  Temp(Src) 98.1 F (36.7 C) (Oral)  Ht 6\' 2"  (1.88 m)  Wt 217 lb 9.6 oz (98.703 kg)  BMI 27.94 kg/m2  SpO2  96%  Review of Systems denies chest pain, sob, n/v, urinary frequency, cramps, memory loss, and easy bruising.  He has lost 12 lbs x 2 weeks.  Denies vomiting, but he has diarrhea.  He has blurry vision x a few mos.  He reports headache, rhinorrhea, depression, erectile dysfunction,  and excessive diaphoresis.      Objective:   Physical Exam VS: see vs page GEN: no distress HEAD: head: no deformity eyes: no periorbital swelling, no proptosis external nose and ears are normal mouth: no lesion seen NECK: supple, thyroid is not enlarged CHEST WALL: no deformity LUNGS: clear to auscultation BREASTS:  No gynecomastia CV: reg rate and rhythm, no murmur ABD: abdomen is soft, nontender.  no hepatosplenomegaly.  not distended.  no hernia MUSCULOSKELETAL: muscle bulk and strength are grossly normal.  no obvious joint swelling.  gait is normal and steady EXTEMITIES: no deformity.  no ulcer on the feet.  feet are of normal color and temp.  no edema PULSES: dorsalis pedis intact bilat.  no carotid bruit NEURO:  cn 2-12 grossly intact.   readily moves all 4's.  sensation is intact to touch on the feet SKIN:  Normal texture and temperature.  No rash or suspicious lesion is visible.   NODES:  None palpable at the neck PSYCH: alert, oriented x3.  Does not appear anxious nor depressed.  Lab Results  Component Value Date   HGBA1C 12.4* 07/31/2011      Assessment & Plan:  DM, needs increased rx Nausea and diarrhea, prob due to metformin Low-back pain.  This limits exercise rx of DM

## 2011-10-02 NOTE — Patient Instructions (Signed)
Remember you can call to pick up a prescription rather than run out in between appointments, if you can't keep monthly appointment. Just has to be approved by Dr Pamelia Hoit

## 2011-10-03 ENCOUNTER — Telehealth: Payer: Self-pay

## 2011-10-03 MED ORDER — MORPHINE SULFATE CR 15 MG PO TB12: 15.0000 mg | ORAL_TABLET | Freq: Three times a day (TID) | ORAL | Status: DC

## 2011-10-03 NOTE — Telephone Encounter (Signed)
Disability forms were left on desk for MD to complete. Forms completed and call made back to patient and [employer] [phone]). Forms faxed back to [phone] per [employer], [place] mailed to pt and copy scanned

## 2011-10-03 NOTE — Progress Notes (Signed)
Addended by: Jniya Madara A on: 10/03/2011 08:24 AM   Modules accepted: Orders  

## 2011-10-16 ENCOUNTER — Ambulatory Visit: Payer: Self-pay | Admitting: Physical Medicine and Rehabilitation

## 2011-10-16 ENCOUNTER — Ambulatory Visit: Payer: Self-pay | Admitting: Internal Medicine

## 2011-10-28 ENCOUNTER — Encounter: Payer: Self-pay | Admitting: Physical Medicine and Rehabilitation

## 2011-10-28 ENCOUNTER — Encounter: Payer: Self-pay | Attending: Neurosurgery | Admitting: Physical Medicine and Rehabilitation

## 2011-10-28 VITALS — BP 135/86 | HR 74 | Resp 16 | Ht 72.0 in | Wt 231.0 lb

## 2011-10-28 DIAGNOSIS — Z79899 Other long term (current) drug therapy: Secondary | ICD-10-CM | POA: Insufficient documentation

## 2011-10-28 DIAGNOSIS — M545 Low back pain, unspecified: Secondary | ICD-10-CM | POA: Insufficient documentation

## 2011-10-28 DIAGNOSIS — M961 Postlaminectomy syndrome, not elsewhere classified: Secondary | ICD-10-CM | POA: Insufficient documentation

## 2011-10-28 DIAGNOSIS — G8929 Other chronic pain: Secondary | ICD-10-CM | POA: Insufficient documentation

## 2011-10-28 DIAGNOSIS — M79609 Pain in unspecified limb: Secondary | ICD-10-CM | POA: Insufficient documentation

## 2011-10-28 DIAGNOSIS — M79606 Pain in leg, unspecified: Secondary | ICD-10-CM

## 2011-10-28 DIAGNOSIS — IMO0002 Reserved for concepts with insufficient information to code with codable children: Secondary | ICD-10-CM | POA: Insufficient documentation

## 2011-10-28 NOTE — Patient Instructions (Signed)
We are referring into the [facility].  You have been given a brochure regarding this treatment center.

## 2011-10-28 NOTE — Progress Notes (Signed)
  Subjective:    Patient ID: Dennis Conley, male    DOB: 04/25/1961, 51 y.o.   MRN: [id number]  HPI The patient is a 51 year old man last seen by me 07/01/2011. In the interim he's been followed by nurse practitioner.  He is back in today for a refill of pain medication. He has been using MS Contin 15 mg 3 times a day.  He is requested a change in his pain medication again. He had been on Percocet and had previously been under a lot of in the past and is requesting switching back to that.  Pain is localized to the low back repeat down the left lower extremity. Pain scores 9-10 on a scale of 10 sleep is fair pain is worse with activity improves with medication  Patient reports he can walk 2 minutes  He reports good relief current meds  Denies problems with bladder control has a history of irritable bowel  Pain Inventory Average Pain 9 Pain Right Now 10 My pain is constant, sharp, dull and aching  In the last 24 hours, has pain interfered with the following? General activity 9 Relation with others 8 Enjoyment of life 10 What TIME of day is your pain at its worst? morning and evening Sleep (in general) Fair  Pain is worse with: walking, bending, sitting and standing Pain improves with: medication Relief from Meds: 2  Mobility walk without assistance how many minutes can you walk? 2 ability to climb steps?  yes do you drive?  yes Do you have any goals in this area?  yes  Function disabled: date disabled 04/09/2010 Do you have any goals in this area?  no  Neuro/Psych bowel control problems numbness spasms  Prior Studies Any changes since last visit?  no  Physicians involved in your care Primary care Dennis Conley Dr Dennis Conley [facility], Dr Dennis Conley for diabetes     Review of Systems  Neurological: Positive for numbness.  Conley other systems reviewed and are negative.       Objective:   Physical Exam  Patient is alert and oriented cooperative pleasant follows commands  without difficulty and his questions appropriately  Cranial nerves are grossly intact coordination is intact reflexes are 2+ in the upper extremities and lower extremities  No abnormal tone clonus or tremors are noted  Manual muscle testing reveals good strength in both upper and lower extremities  No new sensory deficits are noted previous olds sensory deficit over left thigh  Limitations of motion noted and cervical spine as well as lumbar spine.        Assessment & Plan:  1. 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 Last urine drug screen number 06/01/2011 was consistent  [place] controlled substance reporting system was reviewed while the patient was in the office. The report shows the patient was obtaining narcotic pain medication from our clinic as well as from another office. This is in violation of our controlled substance agreement.  I have asked the patient if he is interested in a referral to help him. We have suggested the [facility] possibly behavioral health.  He asked for information regarding the [facility] requested that we make a referral.

## 2011-10-30 ENCOUNTER — Ambulatory Visit: Payer: Self-pay | Admitting: Internal Medicine

## 2011-10-30 DIAGNOSIS — Z0289 Encounter for other administrative examinations: Secondary | ICD-10-CM

## 2011-11-01 ENCOUNTER — Telehealth: Payer: Self-pay | Admitting: Physical Medicine and Rehabilitation

## 2011-11-01 ENCOUNTER — Other Ambulatory Visit: Payer: Self-pay | Admitting: *Deleted

## 2011-11-01 ENCOUNTER — Other Ambulatory Visit: Payer: Self-pay | Admitting: Internal Medicine

## 2011-11-01 MED ORDER — GABAPENTIN 600 MG PO TABS: 600.0000 mg | ORAL_TABLET | Freq: Three times a day (TID) | ORAL | Status: DC

## 2011-11-01 NOTE — Telephone Encounter (Signed)
Dennis Conley was seen in clinic recently. Using the [facility] controlled substance reporting system was noted that [name] was obtaining narcotics through another provider in addition to our clinic. He been using morphine through our clinic and oxycodone through the other clinic. His urine drug screen was consistent only for the medication our clinic was providing.

## 2011-11-01 NOTE — Telephone Encounter (Signed)
This was already documented once today

## 2011-11-20 ENCOUNTER — Other Ambulatory Visit (INDEPENDENT_AMBULATORY_CARE_PROVIDER_SITE_OTHER): Payer: Self-pay

## 2011-11-20 ENCOUNTER — Encounter: Payer: Self-pay | Admitting: Internal Medicine

## 2011-11-20 ENCOUNTER — Ambulatory Visit (INDEPENDENT_AMBULATORY_CARE_PROVIDER_SITE_OTHER): Payer: Self-pay | Admitting: Internal Medicine

## 2011-11-20 ENCOUNTER — Ambulatory Visit (INDEPENDENT_AMBULATORY_CARE_PROVIDER_SITE_OTHER): Payer: Self-pay | Admitting: Psychiatry

## 2011-11-20 ENCOUNTER — Encounter (HOSPITAL_COMMUNITY): Payer: Self-pay | Admitting: Psychiatry

## 2011-11-20 VITALS — BP 122/80 | HR 80 | Temp 98.7°F | Resp 16 | Wt 227.0 lb

## 2011-11-20 DIAGNOSIS — I739 Peripheral vascular disease, unspecified: Secondary | ICD-10-CM

## 2011-11-20 DIAGNOSIS — R799 Abnormal finding of blood chemistry, unspecified: Secondary | ICD-10-CM

## 2011-11-20 DIAGNOSIS — F329 Major depressive disorder, single episode, unspecified: Secondary | ICD-10-CM

## 2011-11-20 DIAGNOSIS — I10 Essential (primary) hypertension: Secondary | ICD-10-CM

## 2011-11-20 DIAGNOSIS — E7800 Pure hypercholesterolemia, unspecified: Secondary | ICD-10-CM

## 2011-11-20 DIAGNOSIS — N400 Enlarged prostate without lower urinary tract symptoms: Secondary | ICD-10-CM

## 2011-11-20 DIAGNOSIS — R778 Other specified abnormalities of plasma proteins: Secondary | ICD-10-CM

## 2011-11-20 LAB — CBC WITH DIFFERENTIAL/PLATELET
Basophils Absolute: 0 10*3/uL (ref 0.0–0.1)
Basophils Relative: 0.4 % (ref 0.0–3.0)
Eosinophils Absolute: 0.2 10*3/uL (ref 0.0–0.7)
Hemoglobin: 14.6 g/dL (ref 13.0–17.0)
Lymphocytes Relative: 25.7 % (ref 12.0–46.0)
MCHC: 32.6 g/dL (ref 30.0–36.0)
Monocytes Relative: 6 % (ref 3.0–12.0)
Neutro Abs: 4.1 10*3/uL (ref 1.4–7.7)
Neutrophils Relative %: 65.5 % (ref 43.0–77.0)
RBC: 5.23 Mil/uL (ref 4.22–5.81)
RDW: 14.4 % (ref 11.5–14.6)

## 2011-11-20 LAB — TSH: TSH: 2.9 u[IU]/mL (ref 0.35–5.50)

## 2011-11-20 LAB — URINALYSIS, ROUTINE W REFLEX MICROSCOPIC
Bilirubin Urine: NEGATIVE
Hgb urine dipstick: NEGATIVE
Total Protein, Urine: 30
Urine Glucose: NEGATIVE
Urobilinogen, UA: 1 (ref 0.0–1.0)

## 2011-11-20 LAB — COMPREHENSIVE METABOLIC PANEL
ALT: 50 U/L (ref 0–53)
AST: 24 U/L (ref 0–37)
Albumin: 5.2 g/dL (ref 3.5–5.2)
CO2: 27 mEq/L (ref 19–32)
Calcium: 10.1 mg/dL (ref 8.4–10.5)
Chloride: 103 mEq/L (ref 96–112)
Creatinine, Ser: 1 mg/dL (ref 0.4–1.5)
GFR: 97.61 mL/min (ref >60.00)
Potassium: 4.4 mEq/L (ref 3.5–5.1)

## 2011-11-20 LAB — FECAL OCCULT BLOOD, GUAIAC: Fecal Occult Blood: NEGATIVE

## 2011-11-20 LAB — HM DIABETES FOOT EXAM

## 2011-11-20 LAB — LDL CHOLESTEROL, DIRECT: Direct LDL: 138.7 mg/dL

## 2011-11-20 LAB — LIPID PANEL
Cholesterol: 205 mg/dL — ABNORMAL HIGH (ref 0–200)
HDL: 44.4 mg/dL (ref >39.00)
Triglycerides: 159 mg/dL — ABNORMAL HIGH (ref 0.0–149.0)
VLDL: 31.8 mg/dL (ref 0.0–40.0)

## 2011-11-20 LAB — PSA: PSA: 0.53 ng/mL (ref 0.10–4.00)

## 2011-11-20 MED ORDER — AMITRIPTYLINE HCL 75 MG PO TABS: 75.0000 mg | ORAL_TABLET | Freq: Every day | ORAL | Status: DC

## 2011-11-20 MED ORDER — OLMESARTAN MEDOXOMIL 20 MG PO TABS: 20.0000 mg | ORAL_TABLET | Freq: Every day | ORAL | Status: DC

## 2011-11-20 NOTE — Patient Instructions (Signed)

## 2011-11-20 NOTE — Assessment & Plan Note (Signed)
I will recheck his FLP today 

## 2011-11-20 NOTE — Progress Notes (Signed)
Chief complaint Medication management and followup.    History of presenting illness Patient is 51 year old [place] American male who came for his followup appointment.  He has recently improved for his Social Security disability .  He is relieved .  He likes increase amitriptyline .  He is sleeping better.  Denies any major panic attack.  However he continues to have residual anxiety and nervousness.  He recently visited [facility] with the family and had a good time.  He continues to have concern about his physical health .  He is taking medication for his diabetes however he continues to have difficulty controlling his blood sugar despite watching his diet .  He is worried about his future and physical health but recently denies any active or passive suicidal thinking.  He denies any side effects of medication.  He was taking Klonopin one a day but her anxiety gets worse and last week he is again taking Klonopin twice a day .  He does not drink or use any illegal substance.  He does not ask for early refill of Klonopin.  He denies any shakes, tremors or any extrapyramidal side effects.  He is schedule to have blood test today at his primary care physician's office.  Current psychiatric medication Klonopin 1 mg up to twice a day Cymbalta 60 mg Amitriptyline 75 mg at bedtime  Past psychiatric history Patient has at least 2 intensive outpatient program in the past due to depression and anxiety. He admitted taking Klonopin and Xanax at a very early age prescribed by primary care Dr. in [place]. He denies any history of suicidal attempt but endorse anxiety and depression. He denies any inpatient psychiatric treatment. He denies any paranoia or psychosis. He denies any history of violence.  Medical history Patient has chronic back pain, neck pain due to fusion and back surgery in the past. He has history of hypertension and he see Dr. Yetta Barre. He takes multiple medication. He recently diagnosed with  diabetes and his hemoglobin A1c is 12.4.  Alcohol and substance use history Patient denies any history of alcohol or substance use  Psychosocial history Patient lives with his wife and 5 children who are under 14. He has lived in the past in [place] and [place]. He moved to [place] due to her job is on however he is currently disabled. Since August 2011 he is unable to work and currently on disability.  Mental status examination Patient is casually dressed and fairly groomed. He maintained fair eye contact. His speech is soft clear and coherent. He described his mood is anxious and depressed and his affect was constricted. His thought process is logical linear and goal-directed. He denies any active or passive suicidal thoughts or homicidal thoughts. There no psychotic symptoms present. His attention and concentration is fair. His insight judgment and pulse control is okay.  Assessment Axis I Maj. depressive disorder recurrent Axis II deferred Axis III see medical history Axis IV mild to moderate Axis V 55-60   Plan I will continue his amitriptyline to 75 mg at bedtime.  He is also taking Cymbalta and Klonopin as needed.  He does not need a new prescription of Klonopin since he is using leftover.  I encouraged him to take Klonopin as needed .  I explained risks and benefits of medication.  Patient is scheduled to have blood work today for his hemoglobin A1c .  I asked him to send [place] a copy of the results.  I recommended  to call us if he has a question or concern about the medication otherwise I will see him again in 2 months.

## 2011-11-20 NOTE — Assessment & Plan Note (Signed)
He needs to have arterial flow studies done

## 2011-11-20 NOTE — Assessment & Plan Note (Signed)
Stop bystolic at his request and change to benicar

## 2011-11-20 NOTE — Assessment & Plan Note (Signed)
I will check his PSA today, he does not want to start a med for symptom relief

## 2011-11-20 NOTE — Assessment & Plan Note (Signed)
He has stopped insulin, I will check his a1c and his renal function today

## 2011-11-20 NOTE — Progress Notes (Signed)
Subjective:    Patient ID: Dennis Conley, male    DOB: 21-Mar-1961, 51 y.o.   MRN: [id number]  Diabetes He presents for his follow-up diabetic visit. He has type 2 diabetes mellitus. His disease course has been fluctuating. Hypoglycemia symptoms include sweats. Pertinent negatives for hypoglycemia include no confusion, dizziness, headaches, hunger, mood changes, nervousness/anxiousness, pallor, seizures, sleepiness, speech difficulty or tremors. Pertinent negatives for diabetes include no blurred vision, no chest pain, no fatigue, no foot paresthesias, no foot ulcerations, no polydipsia, no polyphagia, no polyuria, no visual change, no weakness and no weight loss. Hypoglycemia complications include hospitalization and required assistance. Symptoms are stable. Diabetic complications include PVD. Current diabetic treatment includes oral agent (dual therapy). He is compliant with treatment all of the time. His weight is increasing steadily. He is following a generally healthy diet. Meal planning includes avoidance of concentrated sweets. He has not had a previous visit with a dietician. He never participates in exercise. There is no change in his home blood glucose trend. An ACE inhibitor/angiotensin II receptor blocker is not being taken. He does not see a podiatrist.Eye exam is not current.  Hypertension This is a chronic problem. The current episode started more than 1 year ago. The problem has been gradually improving since onset. The problem is controlled. Associated symptoms include sweats. Pertinent negatives include no anxiety, blurred vision, chest pain, headaches, malaise/fatigue, neck pain, orthopnea, palpitations, peripheral edema, PND or shortness of breath. Past treatments include beta blockers. The current treatment provides significant improvement. Compliance problems include medication side effects (he thinks that bystolic makes him feel sweaty).  Hypertensive end-organ damage includes PVD.    Benign Prostatic Hypertrophy This is a recurrent problem. The current episode started more than 1 month ago. The problem is unchanged. Irritative symptoms do not include frequency, nocturia or urgency. Obstructive symptoms include dribbling. Obstructive symptoms do not include incomplete emptying, an intermittent stream, a slower stream, straining or a weak stream. Associated symptoms include hesitancy. Pertinent negatives include no chills, dysuria, genital pain, hematuria, nausea or vomiting. AUA score is 0-7. He is sexually active.      Review of Systems  Constitutional: Positive for unexpected weight change (some weight gain). Negative for fever, chills, weight loss, malaise/fatigue, diaphoresis, activity change, appetite change and fatigue.  HENT: Negative.  Negative for neck pain.   Eyes: Negative.  Negative for blurred vision.  Respiratory: Negative for apnea, cough, choking, chest tightness, shortness of breath, wheezing and stridor.   Cardiovascular: Negative for chest pain, palpitations, orthopnea and PND.  Gastrointestinal: Positive for constipation. Negative for nausea, vomiting, abdominal pain, diarrhea, blood in stool, abdominal distention, anal bleeding and rectal pain.  Genitourinary: Positive for hesitancy. Negative for dysuria, urgency, polyuria, frequency, hematuria, flank pain, decreased urine volume, discharge, penile swelling, scrotal swelling, enuresis, difficulty urinating, genital sores, penile pain, testicular pain, incomplete emptying and nocturia.  Musculoskeletal: Positive for back pain. Negative for myalgias, joint swelling, arthralgias and gait problem.  Skin: Negative for color change, pallor, rash and wound.  Neurological: Negative.  Negative for dizziness, tremors, seizures, speech difficulty, weakness and headaches.  Hematological: Negative for polydipsia, polyphagia and adenopathy. Does not bruise/bleed easily.  Psychiatric/Behavioral: Negative.  Negative for  confusion. The patient is not nervous/anxious.        Objective:   Physical Exam  Vitals reviewed. Constitutional: He is oriented to person, place, and time. He appears well-developed and well-nourished. No distress.  HENT:  Head: Normocephalic and atraumatic.  Mouth/Throat: Oropharynx is clear and moist.  No oropharyngeal exudate.  Eyes: Conjunctivae are normal. Right eye exhibits no discharge. Left eye exhibits no discharge. No scleral icterus.  Neck: Normal range of motion. Neck supple. No JVD present. No tracheal deviation present. No thyromegaly present.  Cardiovascular: Normal rate, regular rhythm, normal heart sounds and intact distal pulses.  Exam reveals decreased pulses. Exam reveals no gallop and no friction rub.   No murmur heard. Pulses:      Carotid pulses are 1+ on the right side, and 1+ on the left side.      Radial pulses are 1+ on the right side, and 1+ on the left side.       Femoral pulses are 1+ on the right side, and 1+ on the left side.      Popliteal pulses are 0 on the right side, and 0 on the left side.       Dorsalis pedis pulses are 0 on the right side, and 0 on the left side.       Posterior tibial pulses are 0 on the right side, and 0 on the left side.  Pulmonary/Chest: Effort normal and breath sounds normal. No stridor. No respiratory distress. He has no wheezes. He has no rales. He exhibits no tenderness.  Abdominal: Soft. Bowel sounds are normal. He exhibits no distension and no mass. There is no tenderness. There is no rebound and no guarding. Hernia confirmed negative in the right inguinal area and confirmed negative in the left inguinal area.  Genitourinary: Rectum normal, testes normal and penis normal. Rectal exam shows no external hemorrhoid, no internal hemorrhoid, no fissure, no mass, no tenderness and anal tone normal. Guaiac negative stool. Prostate is enlarged (2+ bilateral smooth BPH). Prostate is not tender. Right testis shows no mass, no swelling  and no tenderness. Right testis is descended. Left testis shows no mass, no swelling and no tenderness. Left testis is descended. Circumcised. No penile tenderness. No discharge found.  Musculoskeletal: Normal range of motion. He exhibits no edema and no tenderness.  Lymphadenopathy:    He has no cervical adenopathy.       Right: No inguinal adenopathy present.       Left: No inguinal adenopathy present.  Neurological: He is alert and oriented to person, place, and time. He has normal reflexes. He displays normal reflexes. No cranial nerve deficit. He exhibits normal muscle tone. Coordination normal.  Skin: Skin is warm and dry. No rash noted. He is not diaphoretic. No erythema. No pallor.  Psychiatric: He has a normal mood and affect. His behavior is normal. Judgment and thought content normal.      Lab Results  Component Value Date   WBC 6.6 09/30/2011   HGB 13.8 09/30/2011   HCT 40.6 09/30/2011   PLT 183 09/30/2011   GLUCOSE 101* 09/30/2011   CHOL 225* 07/31/2011   TRIG 364.0* 07/31/2011   HDL 41.10 07/31/2011   LDLDIRECT 131.9 07/31/2011   ALT 85* 07/31/2011   AST 59* 07/31/2011   NA 138 09/30/2011   K 3.4* 09/30/2011   CL 102 09/30/2011   CREATININE 0.95 09/30/2011   BUN 10 09/30/2011   CO2 24 09/30/2011   TSH 1.10 07/31/2011   INR 1.06 09/12/2010   HGBA1C 12.4* 07/31/2011   MICROALBUR 18.6* 07/31/2011      Assessment & Plan:

## 2011-11-21 ENCOUNTER — Telehealth: Payer: Self-pay

## 2011-11-21 DIAGNOSIS — M549 Dorsalgia, unspecified: Secondary | ICD-10-CM

## 2011-11-21 NOTE — Telephone Encounter (Signed)
done

## 2011-11-21 NOTE — Telephone Encounter (Signed)
Pt called requesting referral to [facility]. Pt says that this was discussed at last OV.

## 2011-11-22 ENCOUNTER — Encounter: Payer: Self-pay | Admitting: Internal Medicine

## 2011-11-22 DIAGNOSIS — R778 Other specified abnormalities of plasma proteins: Secondary | ICD-10-CM | POA: Insufficient documentation

## 2011-11-26 ENCOUNTER — Encounter (INDEPENDENT_AMBULATORY_CARE_PROVIDER_SITE_OTHER): Payer: Self-pay

## 2011-11-26 DIAGNOSIS — I739 Peripheral vascular disease, unspecified: Secondary | ICD-10-CM

## 2011-12-10 ENCOUNTER — Telehealth: Payer: Self-pay | Admitting: Physical Medicine and Rehabilitation

## 2011-12-10 ENCOUNTER — Encounter: Payer: Self-pay | Admitting: Physical Medicine and Rehabilitation

## 2011-12-10 NOTE — Telephone Encounter (Signed)
The patient came into office to sign a release of records, wanted to make an appt to go over notes with Dr. Pamelia Hoit. I pulled up last office note to make sure we could make an appt, and the note had patient to follow up with the [facility].  I asked patient if they had done that, and he replied no.  Not sure what to do with this patient.  He would like for someone to call him on his cell phone [phone]

## 2011-12-11 ENCOUNTER — Encounter: Payer: Self-pay | Admitting: Physical Medicine and Rehabilitation

## 2011-12-11 NOTE — Telephone Encounter (Signed)
Pt aware that we don't normally allow patients to come in to have an appointment just to review records after they have been discharged.

## 2011-12-11 NOTE — Telephone Encounter (Signed)
LM with a male to have pt call us back 

## 2011-12-19 ENCOUNTER — Encounter: Payer: Self-pay | Admitting: Internal Medicine

## 2011-12-23 ENCOUNTER — Ambulatory Visit (INDEPENDENT_AMBULATORY_CARE_PROVIDER_SITE_OTHER): Payer: Self-pay | Admitting: Internal Medicine

## 2011-12-23 ENCOUNTER — Encounter: Payer: Self-pay | Admitting: Internal Medicine

## 2011-12-23 VITALS — BP 134/92 | HR 114 | Ht 74.0 in | Wt 234.5 lb

## 2011-12-23 DIAGNOSIS — K219 Gastro-esophageal reflux disease without esophagitis: Secondary | ICD-10-CM

## 2011-12-23 DIAGNOSIS — K5900 Constipation, unspecified: Secondary | ICD-10-CM

## 2011-12-23 DIAGNOSIS — K200 Eosinophilic esophagitis: Secondary | ICD-10-CM

## 2011-12-23 MED ORDER — RABEPRAZOLE SODIUM 20 MG PO TBEC: 20.0000 mg | DELAYED_RELEASE_TABLET | Freq: Every day | ORAL | Status: AC

## 2011-12-23 MED ORDER — FLUTICASONE PROPIONATE HFA 220 MCG/ACT IN AERO: INHALATION_SPRAY | RESPIRATORY_TRACT | Status: AC

## 2011-12-23 MED ORDER — POLYETHYLENE GLYCOL 3350 17 GM/SCOOP PO POWD: 17.0000 g | Freq: Every day | ORAL | Status: AC

## 2011-12-23 NOTE — Progress Notes (Signed)
Subjective:    Patient ID: Dennis Conley, male    DOB: 1961/06/25, 51 y.o.   MRN: [id number]  HPI Dennis Conley is a 51 yo male with PMH of hypertension, hyperlipidemia, depression, DM-2, and GERD with eosinophilic esophagitis who seen for followup.  The patient reports he has had a return of his esophageal dysphagia. This is for both solids and liquids. He was diagnosed with eosinophilic esophagitis and 2012, and treated with PPI and fluticasone. It did respond well initially, but has returned. He has remained on PPI with AcipHex 20 mg daily. He is not having significant GERD or heartburn related symptoms. Dysphagia remains his biggest upper GI complaint. He has not had nausea or vomiting. He does continue to have some abdominal bloating which is worse after eating. No abdominal pain. His appetite has been good. No weight loss. He continues to work with his primary doctor on glucose control. From a bowel movement standpoint, he was having constipation, but is having great success with MiraLAX 17 g daily. He reports if he misses a day he does develop constipation, but when he is taking it daily is having no trouble with constipation. No diarrhea, bright red blood per rectum or melena.  Review of Systems As per history of present illness, otherwise negative  Patient Active Problem List  Diagnoses  . ERECTILE DYSFUNCTION  . DEPRESSION  . OSTEOARTHRITIS  . Preventative health care  . Chronic back pain  . Essential hypertension, benign  . Pure hypercholesterolemia  . GERD (gastroesophageal reflux disease)  . Type II or unspecified type diabetes mellitus without mention of complication, uncontrolled  . Eosinophilic esophagitis  . BPH (benign prostatic hyperplasia)  . Claudication  . Elevated total protein   Current Outpatient Prescriptions  Medication Sig Dispense Refill  . amitriptyline (ELAVIL) 75 MG tablet Take 1 tablet (75 mg total) by mouth at bedtime.  30 tablet  1  . clonazePAM (KLONOPIN)  1 MG tablet Take 1 tablet (1 mg total) by mouth 2 (two) times daily as needed for anxiety.  60 tablet  1  . dicyclomine (BENTYL) 10 MG capsule Take 1 capsule (10 mg total) by mouth as needed.  60 capsule  2  . DULoxetine (CYMBALTA) 60 MG capsule Take 60 mg by mouth daily.       Marland Kitchen gabapentin (NEURONTIN) 600 MG tablet Take 1 tablet (600 mg total) by mouth 3 (three) times daily. 1 in am, 1 in pm, 2 at HS  120 tablet  0  . olmesartan (BENICAR) 20 MG tablet Take 1 tablet (20 mg total) by mouth daily.  56 tablet  0  . omega-3 acid ethyl esters (LOVAZA) 1 G capsule Take 2 capsules (2 g total) by mouth 2 (two) times daily.  120 capsule  11  . Probiotic Product (ALIGN) 4 MG CAPS Take 1 capsule by mouth daily.  28 capsule  0  . RABEprazole (ACIPHEX) 20 MG tablet Take 1 tablet (20 mg total) by mouth daily.  30 tablet  11  . rosuvastatin (CRESTOR) 10 MG tablet Take 1 tablet (10 mg total) by mouth at bedtime.  84 tablet  0  . sitaGLIPtan-metformin (JANUMET) 50-1000 MG per tablet Take 1 tablet by mouth 2 (two) times daily with a meal.      . DISCONTD: RABEprazole (ACIPHEX) 20 MG tablet Take 1 tablet (20 mg total) by mouth daily.  30 tablet  11  . fluticasone (FLOVENT HFA) 220 MCG/ACT inhaler 2 puffs swallowed  Twice daily.  1 Inhaler  12  . polyethylene glycol powder (GLYCOLAX/MIRALAX) powder Take 17 g by mouth daily.  255 g  3   Allergies  Allergen Reactions  . Bystolic (Nebivolol Hcl)     Makes him sweat too much   Family History  Problem Relation Age of Onset  . Alcohol abuse Father   . Arthritis Mother   . Hypertension Father   . Hyperlipidemia Father   . Prostate cancer Father   . Stroke Paternal Grandmother   . Colon cancer Maternal Grandfather 43  . Diabetes Mother   . Heart disease Paternal Uncle     x 2  . Colon cancer Cousin 48  . Esophageal cancer Paternal Uncle   . Arthritis Father   . Hypertension Mother   . Stroke Maternal Grandmother   . Diabetes Father   . Heart disease  Paternal Grandmother   . Prostate cancer Paternal Uncle     x 4  . Prostate cancer Cousin     x 2   History  Substance Use Topics  . Smoking status: Never Smoker   . Smokeless tobacco: Never Used  . Alcohol Use: No      Objective:   Physical Exam BP 134/92  Pulse 114  Ht 6\' 2"  (1.88 m)  Wt 234 lb 8 oz (106.369 kg)  BMI 30.11 kg/m2 Constitutional: Well-developed and well-nourished. No distress. HEENT: Normocephalic and atraumatic. Oropharynx is clear and moist. No oropharyngeal exudate. Conjunctivae are normal. Pupils are equal round and reactive to light. No scleral icterus. Neck: Neck supple. Trachea midline. Cardiovascular: Normal rate, regular rhythm and intact distal pulses. No M/R/G Pulmonary/chest: Effort normal and breath sounds normal. No wheezing, rales or rhonchi. Abdominal: Soft, nontender, mildly distended without significant tympany. Bowel sounds active throughout. There are no masses palpable. No hepatosplenomegaly. Extremities: no clubbing, cyanosis, or edema Lymphadenopathy: No cervical adenopathy noted. Neurological: Alert and oriented to person place and time. Skin: Skin is warm and dry. No rashes noted. Psychiatric: Normal mood and affect. Behavior is normal.  CMP     Component Value Date/Time   NA 142 11/20/2011 1202   K 4.4 11/20/2011 1202   CL 103 11/20/2011 1202   CO2 27 11/20/2011 1202   GLUCOSE 111* 11/20/2011 1202   BUN 10 11/20/2011 1202   CREATININE 1.0 11/20/2011 1202   CALCIUM 10.1 11/20/2011 1202   PROT 8.6* 11/20/2011 1202   ALBUMIN 5.2 11/20/2011 1202   AST 24 11/20/2011 1202   ALT 50 11/20/2011 1202   ALKPHOS 80 11/20/2011 1202   BILITOT 0.0* 11/20/2011 1202   GFRNONAA >90 09/30/2011 2146   GFRAA >90 09/30/2011 2146   CBC    Component Value Date/Time   WBC 6.3 11/20/2011 1202   RBC 5.23 11/20/2011 1202   HGB 14.6 11/20/2011 1202   HCT 44.9 11/20/2011 1202   PLT 187.0 11/20/2011 1202   MCV 85.9 11/20/2011 1202   MCH 27.4 09/30/2011 2146   MCHC  32.6 11/20/2011 1202   RDW 14.4 11/20/2011 1202   LYMPHSABS 1.6 11/20/2011 1202   MONOABS 0.4 11/20/2011 1202   EOSABS 0.2 11/20/2011 1202   BASOSABS 0.0 11/20/2011 1202      Assessment & Plan:  51 yo male with PMH of hypertension, hyperlipidemia, depression, DM-2, and GERD with eosinophilic esophagitis who seen for followup  1. Eosinophilic esophagitis -- he is having a flare of his disease at present, and this is despite daily PPI therapy. His AcipHex is controlling his GERD, but I recommended  a treatment course of fluticasone 220 mcg 2 sprays swallowed twice a day x 6 weeks. We discussed how eosinophilic esophagitis does seem to relapse and remit, and he will likely need topical steroid therapy every now and then. I like for him to continue on his AcipHex 20 mg daily. If he has not had significant improvement after one week of swallowed fluticasone, have asked that he notify us immediately. He voices understanding.  2. CRC screening -- patient is high risk based on family history, due repeat colonoscopy 2015  3. Constipation -- well-controlled MiraLAX 17 g daily. He'll continue with this therapy at this dose for now.   Return when necessary

## 2011-12-23 NOTE — Patient Instructions (Signed)
We have sent  medications to your pharmacy for you to pick up at your convenience. Take the flovent twice daily for 6 weeks  Continue taking miralx and Aciphex  Follow up as needed.

## 2011-12-24 ENCOUNTER — Telehealth: Payer: Self-pay | Admitting: *Deleted

## 2011-12-24 ENCOUNTER — Ambulatory Visit (INDEPENDENT_AMBULATORY_CARE_PROVIDER_SITE_OTHER): Payer: Self-pay | Admitting: Endocrinology

## 2011-12-24 ENCOUNTER — Encounter: Payer: Self-pay | Admitting: Endocrinology

## 2011-12-24 VITALS — BP 118/78 | HR 113 | Temp 98.5°F | Ht 74.0 in | Wt 236.0 lb

## 2011-12-24 DIAGNOSIS — IMO0001 Reserved for inherently not codable concepts without codable children: Secondary | ICD-10-CM

## 2011-12-24 MED ORDER — GABAPENTIN 600 MG PO TABS: 600.0000 mg | ORAL_TABLET | Freq: Three times a day (TID) | ORAL | Status: DC

## 2011-12-24 MED ORDER — PIOGLITAZONE HCL 45 MG PO TABS: ORAL_TABLET | ORAL | Status: DC

## 2011-12-24 NOTE — Telephone Encounter (Signed)
1 tab tid

## 2011-12-24 NOTE — Telephone Encounter (Signed)
Called to inform pharmacy, line busy.

## 2011-12-24 NOTE — Patient Instructions (Addendum)
Please continue the same janumet.  Here are some samples Add actos 1/2 of 45 mg daily.     Please come back for a follow-up appointment in 3 months.

## 2011-12-24 NOTE — Telephone Encounter (Signed)
[facility] called requesting clarification on Gabapentin Rx. Is pt to take 1 tablet tid or 1 tablet in the AM, 1 tablet in PM and 2 tablets QHS. Please advise.

## 2011-12-24 NOTE — Telephone Encounter (Signed)
[facility] pharmacist informed of correct signature.

## 2011-12-24 NOTE — Progress Notes (Signed)
Subjective:    Patient ID: Dennis Conley, male    DOB: December 10, 1960, 51 y.o.   MRN: [id number]  HPI pt was dx'ed with DM in late 2012.  He was tried on oral agents, but he did not achieve good control on these.  Insulin was added, but he was seen at ER 2 with severe hypoglycemia, so insulin was stopped.  He now takes only janumet-XR.  no cbg record, but states cbg's are in the mid-100's. Past Medical History  Diagnosis Date  . ERECTILE DYSFUNCTION 10/29/2010  . Chronic headaches 10/29/2010  . HYPERLIPIDEMIA 10/29/2010  . OSTEOARTHRITIS 10/29/2010  . DEPRESSION 10/29/2010    Dr Dub Mikes  . IBS (irritable bowel syndrome)   . GERD (gastroesophageal reflux disease)   . Anxiety   . Hypertension   . Eosinophilic esophagitis   . Diabetes mellitus   . Hypertension     Past Surgical History  Procedure Date  . Cervical fusion   . Cervical laminectomy 2012    Dr. Yevette Edwards  . Lumbar laminectomy 2007  . Lumbar fusion 2006  . Nasal sinus surgery   . Spinal cord stimulator implant 2011    History   Social History  . Marital Status: Married    Spouse Name: N/A    Number of Children: 5  . Years of Education: N/A   Occupational History  . Disabled    Social History Main Topics  . Smoking status: Never Smoker   . Smokeless tobacco: Never Used  . Alcohol Use: No  . Drug Use: No  . Sexually Active: Yes   Other Topics Concern  . Not on file   Social History Narrative   3 caffeine drinks daily     Current Outpatient Prescriptions on File Prior to Visit  Medication Sig Dispense Refill  . amitriptyline (ELAVIL) 75 MG tablet Take 1 tablet (75 mg total) by mouth at bedtime.  30 tablet  1  . clonazePAM (KLONOPIN) 1 MG tablet Take 1 tablet (1 mg total) by mouth 2 (two) times daily as needed for anxiety.  60 tablet  1  . dicyclomine (BENTYL) 10 MG capsule Take 1 capsule (10 mg total) by mouth as needed.  60 capsule  2  . DULoxetine (CYMBALTA) 60 MG capsule Take 60 mg by mouth daily.       .  fluticasone (FLOVENT HFA) 220 MCG/ACT inhaler 2 puffs swallowed  Twice daily.  1 Inhaler  12  . olmesartan (BENICAR) 20 MG tablet Take 1 tablet (20 mg total) by mouth daily.  56 tablet  0  . omega-3 acid ethyl esters (LOVAZA) 1 G capsule Take 2 capsules (2 g total) by mouth 2 (two) times daily.  120 capsule  11  . polyethylene glycol powder (GLYCOLAX/MIRALAX) powder Take 17 g by mouth daily.  255 g  3  . Probiotic Product (ALIGN) 4 MG CAPS Take 1 capsule by mouth daily.  28 capsule  0  . RABEprazole (ACIPHEX) 20 MG tablet Take 1 tablet (20 mg total) by mouth daily.  30 tablet  11  . rosuvastatin (CRESTOR) 10 MG tablet Take 1 tablet (10 mg total) by mouth at bedtime.  84 tablet  0  . sitaGLIPtan-metformin (JANUMET) 50-1000 MG per tablet Take 1 tablet by mouth 2 (two) times daily with a meal.      . DISCONTD: gabapentin (NEURONTIN) 600 MG tablet Take 1 tablet (600 mg total) by mouth 3 (three) times daily. 1 in am, 1 in pm, 2 at  HS  120 tablet  0  . pioglitazone (ACTOS) 45 MG tablet 1/2 pill daily  90 tablet  3    Allergies  Allergen Reactions  . Bystolic (Nebivolol Hcl)     Makes him sweat too much    Family History  Problem Relation Age of Onset  . Alcohol abuse Father   . Arthritis Mother   . Hypertension Father   . Hyperlipidemia Father   . Prostate cancer Father   . Stroke Paternal Grandmother   . Colon cancer Maternal Grandfather 16  . Diabetes Mother   . Heart disease Paternal Uncle     x 2  . Colon cancer Cousin 48  . Esophageal cancer Paternal Uncle   . Arthritis Father   . Hypertension Mother   . Stroke Maternal Grandmother   . Diabetes Father   . Heart disease Paternal Grandmother   . Prostate cancer Paternal Uncle     x 4  . Prostate cancer Cousin     x 2    BP 118/78  Pulse 113  Temp(Src) 98.5 F (36.9 C) (Oral)  Ht 6\' 2"  (1.88 m)  Wt 236 lb (107.049 kg)  BMI 30.30 kg/m2  SpO2 97%  Review of Systems denies hypoglycemia.    Objective:   Physical  Exam VITAL SIGNS:  See vs page GENERAL: no distress PSYCH: Alert and oriented x 3.  Does not appear anxious nor depressed.  Lab Results  Component Value Date   HGBA1C 7.2* 11/20/2011      Assessment & Plan:  Type 2 DM.  Needs increased rx, if it can be done with a regimen that avoids or minimizes hypoglycemia.

## 2012-01-07 ENCOUNTER — Telehealth (HOSPITAL_COMMUNITY): Payer: Self-pay | Admitting: *Deleted

## 2012-01-07 MED ORDER — DULOXETINE HCL 60 MG PO CPEP: 60.0000 mg | ORAL_CAPSULE | Freq: Every day | ORAL | Status: DC

## 2012-01-07 NOTE — Telephone Encounter (Signed)
Message copied by Tonny Bollman on Tue Jan 07, 2012 11:28 AM ------      Message from: Kathryne Sharper T      Created: Tue Jan 07, 2012  8:13 AM      Regarding: RE: Cymbalta Samples      Contact: [phone]       Sandi, see attached request for samples.      ----- Message -----         From: Newt Lukes         Sent: 01/03/2012   4:20 PM           To: Cleotis Nipper, MD      Subject: Cymbalta Samples                                         Patient Called and Requested Samples of Cymbalta.            APB

## 2012-01-08 ENCOUNTER — Telehealth (HOSPITAL_COMMUNITY): Payer: Self-pay

## 2012-01-08 ENCOUNTER — Encounter: Payer: Self-pay | Admitting: Internal Medicine

## 2012-01-08 ENCOUNTER — Ambulatory Visit: Payer: Self-pay | Admitting: *Deleted

## 2012-01-08 NOTE — Telephone Encounter (Signed)
3:21pm 01/08/12 CALLED PT IN REFERENCE TO MEDICATION (CYMBALTA).[name]

## 2012-01-09 ENCOUNTER — Telehealth (HOSPITAL_COMMUNITY): Payer: Self-pay

## 2012-01-09 NOTE — Telephone Encounter (Signed)
8:15am 01/09/12 pt came and pick-up his medication samples of Cymbalta 60mg .[name]

## 2012-01-10 ENCOUNTER — Encounter: Payer: Self-pay | Admitting: Internal Medicine

## 2012-01-10 ENCOUNTER — Ambulatory Visit (INDEPENDENT_AMBULATORY_CARE_PROVIDER_SITE_OTHER): Payer: Self-pay | Admitting: Internal Medicine

## 2012-01-10 VITALS — BP 134/80 | HR 124 | Ht 74.0 in | Wt 237.0 lb

## 2012-01-10 DIAGNOSIS — M549 Dorsalgia, unspecified: Secondary | ICD-10-CM

## 2012-01-10 DIAGNOSIS — R109 Unspecified abdominal pain: Secondary | ICD-10-CM

## 2012-01-10 DIAGNOSIS — K200 Eosinophilic esophagitis: Secondary | ICD-10-CM

## 2012-01-10 MED ORDER — OXYCODONE-ACETAMINOPHEN 5-325 MG PO TABS: 1.0000 | ORAL_TABLET | Freq: Four times a day (QID) | ORAL | Status: DC | PRN

## 2012-01-10 MED ORDER — OXYCODONE-ACETAMINOPHEN 5-325 MG PO TABS: 1.0000 | ORAL_TABLET | Freq: Four times a day (QID) | ORAL | Status: AC | PRN

## 2012-01-10 NOTE — Patient Instructions (Addendum)
You have been given samples of Pulmicort (Budesonide inhalation suspension)  Take 1 box twice daily as directed  If this does not work for you, please fill the prescription for Fluticisone  We have sent medications to your pharmacy for you to pick up at your convenience.

## 2012-01-10 NOTE — Progress Notes (Signed)
Subjective:    Patient ID: Dennis Conley, male    DOB: Sep 15, 1960, 51 y.o.   MRN: [id number]  HPI Mr. Bolls is a 51 yo male with PMH of hypertension, hyperlipidemia, depression, DM-2, and GERD with eosinophilic esophagitis who seen for followup and for evaluation of bilateral flank pain. He reports bilateral flank pain which started about 2 weeks ago. It is the worse with sitting and with lying down. There is no change with movement. He's been trying ibuprofen 600 mg tablets one to 2 per day without much help. He has not had nausea or vomiting. No fever. He's had no anterior abdominal pain. No urinary symptoms including no dysuria. Appetite has been fine. He did have an episode of loose stools today, but he does not relate this to his flank pain and states this is typical for his IBS. He does not recall any trauma or inciting event prior to the pain. He's had back pain in the past with multiple surgeries, but doesn't recall a pain such as this one.  He was unable to afford the fluticasone therapy, and therefore he has not started therapy for his eosinophilic esophagitis. He continues to have dysphagia worse with solids  Review of Systems As per history of present illness, otherwise negative  Current Medications, Allergies, Past Medical History, Past Surgical History, Family History and Social History were reviewed in [employer] record.      Objective:   Physical Exam BP 134/80  Pulse 124  Ht 6\' 2"  (1.88 m)  Wt 237 lb (107.502 kg)  BMI 30.43 kg/m2 Constitutional: Well-developed and well-nourished. No distress. HEENT: Normocephalic and atraumatic. Oropharynx is clear and moist. No oropharyngeal exudate. Conjunctivae are normal. Pupils are equal round and reactive to light. No scleral icterus. Neck: Neck supple. Trachea midline. Cardiovascular: Normal rate, regular rhythm and intact distal pulses. No M/R/G Pulmonary/chest: Effort normal and breath sounds normal. No  wheezing, rales or rhonchi. Abdominal: Soft, nontender, mildly distended without rebound or guarding. Bowel sounds active throughout. There are no masses palpable. No hepatosplenomegaly. Extremities: no clubbing, cyanosis, or edema Back: Vertical scars over the thoracic lumbar and cervical spine which are well-healed, no point tenderness no CVA tenderness Lymphadenopathy: No cervical adenopathy noted. Neurological: Alert and oriented to person place and time. Skin: Skin is warm and dry. No rashes noted. Psychiatric: Normal mood and affect. Behavior is normal.     Assessment & Plan:  Mr. Conover is a 51 yo male with PMH of hypertension, hyperlipidemia, depression, DM-2, and GERD with eosinophilic esophagitis who seen for followup and for evaluation of bilateral flank pain and back pain  1. Flank/back pain -- at present I do not think the pain that he is describing is related to his GI tract. It is more likely musculoskeletal pain perhaps coming from the degenerative spine issue. I recommended rest and will give him a prescription for Percocet to be used as directed and as needed for pain. If he worsens or does not improve after 7 days, then we will initiate further workup with imaging. He voices understanding, and is happy with this plan  2. Eosinophilic esophagitis -- he is on PPI therapy, and I recommended fluticasone 220 mcg 2 sprays twice a day swallowed, however he is having a hard time affording this medication. I was able to obtain budesonide nebulizer 1 mg per 2 mL from pulmonary clinic today. I've advised that he try this 1 mg swallowed twice a day. Hopefully this will be helpful,  if not I have advised that he spend the money and fill the fluticasone prescription. He is happy with this plan and will call us if there is no improvement.

## 2012-01-20 ENCOUNTER — Ambulatory Visit (HOSPITAL_COMMUNITY): Payer: Self-pay | Admitting: Psychiatry

## 2012-01-29 ENCOUNTER — Ambulatory Visit (INDEPENDENT_AMBULATORY_CARE_PROVIDER_SITE_OTHER): Payer: Self-pay | Admitting: Psychiatry

## 2012-01-29 ENCOUNTER — Encounter (HOSPITAL_COMMUNITY): Payer: Self-pay | Admitting: Psychiatry

## 2012-01-29 DIAGNOSIS — F339 Major depressive disorder, recurrent, unspecified: Secondary | ICD-10-CM

## 2012-01-29 DIAGNOSIS — F329 Major depressive disorder, single episode, unspecified: Secondary | ICD-10-CM

## 2012-01-29 MED ORDER — AMITRIPTYLINE HCL 75 MG PO TABS: 75.0000 mg | ORAL_TABLET | Freq: Every day | ORAL | Status: DC
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
## 2012-02-06 ENCOUNTER — Ambulatory Visit: Payer: Self-pay | Admitting: *Deleted

## 2012-03-23 ENCOUNTER — Ambulatory Visit: Payer: Self-pay | Admitting: *Deleted

## 2012-03-27 ENCOUNTER — Telehealth: Payer: Self-pay | Admitting: Internal Medicine

## 2012-03-27 NOTE — Telephone Encounter (Signed)
Left message for pt to call back  °

## 2012-03-30 ENCOUNTER — Ambulatory Visit (INDEPENDENT_AMBULATORY_CARE_PROVIDER_SITE_OTHER): Payer: Self-pay | Admitting: Psychiatry

## 2012-03-30 ENCOUNTER — Other Ambulatory Visit (INDEPENDENT_AMBULATORY_CARE_PROVIDER_SITE_OTHER): Payer: Self-pay

## 2012-03-30 ENCOUNTER — Encounter (HOSPITAL_COMMUNITY): Payer: Self-pay | Admitting: Psychiatry

## 2012-03-30 ENCOUNTER — Encounter: Payer: Self-pay | Admitting: Endocrinology

## 2012-03-30 ENCOUNTER — Ambulatory Visit: Payer: Self-pay | Admitting: Endocrinology

## 2012-03-30 ENCOUNTER — Ambulatory Visit (INDEPENDENT_AMBULATORY_CARE_PROVIDER_SITE_OTHER): Payer: Self-pay | Admitting: Endocrinology

## 2012-03-30 VITALS — BP 130/98 | HR 116 | Temp 98.6°F | Ht 74.0 in | Wt 236.0 lb

## 2012-03-30 VITALS — BP 175/109 | HR 120 | Wt 236.6 lb

## 2012-03-30 DIAGNOSIS — F339 Major depressive disorder, recurrent, unspecified: Secondary | ICD-10-CM

## 2012-03-30 DIAGNOSIS — F329 Major depressive disorder, single episode, unspecified: Secondary | ICD-10-CM

## 2012-03-30 LAB — HEMOGLOBIN A1C: Hgb A1c MFr Bld: 7 % — ABNORMAL HIGH (ref 4.6–6.5)

## 2012-03-30 MED ORDER — AMITRIPTYLINE HCL 75 MG PO TABS: 75.0000 mg | ORAL_TABLET | Freq: Every day | ORAL | Status: DC

## 2012-03-30 MED ORDER — LISINOPRIL 10 MG PO TABS: ORAL_TABLET | ORAL | Status: DC

## 2012-03-30 NOTE — Progress Notes (Signed)
Subjective:    Patient ID: Dennis Conley, male    DOB: February 19, 1961, 51 y.o.   MRN: [id number]  HPI pt was dx'ed with DM in late 2012.  He was tried on oral agents, but he did not achieve good control on these.  Insulin was added, but he was seen at ER twice with severe hypoglycemia, so insulin was stopped.  He now takes only janumet-XR.  no cbg record, but states cbg's are in the mid-100's.  no cbg record, but states cbg's are in the mid-100's.   Past Medical History  Diagnosis Date  . ERECTILE DYSFUNCTION 10/29/2010  . Chronic headaches 10/29/2010  . HYPERLIPIDEMIA 10/29/2010  . OSTEOARTHRITIS 10/29/2010  . DEPRESSION 10/29/2010    Dr Dub Mikes  . IBS (irritable bowel syndrome)   . GERD (gastroesophageal reflux disease)   . Anxiety   . Hypertension   . Eosinophilic esophagitis   . Diabetes mellitus   . Hypertension     Past Surgical History  Procedure Date  . Cervical fusion   . Cervical laminectomy 2012    Dr. Yevette Edwards  . Lumbar laminectomy 2007  . Lumbar fusion 2006  . Nasal sinus surgery   . Spinal cord stimulator implant 2011    History   Social History  . Marital Status: Married    Spouse Name: N/A    Number of Children: 5  . Years of Education: N/A   Occupational History  . Disabled    Social History Main Topics  . Smoking status: Never Smoker   . Smokeless tobacco: Never Used  . Alcohol Use: No  . Drug Use: No  . Sexually Active: Yes   Other Topics Concern  . Not on file   Social History Narrative   3 caffeine drinks daily     Current Outpatient Prescriptions on File Prior to Visit  Medication Sig Dispense Refill  . amitriptyline (ELAVIL) 75 MG tablet Take 1 tablet (75 mg total) by mouth at bedtime.  30 tablet  1  . clonazePAM (KLONOPIN) 1 MG tablet Take 1 tablet (1 mg total) by mouth 2 (two) times daily as needed for anxiety.  60 tablet  1  . dicyclomine (BENTYL) 10 MG capsule Take 1 capsule (10 mg total) by mouth as needed.  60 capsule  2  . DULoxetine  (CYMBALTA) 60 MG capsule Take 1 capsule (60 mg total) by mouth daily.  28 capsule  0  . fluticasone (FLOVENT HFA) 220 MCG/ACT inhaler 2 puffs swallowed  Twice daily.  1 Inhaler  12  . gabapentin (NEURONTIN) 600 MG tablet Take 1 tablet (600 mg total) by mouth 3 (three) times daily.  90 tablet  11  . olmesartan (BENICAR) 20 MG tablet Take 1 tablet (20 mg total) by mouth daily.  56 tablet  0  . omega-3 acid ethyl esters (LOVAZA) 1 G capsule Take 2 capsules (2 g total) by mouth 2 (two) times daily.  120 capsule  11  . pioglitazone (ACTOS) 45 MG tablet 1/2 pill daily  90 tablet  3  . RABEprazole (ACIPHEX) 20 MG tablet Take 1 tablet (20 mg total) by mouth daily.  30 tablet  11  . sitaGLIPtan-metformin (JANUMET) 50-1000 MG per tablet Take 1 tablet by mouth 2 (two) times daily with a meal.      . lisinopril (ZESTRIL) 10 MG tablet 1/2 tab daily  30 tablet  5  . Probiotic Product (ALIGN) 4 MG CAPS Take 1 capsule by mouth daily.  28 capsule  0  . rosuvastatin (CRESTOR) 10 MG tablet Take 1 tablet (10 mg total) by mouth at bedtime.  84 tablet  0    Allergies  Allergen Reactions  . Bystolic (Nebivolol Hcl)     Makes him sweat too much    Family History  Problem Relation Age of Onset  . Alcohol abuse Father   . Arthritis Mother   . Hypertension Father   . Hyperlipidemia Father   . Prostate cancer Father   . Stroke Paternal Grandmother   . Colon cancer Maternal Grandfather 33  . Diabetes Mother   . Heart disease Paternal Uncle     x 2  . Colon cancer Cousin 48  . Esophageal cancer Paternal Uncle   . Arthritis Father   . Hypertension Mother   . Stroke Maternal Grandmother   . Diabetes Father   . Heart disease Paternal Grandmother   . Prostate cancer Paternal Uncle     x 4  . Prostate cancer Cousin     x 2    BP 130/98  Pulse 116  Temp 98.6 F (37 C) (Oral)  Ht 6\' 2"  (1.88 m)  Wt 236 lb (107.049 kg)  BMI 30.30 kg/m2  SpO2 98%    Review of Systems Denies LOC    Objective:    Physical Exam Pulses: dorsalis pedis intact bilat.   Feet: no deformity.  no ulcer on the feet.  feet are of normal color and temp.  no edema Neuro: sensation is intact to touch on the feet   Lab Results  Component Value Date   HGBA1C 7.0* 03/30/2012      Assessment & Plan:  Dm.  well-controlled
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
## 2012-03-30 NOTE — Patient Instructions (Addendum)
blood tests are being requested for you today.  You will receive a letter with results.   Please come back for a follow-up appointment in 3 months.   i have sent a prescription to your pharmacy, for "lisinopril." call if this gives you a cough.

## 2012-03-31 NOTE — Telephone Encounter (Signed)
lmom for pt to call back

## 2012-04-01 ENCOUNTER — Telehealth: Payer: Self-pay | Admitting: *Deleted

## 2012-04-01 NOTE — Telephone Encounter (Signed)
Called pt to inform of lab results, pt informed via VM and to callback office with any questions/concerns (letter also mailed to pt).  

## 2012-04-03 NOTE — Telephone Encounter (Signed)
Pt never called back.

## 2012-04-20 ENCOUNTER — Encounter: Payer: Self-pay | Admitting: Internal Medicine

## 2012-04-21 ENCOUNTER — Ambulatory Visit: Payer: Self-pay | Admitting: Internal Medicine

## 2012-04-27 ENCOUNTER — Encounter: Payer: Self-pay | Admitting: Internal Medicine

## 2012-04-28 ENCOUNTER — Ambulatory Visit: Payer: Self-pay | Admitting: Internal Medicine

## 2012-04-29 ENCOUNTER — Ambulatory Visit: Payer: Self-pay | Admitting: Endocrinology

## 2012-04-29 DIAGNOSIS — Z0289 Encounter for other administrative examinations: Secondary | ICD-10-CM

## 2012-05-11 ENCOUNTER — Other Ambulatory Visit (HOSPITAL_COMMUNITY): Payer: Self-pay | Admitting: Psychiatry

## 2012-05-11 DIAGNOSIS — F329 Major depressive disorder, single episode, unspecified: Secondary | ICD-10-CM

## 2012-05-12 ENCOUNTER — Ambulatory Visit (INDEPENDENT_AMBULATORY_CARE_PROVIDER_SITE_OTHER): Payer: Self-pay | Admitting: Internal Medicine

## 2012-05-12 ENCOUNTER — Other Ambulatory Visit (INDEPENDENT_AMBULATORY_CARE_PROVIDER_SITE_OTHER): Payer: Self-pay

## 2012-05-12 ENCOUNTER — Encounter: Payer: Self-pay | Admitting: Internal Medicine

## 2012-05-12 VITALS — BP 144/90 | HR 90 | Temp 98.8°F | Resp 16 | Ht 74.0 in | Wt 235.5 lb

## 2012-05-12 DIAGNOSIS — E7800 Pure hypercholesterolemia, unspecified: Secondary | ICD-10-CM

## 2012-05-12 DIAGNOSIS — I10 Essential (primary) hypertension: Secondary | ICD-10-CM

## 2012-05-12 DIAGNOSIS — F528 Other sexual dysfunction not due to a substance or known physiological condition: Secondary | ICD-10-CM

## 2012-05-12 LAB — COMPREHENSIVE METABOLIC PANEL
ALT: 35 U/L (ref 0–53)
AST: 22 U/L (ref 0–37)
Albumin: 4.7 g/dL (ref 3.5–5.2)
CO2: 24 mEq/L (ref 19–32)
Calcium: 9.6 mg/dL (ref 8.4–10.5)
Chloride: 104 mEq/L (ref 96–112)
Potassium: 3.8 mEq/L (ref 3.5–5.1)

## 2012-05-12 LAB — LIPID PANEL: Total CHOL/HDL Ratio: 4

## 2012-05-12 MED ORDER — TADALAFIL 5 MG PO TABS: 5.0000 mg | ORAL_TABLET | Freq: Every day | ORAL | Status: AC | PRN

## 2012-05-12 MED ORDER — OLMESARTAN MEDOXOMIL-HCTZ 40-12.5 MG PO TABS: 1.0000 | ORAL_TABLET | Freq: Every day | ORAL | Status: DC

## 2012-05-12 NOTE — Progress Notes (Signed)
  Subjective:    Patient ID: Dennis Conley, male    DOB: 05/03/1961, 51 y.o.   MRN: [id number]  Hypertension This is a chronic problem. The current episode started more than 1 year ago. The problem is unchanged. The problem is uncontrolled. Associated symptoms include anxiety. Pertinent negatives include no blurred vision, chest pain, headaches, malaise/fatigue, neck pain, orthopnea, palpitations, peripheral edema, PND, shortness of breath or sweats. Past treatments include angiotensin blockers. The current treatment provides moderate improvement. Compliance problems include exercise and diet.       Review of Systems  Constitutional: Negative.  Negative for malaise/fatigue.  HENT: Negative.  Negative for neck pain.   Eyes: Negative.  Negative for blurred vision.  Respiratory: Negative for cough, chest tightness, shortness of breath, wheezing and stridor.   Cardiovascular: Negative for chest pain, palpitations, orthopnea, leg swelling and PND.  Gastrointestinal: Negative for nausea, abdominal pain, diarrhea, constipation and anal bleeding.  Genitourinary: Negative.   Musculoskeletal: Negative.   Skin: Negative for color change, pallor, rash and wound.  Neurological: Negative.  Negative for headaches.  Hematological: Negative for adenopathy. Does not bruise/bleed easily.  Psychiatric/Behavioral: Negative.        Objective:   Physical Exam  Vitals reviewed. Constitutional: He is oriented to person, place, and time. He appears well-developed and well-nourished. No distress.  HENT:  Head: Normocephalic and atraumatic.  Mouth/Throat: Oropharynx is clear and moist. No oropharyngeal exudate.  Eyes: Conjunctivae normal are normal. Right eye exhibits no discharge. Left eye exhibits no discharge. No scleral icterus.  Neck: Normal range of motion. Neck supple. No JVD present. No tracheal deviation present. No thyromegaly present.  Cardiovascular: Normal rate, regular rhythm, normal heart sounds  and intact distal pulses.  Exam reveals no gallop and no friction rub.   No murmur heard. Pulmonary/Chest: Effort normal and breath sounds normal. No stridor. No respiratory distress. He has no wheezes. He has no rales. He exhibits no tenderness.  Abdominal: Soft. Bowel sounds are normal. He exhibits no distension and no mass. There is no tenderness. There is no rebound and no guarding.  Musculoskeletal: Normal range of motion. He exhibits no edema and no tenderness.  Lymphadenopathy:    He has no cervical adenopathy.  Neurological: He is oriented to person, place, and time.  Skin: Skin is warm and dry. No rash noted. He is not diaphoretic. No erythema. No pallor.  Psychiatric: He has a normal mood and affect. His behavior is normal. Judgment and thought content normal.      Lab Results  Component Value Date   WBC 6.3 11/20/2011   HGB 14.6 11/20/2011   HCT 44.9 11/20/2011   PLT 187.0 11/20/2011   GLUCOSE 111* 11/20/2011   CHOL 205* 11/20/2011   TRIG 159.0* 11/20/2011   HDL 44.40 11/20/2011   LDLDIRECT 138.7 11/20/2011   ALT 50 11/20/2011   AST 24 11/20/2011   NA 142 11/20/2011   K 4.4 11/20/2011   CL 103 11/20/2011   CREATININE 1.0 11/20/2011   BUN 10 11/20/2011   CO2 27 11/20/2011   TSH 2.90 11/20/2011   PSA 0.53 11/20/2011   INR 1.06 09/12/2010   HGBA1C 7.0* 03/30/2012   MICROALBUR 18.6* 07/31/2011      Assessment & Plan:

## 2012-05-12 NOTE — Assessment & Plan Note (Signed)
His BP is not well controlled so I have changed his BP med to benicar-hct, today I will check his lytes and renal function

## 2012-05-12 NOTE — Assessment & Plan Note (Signed)
He will try cialis 

## 2012-05-12 NOTE — Patient Instructions (Signed)

## 2012-05-12 NOTE — Assessment & Plan Note (Signed)
He is doing well on crestor and lovaza, I will check his LFT's and FLP today

## 2012-05-18 ENCOUNTER — Telehealth (HOSPITAL_COMMUNITY): Payer: Self-pay | Admitting: *Deleted

## 2012-05-18 NOTE — Telephone Encounter (Signed)
Left VM,heard 05/18/12 @ 1008: Requesting samples of Cymbalta 60 mg

## 2012-05-22 ENCOUNTER — Telehealth (HOSPITAL_COMMUNITY): Payer: Self-pay

## 2012-05-22 NOTE — Telephone Encounter (Signed)
3:16pm 05/22/12 called and left msg that samples of Cymbalta is in and ready for pick-up./sh

## 2012-05-22 NOTE — Telephone Encounter (Signed)
05/22/12 4:01pm patient came and pick-up his medication samples.Dennis Conley

## 2012-06-03 ENCOUNTER — Ambulatory Visit (HOSPITAL_COMMUNITY): Payer: Self-pay | Admitting: Psychiatry

## 2012-06-17 ENCOUNTER — Ambulatory Visit (INDEPENDENT_AMBULATORY_CARE_PROVIDER_SITE_OTHER): Payer: Self-pay | Admitting: Psychiatry

## 2012-06-17 ENCOUNTER — Encounter (HOSPITAL_COMMUNITY): Payer: Self-pay | Admitting: Psychiatry

## 2012-06-17 DIAGNOSIS — F329 Major depressive disorder, single episode, unspecified: Secondary | ICD-10-CM

## 2012-06-17 DIAGNOSIS — F339 Major depressive disorder, recurrent, unspecified: Secondary | ICD-10-CM

## 2012-06-17 MED ORDER — AMITRIPTYLINE HCL 100 MG PO TABS: 100.0000 mg | ORAL_TABLET | Freq: Every day | ORAL | Status: DC

## 2012-06-17 NOTE — Progress Notes (Signed)
Chief complaint Anxiety and followup.      History of presenting illness Patient is 51 year old [place] American male who came for his followup appointment.  He endorse increased anxiety and nervousness in past few weeks.  He continues to have a lot of family stress .  He is compliant with the medication and reported no side effects.  He is sleeping 4-5 hours.  He complained racing thoughts and nervousness.  He is taking Klonopin 1 mg twice a day which is helping somewhat his anxiety.  He's not abusing his anxiety.  He endorse decreased energy decreased concentration and some crying spells.  He denies any active or passive suicidal thoughts.  He is taking Cymbalta amitriptyline and Klonopin.  There has been no changes in his current medication.  He's not drinking or using any illegal substance.  Current psychiatric medication Klonopin 1 mg up to twice a day Cymbalta 60 mg Amitriptyline 75 mg at bedtime  Past psychiatric history Patient has at least 2 intensive outpatient program in the past due to depression and anxiety. He admitted taking Klonopin and Xanax at a very early age prescribed by primary care Dr. in [place]. He denies any history of suicidal attempt but endorse anxiety and depression. He denies any inpatient psychiatric treatment. He denies any paranoia or psychosis. He denies any history of violence.  Medical history Patient has chronic back pain, neck pain due to fusion and back surgery in the past. He has history of hypertension, benign prostate hyper partial, claudication, elevated total protein and diabetes mellitus.  He takes multiple medication for his diabetes.    Alcohol and substance use history Patient denies any history of alcohol or substance use  Psychosocial history Patient lives with his wife and 5 children who are under 14. He has lived in the past in [place] and [place]. He moved to [place] due to her job is on however he is currently disabled. Since August  2011 he is unable to work and currently on disability.  Mental status examination Patient is casually dressed and fairly groomed. He is anxious and maintained fair eye contact. His speech is soft clear and coherent. He described his mood is anxious and depressed and his affect was constricted. His thought process is logical linear and goal-directed. He denies any active or passive suicidal thoughts or homicidal thoughts. There no psychotic symptoms present. His attention and concentration is fair. His insight judgment and pulse control is okay.  Assessment Axis I Maj. depressive disorder recurrent Axis II deferred Axis III see medical history Axis IV mild to moderate Axis V 55-60   Plan I will increase his amitriptyline to 100 mg at bedtime.  She will continue her Cymbalta and Klonopin at present does.  He required Cymbalta samples.  I recommend to call us if he is any question or concern about the medication if he feel worsening of the symptom.  He will see therapist for coping and social skills.  Complete his disability forms as he cannot work at this time due to significant anxiety and depression.  I will see him again in 4 weeks.  Portion of this note is generated with voice recognition software and may contain typographical error.

## 2012-07-08 ENCOUNTER — Telehealth: Payer: Self-pay | Admitting: Internal Medicine

## 2012-07-08 NOTE — Telephone Encounter (Signed)
lmom for pt to call back

## 2012-07-08 NOTE — Telephone Encounter (Signed)
Please call and schedule this patient for an appointment at the appropriate location [facility] office or Endoscopy center. Thank you MyChart Care team  ----- Message ----- From: Kevin Fenton Sent: 06/23/2012 7:08 AM  To: Mychart Admin Subject: Appointment Request Appointment Request  From: Kevin Fenton With Provider: Beverley Fiedler, MD [[facility]] Preferred Date Range: From 06/25/2012 To 06/30/2012 Preferred Times: Mon Morning, Tues Morning, Thur Morning, Fri Morning Reason for visit: New Problem Visit Comments: Cramping every morning with bowel movement and wipping blood.

## 2012-07-13 ENCOUNTER — Ambulatory Visit (INDEPENDENT_AMBULATORY_CARE_PROVIDER_SITE_OTHER): Payer: Self-pay | Admitting: Internal Medicine

## 2012-07-13 ENCOUNTER — Other Ambulatory Visit (INDEPENDENT_AMBULATORY_CARE_PROVIDER_SITE_OTHER): Payer: Self-pay

## 2012-07-13 ENCOUNTER — Encounter: Payer: Self-pay | Admitting: Internal Medicine

## 2012-07-13 VITALS — BP 108/82 | HR 120 | Temp 98.3°F | Resp 20 | Wt 230.2 lb

## 2012-07-13 DIAGNOSIS — R778 Other specified abnormalities of plasma proteins: Secondary | ICD-10-CM

## 2012-07-13 DIAGNOSIS — R799 Abnormal finding of blood chemistry, unspecified: Secondary | ICD-10-CM

## 2012-07-13 DIAGNOSIS — I10 Essential (primary) hypertension: Secondary | ICD-10-CM

## 2012-07-13 DIAGNOSIS — R000 Tachycardia, unspecified: Secondary | ICD-10-CM

## 2012-07-13 DIAGNOSIS — IMO0001 Reserved for inherently not codable concepts without codable children: Secondary | ICD-10-CM

## 2012-07-13 LAB — BASIC METABOLIC PANEL
CO2: 23 mEq/L (ref 19–32)
Calcium: 9.1 mg/dL (ref 8.4–10.5)
Glucose, Bld: 113 mg/dL — ABNORMAL HIGH (ref 70–99)
Sodium: 139 mEq/L (ref 135–145)

## 2012-07-13 LAB — CBC WITH DIFFERENTIAL/PLATELET
Basophils Absolute: 0 10*3/uL (ref 0.0–0.1)
Eosinophils Absolute: 0.1 10*3/uL (ref 0.0–0.7)
Hemoglobin: 11.8 g/dL — ABNORMAL LOW (ref 13.0–17.0)
Lymphocytes Relative: 20.1 % (ref 12.0–46.0)
Lymphs Abs: 1.3 10*3/uL (ref 0.7–4.0)
MCHC: 32.9 g/dL (ref 30.0–36.0)
Monocytes Absolute: 0.3 10*3/uL (ref 0.1–1.0)
Neutro Abs: 4.8 10*3/uL (ref 1.4–7.7)
RDW: 15.2 % — ABNORMAL HIGH (ref 11.5–14.6)

## 2012-07-13 LAB — HEMOGLOBIN A1C: Hgb A1c MFr Bld: 7.3 % — ABNORMAL HIGH (ref 4.6–6.5)

## 2012-07-13 LAB — TSH: TSH: 1.91 u[IU]/mL (ref 0.35–5.50)

## 2012-07-13 NOTE — Assessment & Plan Note (Signed)
His BP is well controlled, I will check his lytes and renal function 

## 2012-07-13 NOTE — Assessment & Plan Note (Signed)
He has a mild tachycardia, EKG is otherwise normal, I will check his labs today including a urine drug screen to look for secondary causes

## 2012-07-13 NOTE — Assessment & Plan Note (Signed)
I will check his a1c today and will address if needed 

## 2012-07-13 NOTE — Patient Instructions (Signed)

## 2012-07-13 NOTE — Progress Notes (Signed)
Subjective:    Patient ID: Dennis Conley, male    DOB: 01-29-1961, 51 y.o.   MRN: [id number]  Diabetes He presents for his follow-up diabetic visit. He has type 2 diabetes mellitus. There are no hypoglycemic associated symptoms. Pertinent negatives for hypoglycemia include no dizziness, headaches, pallor, seizures, speech difficulty or tremors. Associated symptoms include polydipsia, polyphagia, polyuria and weight loss. Pertinent negatives for diabetes include no blurred vision, no chest pain, no fatigue, no foot paresthesias, no foot ulcerations, no visual change and no weakness. There are no hypoglycemic complications. Symptoms are worsening. Symptoms have been present for 3 weeks. There are no diabetic complications. Current diabetic treatment includes oral agent (triple therapy). He is compliant with treatment most of the time. His weight is decreasing steadily. He is following a generally healthy diet. Meal planning includes avoidance of concentrated sweets. His breakfast blood glucose range is generally 110-130 mg/dl. His lunch blood glucose range is generally 180-200 mg/dl. His dinner blood glucose range is generally 180-200 mg/dl. His highest blood glucose is 180-200 mg/dl. His overall blood glucose range is 130-140 mg/dl. An ACE inhibitor/angiotensin II receptor blocker is being taken. He does not see a podiatrist.Eye exam is current.      Review of Systems  Constitutional: Positive for weight loss and unexpected weight change (weight loss). Negative for fever, chills, diaphoresis, activity change, appetite change and fatigue.  HENT: Negative.   Eyes: Negative.  Negative for blurred vision.  Respiratory: Negative for apnea, cough, choking, chest tightness, shortness of breath, wheezing and stridor.   Cardiovascular: Negative for chest pain, palpitations and leg swelling.  Gastrointestinal: Negative for nausea, vomiting, abdominal pain, diarrhea, constipation and blood in stool.    Genitourinary: Positive for polyuria and frequency. Negative for dysuria, urgency, hematuria, flank pain, decreased urine volume, discharge, penile swelling, scrotal swelling, enuresis, difficulty urinating, genital sores, penile pain and testicular pain.  Musculoskeletal: Negative for myalgias, back pain, joint swelling, arthralgias and gait problem.  Skin: Negative for color change, pallor, rash and wound.  Neurological: Negative for dizziness, tremors, seizures, syncope, facial asymmetry, speech difficulty, weakness, light-headedness, numbness and headaches.  Hematological: Positive for polydipsia and polyphagia. Negative for adenopathy. Does not bruise/bleed easily.  Psychiatric/Behavioral: Negative.        Objective:   Physical Exam  Vitals reviewed. Constitutional: He is oriented to person, place, and time. He appears well-developed and well-nourished. No distress.  HENT:  Head: Normocephalic and atraumatic.  Mouth/Throat: Oropharynx is clear and moist. No oropharyngeal exudate.  Eyes: Conjunctivae normal are normal. Right eye exhibits no discharge. Left eye exhibits no discharge. No scleral icterus.  Neck: Normal range of motion. Neck supple. No JVD present. No tracheal deviation present. No thyromegaly present.  Cardiovascular: Normal rate, regular rhythm, normal heart sounds and intact distal pulses.  Exam reveals no gallop and no friction rub.   No murmur heard. Pulmonary/Chest: Effort normal and breath sounds normal. No stridor. No respiratory distress. He has no wheezes. He has no rales. He exhibits no tenderness.  Abdominal: Soft. Bowel sounds are normal. He exhibits no distension and no mass. There is no tenderness. There is no rebound and no guarding.  Musculoskeletal: Normal range of motion. He exhibits no edema and no tenderness.  Lymphadenopathy:    He has no cervical adenopathy.  Neurological: He is oriented to person, place, and time.  Skin: Skin is warm and dry. No  rash noted. He is not diaphoretic. No erythema. No pallor.  Psychiatric: He has a normal mood  and affect. His behavior is normal. Judgment and thought content normal.     Lab Results  Component Value Date   WBC 6.3 11/20/2011   HGB 14.6 11/20/2011   HCT 44.9 11/20/2011   PLT 187.0 11/20/2011   GLUCOSE 142* 05/12/2012   CHOL 157 05/12/2012   TRIG 144.0 05/12/2012   HDL 39.60 05/12/2012   LDLDIRECT 138.7 11/20/2011   LDLCALC 89 05/12/2012   ALT 35 05/12/2012   AST 22 05/12/2012   NA 137 05/12/2012   K 3.8 05/12/2012   CL 104 05/12/2012   CREATININE 1.1 05/12/2012   BUN 9 05/12/2012   CO2 24 05/12/2012   TSH 2.90 11/20/2011   PSA 0.53 11/20/2011   INR 1.06 09/12/2010   HGBA1C 7.0* 03/30/2012   MICROALBUR 18.6* 07/31/2011       Assessment & Plan:

## 2012-07-14 NOTE — Telephone Encounter (Signed)
Pt never called back.

## 2012-07-17 ENCOUNTER — Ambulatory Visit (HOSPITAL_COMMUNITY): Payer: Self-pay | Admitting: Psychiatry

## 2012-07-20 ENCOUNTER — Ambulatory Visit: Payer: Self-pay | Admitting: Endocrinology

## 2012-07-22 ENCOUNTER — Ambulatory Visit (INDEPENDENT_AMBULATORY_CARE_PROVIDER_SITE_OTHER): Payer: Self-pay | Admitting: Psychiatry

## 2012-07-22 ENCOUNTER — Encounter (HOSPITAL_COMMUNITY): Payer: Self-pay | Admitting: Psychiatry

## 2012-07-22 VITALS — BP 109/92 | HR 113 | Wt 233.6 lb

## 2012-07-22 DIAGNOSIS — F329 Major depressive disorder, single episode, unspecified: Secondary | ICD-10-CM

## 2012-07-22 DIAGNOSIS — F339 Major depressive disorder, recurrent, unspecified: Secondary | ICD-10-CM

## 2012-07-22 MED ORDER — AMITRIPTYLINE HCL 100 MG PO TABS
100.0000 mg | ORAL_TABLET | Freq: Every day | ORAL | Status: DC
Start: 2012-07-22 — End: 2012-09-02

## 2012-07-22 MED ORDER — ARIPIPRAZOLE 2 MG PO TABS: 2.0000 mg | ORAL_TABLET | Freq: Every day | ORAL | Status: DC

## 2012-07-22 NOTE — Progress Notes (Signed)
Patient ID: Dennis Conley, male   DOB: Oct 20, 1960, 51 y.o.   MRN: [id number] Chief complaint I still feel that he depressed and anxious.  My sleep is improved with increase amitriptyline.      History of presenting illness Patient is 51 year old [place] American male who came for his followup appointment.  On his last visit we increased his amitriptyline for his insomnia.  He is sleeping better but he continued to feel depressed isolated irritable and anxious.  His uncle died just before Thanksgiving in [place].  He did visit him in summer but could not attend the funeral.  He endorse family stress .  He is sleeping better but he continued to endorse racing thoughts or decreased energy and some crying spells.  He is tolerating the medication without any side effects.  He's not drinking or using any illegal substance.  He is also taking Cymbalta and Klonopin as prescribed.  He like to see therapist once he received insurance in January.  He seen recently his primary care physician for annual checkup .  He has a blood drawn which shows his hemoglobin A1c is slightly increased .  His blood pressure medication is changed.  He has gained weight from his last visit.  He does not do exercise are watch his calorie intake.  Current psychiatric medication Klonopin 1 mg up to twice a day Cymbalta 60 mg Amitriptyline 100 mg at bedtime  Past psychiatric history Patient has at least 2 intensive outpatient program in the past due to depression and anxiety. He admitted taking Klonopin and Xanax at a very early age prescribed by primary care Dr. in [place]. He denies any history of suicidal attempt but endorse anxiety and depression. He denies any inpatient psychiatric treatment. He denies any paranoia or psychosis. He denies any history of violence.  Medical history Patient has chronic back pain, neck pain due to fusion and back surgery in the past. He has history of hypertension, benign prostate hyper partial,  claudication, elevated total protein and diabetes mellitus.  He takes multiple medication for his diabetes.    Alcohol and substance use history Patient denies any history of alcohol or substance use  Psychosocial history Patient lives with his wife and 5 children who are under 14. He has lived in the past in [place] and [place]. He moved to [place] due to her job is on however he is currently disabled. Since August 2011 he is unable to work and currently on disability.  Review of Systems  Constitutional: Positive for malaise/fatigue.  Cardiovascular: Negative for chest pain and palpitations.  Musculoskeletal: Positive for back pain and joint pain.  Neurological: Positive for weakness. Negative for seizures and loss of consciousness.  Psychiatric/Behavioral: Positive for depression. Negative for suicidal ideas, hallucinations and substance abuse. The patient is nervous/anxious.    Mental status examination Patient is casually dressed and fairly groomed. He is anxious and maintained fair eye contact. His speech is soft clear and coherent. He described his mood is anxious and depressed and his affect was constricted.  There were no flight of ideas or loose association.  His thought process is logical linear and goal-directed.  He denies any auditory or visual hallucination.  He denies any active or passive suicidal thoughts or homicidal thoughts. There no psychotic symptoms present. His attention and concentration is fair. His insight judgment and pulse control is okay.  Assessment Axis I Maj. depressive disorder recurrent Axis II deferred Axis III see medical history Axis IV  mild to moderate Axis V 55-60   Plan I review his symptoms, recent blood test reports, last progress note and review his medication.  I recommend to try Abilify 2 mg to help his depressive symptoms.  Patient recall taking Abilify 5 mg from his previous psychiatrist but it was stopped when he switched Dr.  I will  continue amitriptyline 100 mg and Cymbalta along with Klonopin.  He is getting samples of Cymbalta however it will be generic available in January.  I also recommend to see therapist but he is waiting for his insurance in January.  Reassurance given.  Counseling given for his weight reduction, diet and regular exercise.  I recommend to call us if he is any question or concern about the medication if he feel worsening of the symptom.  Samples of 2 mg Abilify given for 6 weeks.  Also prescription of Abilify 2 mg given at pharmacy.  I will see him again in 6 weeks.  Time spent 30 minutes.  Portion of this note is generated with voice recognition software and may contain typographical error.

## 2012-08-10 ENCOUNTER — Ambulatory Visit (INDEPENDENT_AMBULATORY_CARE_PROVIDER_SITE_OTHER): Payer: Self-pay | Admitting: Internal Medicine

## 2012-08-10 ENCOUNTER — Encounter: Payer: Self-pay | Admitting: Internal Medicine

## 2012-08-10 ENCOUNTER — Other Ambulatory Visit (HOSPITAL_COMMUNITY): Payer: Self-pay | Admitting: *Deleted

## 2012-08-10 VITALS — BP 144/98 | HR 105 | Temp 98.1°F | Resp 12 | Wt 221.0 lb

## 2012-08-10 DIAGNOSIS — I10 Essential (primary) hypertension: Secondary | ICD-10-CM

## 2012-08-10 DIAGNOSIS — F329 Major depressive disorder, single episode, unspecified: Secondary | ICD-10-CM

## 2012-08-10 MED ORDER — DULOXETINE HCL 60 MG PO CPEP: 60.0000 mg | ORAL_CAPSULE | Freq: Every day | ORAL | Status: DC

## 2012-08-10 MED ORDER — OLMESARTAN-AMLODIPINE-HCTZ 40-5-25 MG PO TABS: 1.0000 | ORAL_TABLET | Freq: Every day | ORAL | Status: DC

## 2012-08-10 NOTE — Progress Notes (Signed)
  Subjective:    Patient ID: Dennis Conley, male    DOB: [birth year], 51 y.o.   MRN: [id number]  Hypertension This is a chronic problem. The current episode started more than 1 year ago. The problem has been gradually worsening since onset. The problem is uncontrolled. Associated symptoms include headaches. Pertinent negatives include no anxiety, blurred vision, chest pain, malaise/fatigue, neck pain, orthopnea, palpitations, peripheral edema, PND, shortness of breath or sweats. Past treatments include angiotensin blockers and diuretics. The current treatment provides mild improvement. There are no compliance problems.       Review of Systems  Constitutional: Negative.  Negative for malaise/fatigue.  HENT: Negative for neck pain.   Eyes: Negative.  Negative for blurred vision.  Respiratory: Negative.  Negative for cough, chest tightness, shortness of breath, wheezing and stridor.   Cardiovascular: Negative for chest pain, palpitations, orthopnea, leg swelling and PND.  Gastrointestinal: Negative.   Genitourinary: Negative.   Musculoskeletal: Negative.   Skin: Negative.   Neurological: Positive for headaches.  Hematological: Negative.  Negative for adenopathy. Does not bruise/bleed easily.  Psychiatric/Behavioral: Negative.        Objective:   Physical Exam  Vitals reviewed. Constitutional: He is oriented to person, place, and time. He appears well-developed and well-nourished. No distress.  HENT:  Head: Normocephalic and atraumatic.  Mouth/Throat: Oropharynx is clear and moist. No oropharyngeal exudate.  Eyes: Conjunctivae normal are normal. Right eye exhibits no discharge. Left eye exhibits no discharge. No scleral icterus.  Neck: Normal range of motion. Neck supple. No JVD present. No tracheal deviation present. No thyromegaly present.  Cardiovascular: Normal rate, regular rhythm, normal heart sounds and intact distal pulses.  Exam reveals no gallop and no friction rub.   No murmur  heard. Pulmonary/Chest: Effort normal and breath sounds normal. No stridor. No respiratory distress. He has no wheezes. He has no rales. He exhibits no tenderness.  Abdominal: Soft. Bowel sounds are normal. He exhibits no distension and no mass. There is no tenderness. There is no rebound and no guarding.  Musculoskeletal: Normal range of motion. He exhibits no edema and no tenderness.  Lymphadenopathy:    He has no cervical adenopathy.  Neurological: He is oriented to person, place, and time.  Skin: Skin is warm and dry. No rash noted. He is not diaphoretic. No erythema. No pallor.  Psychiatric: He has a normal mood and affect. His behavior is normal. Judgment and thought content normal.      Lab Results  Component Value Date   WBC 6.6 07/13/2012   HGB 11.8* 07/13/2012   HCT 35.9* 07/13/2012   PLT 185.0 07/13/2012   GLUCOSE 113* 07/13/2012   CHOL 157 05/12/2012   TRIG 144.0 05/12/2012   HDL 39.60 05/12/2012   LDLDIRECT 138.7 11/20/2011   LDLCALC 89 05/12/2012   ALT 35 05/12/2012   AST 22 05/12/2012   NA 139 07/13/2012   K 4.0 07/13/2012   CL 106 07/13/2012   CREATININE 1.2 07/13/2012   BUN 13 07/13/2012   CO2 23 07/13/2012   TSH 1.91 07/13/2012   PSA 0.53 11/20/2011   INR 1.06 09/12/2010   HGBA1C 7.3* 07/13/2012   MICROALBUR 18.6* 07/31/2011     Assessment & Plan:

## 2012-08-10 NOTE — Assessment & Plan Note (Signed)
His BP is not well controlled so I have upgraded his meds to tribenzor

## 2012-08-10 NOTE — Patient Instructions (Signed)

## 2012-09-02 ENCOUNTER — Ambulatory Visit (INDEPENDENT_AMBULATORY_CARE_PROVIDER_SITE_OTHER): Payer: Self-pay | Admitting: Psychiatry

## 2012-09-02 ENCOUNTER — Encounter (HOSPITAL_COMMUNITY): Payer: Self-pay | Admitting: Psychiatry

## 2012-09-02 DIAGNOSIS — F339 Major depressive disorder, recurrent, unspecified: Secondary | ICD-10-CM

## 2012-09-02 DIAGNOSIS — F329 Major depressive disorder, single episode, unspecified: Secondary | ICD-10-CM

## 2012-09-02 MED ORDER — AMITRIPTYLINE HCL 100 MG PO TABS: 100.0000 mg | ORAL_TABLET | Freq: Every day | ORAL | Status: DC

## 2012-09-02 MED ORDER — DULOXETINE HCL 60 MG PO CPEP: 60.0000 mg | ORAL_CAPSULE | Freq: Every day | ORAL | Status: DC

## 2012-09-02 MED ORDER — CLONAZEPAM 1 MG PO TABS: 1.0000 mg | ORAL_TABLET | Freq: Two times a day (BID) | ORAL | Status: DC

## 2012-09-02 NOTE — Progress Notes (Signed)
Patient ID: Dennis Conley, male   DOB: February 09, 1961, 52 y.o.   MRN: [id number] Chief complaint I like Abilify.  My depression is better.  I had a good Christmas.      History of presenting illness Patient is 52 year old [place] American male who came for his followup appointment.  On his last visit we started him on Abilify 2 mg.  Patient is taking Abilify and feeling better.  He is less depressed less anxious.  He is sleeping better with amitriptyline.  He denies any side effects of medication.  He had a good Christmas.  He's not drinking or using any illegal substance.  He denies any paranoia agitation anger or any mood swing.  Current psychiatric medication Klonopin 1 mg up to twice a day Cymbalta 60 mg Amitriptyline 100 mg at bedtime Abilify 2 mg daily  Past psychiatric history Patient has at least 2 intensive outpatient program in the past due to depression and anxiety. He admitted taking Klonopin and Xanax at a very early age prescribed by primary care Dr. in [place]. He denies any history of suicidal attempt but endorse anxiety and depression. He denies any inpatient psychiatric treatment. He denies any paranoia or psychosis. He denies any history of violence.  Medical history Patient has chronic back pain, neck pain due to fusion and back surgery in the past. He has history of hypertension, benign prostate hyper partial, claudication, elevated total protein and diabetes mellitus.  He takes multiple medication for his diabetes.    Alcohol and substance use history Patient denies any history of alcohol or substance use  Psychosocial history Patient lives with his wife and 5 children who are under 14. He has lived in the past in [place] and [place]. He moved to [place] due to her job is on however he is currently disabled. Since August 2011 he is unable to work and currently on disability.  Review of Systems  Cardiovascular: Negative for chest pain and palpitations.    Musculoskeletal: Positive for back pain and joint pain.  Neurological: Negative for seizures and loss of consciousness.  Psychiatric/Behavioral: Positive for depression. Negative for suicidal ideas, hallucinations and substance abuse. The patient is nervous/anxious.    Mental status examination Patient is casually dressed and fairly groomed. He is pleasant and cooperative.  He maintained good eye contact.. His speech is soft clear and coherent. He described his mood is okay and his affect is mood appropriate.  There were no flight of ideas or loose association.  His thought process is logical linear and goal-directed.  He denies any auditory or visual hallucination.  He denies any active or passive suicidal thoughts or homicidal thoughts. There no psychotic symptoms present. His attention and concentration is fair. His insight judgment and pulse control is okay.  Assessment Axis I Maj. depressive disorder recurrent Axis II deferred Axis III see medical history Axis IV mild to moderate Axis V 55-60   Plan I will continue his current psychiatric medication.  He's taking Cymbalta amitriptyline Abilify and Klonopin.  He changed his pharmacy and getting prescription from [facility].  I explain in detail risk and benefits of medication and recommend to see therapist for coping and social skills. He stil call for morel has Abilify 2 mg samples and he will call us for more samples in the future.  I will see him again in 2 months.    Portion of this note is generated with voice recognition software and may contain typographical error.

## 2012-09-14 ENCOUNTER — Encounter: Payer: Self-pay | Admitting: Internal Medicine

## 2012-09-14 ENCOUNTER — Ambulatory Visit (INDEPENDENT_AMBULATORY_CARE_PROVIDER_SITE_OTHER): Payer: Self-pay | Admitting: Internal Medicine

## 2012-09-14 ENCOUNTER — Other Ambulatory Visit (INDEPENDENT_AMBULATORY_CARE_PROVIDER_SITE_OTHER): Payer: Self-pay

## 2012-09-14 VITALS — BP 120/76 | HR 129 | Temp 98.3°F | Resp 20 | Wt 220.0 lb

## 2012-09-14 DIAGNOSIS — R000 Tachycardia, unspecified: Secondary | ICD-10-CM

## 2012-09-14 DIAGNOSIS — I10 Essential (primary) hypertension: Secondary | ICD-10-CM

## 2012-09-14 LAB — CBC WITH DIFFERENTIAL/PLATELET
Basophils Absolute: 0 10*3/uL (ref 0.0–0.1)
Eosinophils Relative: 2.3 % (ref 0.0–5.0)
MCV: 84.5 fl (ref 78.0–100.0)
Monocytes Absolute: 0.3 10*3/uL (ref 0.1–1.0)
Monocytes Relative: 3.8 % (ref 3.0–12.0)
Neutrophils Relative %: 72.3 % (ref 43.0–77.0)
Platelets: 184 10*3/uL (ref 150.0–400.0)
RDW: 14.5 % (ref 11.5–14.6)
WBC: 6.9 10*3/uL (ref 4.5–10.5)

## 2012-09-14 LAB — COMPREHENSIVE METABOLIC PANEL
ALT: 32 U/L (ref 0–53)
Albumin: 4.8 g/dL (ref 3.5–5.2)
CO2: 23 mEq/L (ref 19–32)
Chloride: 104 mEq/L (ref 96–112)
GFR: 62.78 mL/min (ref >60.00)
Glucose, Bld: 101 mg/dL — ABNORMAL HIGH (ref 70–99)
Potassium: 3.9 mEq/L (ref 3.5–5.1)
Sodium: 140 mEq/L (ref 135–145)
Total Protein: 8.6 g/dL — ABNORMAL HIGH (ref 6.0–8.3)

## 2012-09-14 LAB — URINALYSIS, ROUTINE W REFLEX MICROSCOPIC
Bilirubin Urine: NEGATIVE
Hgb urine dipstick: NEGATIVE
Leukocytes, UA: NEGATIVE
Specific Gravity, Urine: >=1.03 (ref 1.000–1.030)
Urobilinogen, UA: 1 (ref 0.0–1.0)

## 2012-09-14 LAB — CARDIAC PANEL
CK-MB: 0.5 ng/mL (ref 0.3–4.0)
Total CK: 61 U/L (ref 7–232)

## 2012-09-14 LAB — BRAIN NATRIURETIC PEPTIDE: Pro B Natriuretic peptide (BNP): 3 pg/mL (ref 0.0–100.0)

## 2012-09-14 LAB — TSH: TSH: 2.33 u[IU]/mL (ref 0.35–5.50)

## 2012-09-14 NOTE — Progress Notes (Signed)
Subjective:    Patient ID: Dennis Conley, male    DOB: September 16, 1960, 52 y.o.   MRN: [id number]  Hypertension This is a chronic problem. The current episode started more than 1 year ago. The problem has been gradually improving since onset. The problem is controlled. Pertinent negatives include no anxiety, blurred vision, chest pain, headaches, malaise/fatigue, neck pain, orthopnea, palpitations, peripheral edema, PND, shortness of breath or sweats. Past treatments include angiotensin blockers, calcium channel blockers and diuretics. The current treatment provides significant improvement. Compliance problems include exercise and diet.       Review of Systems  Constitutional: Negative for fever, chills, malaise/fatigue, diaphoresis, activity change, appetite change, fatigue and unexpected weight change.  HENT: Negative.  Negative for neck pain.   Eyes: Negative.  Negative for blurred vision.  Respiratory: Negative for cough, chest tightness and shortness of breath.   Cardiovascular: Negative for chest pain, palpitations, orthopnea, leg swelling and PND.  Gastrointestinal: Negative for nausea, vomiting, abdominal pain, diarrhea, constipation and blood in stool.  Genitourinary: Negative.   Musculoskeletal: Positive for back pain (somewhat better). Negative for myalgias, joint swelling, arthralgias and gait problem.  Skin: Negative for color change, pallor, rash and wound.  Neurological: Negative for dizziness, tremors, seizures, syncope, facial asymmetry, speech difficulty, weakness, light-headedness, numbness and headaches.  Hematological: Negative for adenopathy. Does not bruise/bleed easily.  Psychiatric/Behavioral: Negative.        Objective:   Physical Exam  Vitals reviewed. Constitutional: He is oriented to person, place, and time. He appears well-developed and well-nourished. No distress.  HENT:  Head: Normocephalic and atraumatic.  Mouth/Throat: Oropharynx is clear and moist. No  oropharyngeal exudate.  Eyes: Conjunctivae normal are normal. Right eye exhibits no discharge. Left eye exhibits no discharge. No scleral icterus.  Neck: Normal range of motion. Neck supple. No JVD present. No tracheal deviation present. No thyromegaly present.  Cardiovascular: Normal rate, regular rhythm, normal heart sounds and intact distal pulses.  Exam reveals no gallop and no friction rub.   No murmur heard. Pulmonary/Chest: Effort normal and breath sounds normal. No stridor. No respiratory distress. He has no wheezes. He has no rales. He exhibits no tenderness.  Abdominal: Soft. Bowel sounds are normal. He exhibits no distension and no mass. There is no tenderness. There is no rebound and no guarding.  Musculoskeletal: Normal range of motion. He exhibits no edema and no tenderness.  Lymphadenopathy:    He has no cervical adenopathy.  Neurological: He is oriented to person, place, and time.  Skin: Skin is warm and dry. No rash noted. He is not diaphoretic. No erythema. No pallor.  Psychiatric: He has a normal mood and affect. His behavior is normal. Judgment and thought content normal.      Lab Results  Component Value Date   WBC 6.6 07/13/2012   HGB 11.8* 07/13/2012   HCT 35.9* 07/13/2012   PLT 185.0 07/13/2012   GLUCOSE 113* 07/13/2012   CHOL 157 05/12/2012   TRIG 144.0 05/12/2012   HDL 39.60 05/12/2012   LDLDIRECT 138.7 11/20/2011   LDLCALC 89 05/12/2012   ALT 35 05/12/2012   AST 22 05/12/2012   NA 139 07/13/2012   K 4.0 07/13/2012   CL 106 07/13/2012   CREATININE 1.2 07/13/2012   BUN 13 07/13/2012   CO2 23 07/13/2012   TSH 1.91 07/13/2012   PSA 0.53 11/20/2011   INR 1.06 09/12/2010   HGBA1C 7.3* 07/13/2012   MICROALBUR 18.6* 07/31/2011      Assessment & Plan:

## 2012-09-14 NOTE — Patient Instructions (Signed)
Nonspecific Tachycardia  Tachycardia is a faster than normal heartbeat (more than 100 beats per minute). In adults, the heart normally beats between 60 and 100 times a minute. A fast heartbeat may be a normal response to exercise or stress. It does not necessarily mean that something is wrong. However, sometimes when your heart beats too fast it may not be able to pump enough blood to the rest of your body. This can result in chest pain, shortness of breath, dizziness, and even fainting. Nonspecific tachycardia means that the specific cause or pattern of your tachycardia is unknown.  CAUSES   Tachycardia may be harmless or it may be due to a more serious underlying cause. Possible causes of tachycardia include:   Exercise or exertion.   Fever.   Pain or injury.   Infection.   Loss of body fluids (dehydration).   Overactive thyroid.   Lack of red blood cells (anemia).   Anxiety and stress.   Alcohol.   Caffeine.   Tobacco products.   Diet pills.   Illegal drugs.   Heart disease.  SYMPTOMS   Rapid or irregular heartbeat (palpitations).   Suddenly feeling your heart beating (cardiac awareness).   Dizziness.   Tiredness (fatigue).   Shortness of breath.   Chest pain.   Nausea.   Fainting.  DIAGNOSIS   Your caregiver will perform a physical exam and take your medical history. In some cases, a heart specialist (cardiologist) may be consulted. Your caregiver may also order:   Blood tests.   Electrocardiography. This test records the electrical activity of your heart.   A heart monitoring test.  TREATMENT   Treatment will depend on the likely cause of your tachycardia. The goal is to treat the underlying cause of your tachycardia. Treatment methods may include:   Replacement of fluids or blood through an intravenous (IV) tube for moderate to severe dehydration or anemia.   New medicines or changes in your current medicines.   Diet and lifestyle changes.   Treatment for certain  infections.   Stress relief or relaxation methods.  HOME CARE INSTRUCTIONS    Rest.   Drink enough fluids to keep your urine clear or pale yellow.   Do not smoke.   Avoid:   Caffeine.   Tobacco.   Alcohol.   Chocolate.   Stimulants such as over-the-counter diet pills or pills that help you stay awake.   Situations that cause anxiety or stress.   Illegal drugs such as marijuana, phencyclidine (PCP), and cocaine.   Only take medicine as directed by your caregiver.   Keep all follow-up appointments as directed by your caregiver.  SEEK IMMEDIATE MEDICAL CARE IF:    You have pain in your chest, upper arms, jaw, or neck.   You become weak, dizzy, or feel faint.   You have palpitations that will not go away.   You vomit, have diarrhea, or pass blood in your stool.   Your skin is cool, pale, and wet.   You have a fever that will not go away with rest, fluids, and medicine.  MAKE SURE YOU:    Understand these instructions.   Will watch your condition.   Will get help right away if you are not doing well or get worse.  Document Released: 09/05/2004 Document Revised: 10/21/2011 Document Reviewed: 07/09/2011  Exit[employer] Patient Information 2013 [employer].

## 2012-09-15 LAB — DRUGS OF ABUSE SCREEN W/O ALC, ROUTINE URINE
Benzodiazepines.: NEGATIVE
Marijuana Metabolite: NEGATIVE
Methadone: NEGATIVE
Phencyclidine (PCP): NEGATIVE
Propoxyphene: NEGATIVE

## 2012-09-15 NOTE — Assessment & Plan Note (Signed)
His EKG shows sinus tachycardia. I will check him for CHF (BNP), ischemia (cardiac enzymes,troponin),, substance abuse (UDS), anemia, thyroid disease. I have aslo asked him to see cardiology to inquire about the need to treat this.

## 2012-09-15 NOTE — Assessment & Plan Note (Signed)
His BP is well controlled, I will monitor his lytes and renal function today 

## 2012-09-15 NOTE — Assessment & Plan Note (Signed)
His last A1C was good

## 2012-09-24 ENCOUNTER — Encounter: Payer: Self-pay | Admitting: Internal Medicine

## 2012-10-06 ENCOUNTER — Telehealth: Payer: Self-pay

## 2012-10-06 NOTE — Telephone Encounter (Signed)
Patient called inquiring about a disability form that was completed for [employer]. He said that company advised that paperwork  Was received back from MD stating that he agrees with the independent medical examiner opinion. Per pt, he discussed this with MD prior to paperwork being completed that MD  he did not agree with the medical examiner regarding disability. Please advise

## 2012-10-07 ENCOUNTER — Telehealth: Payer: Self-pay | Admitting: Internal Medicine

## 2012-10-07 NOTE — Telephone Encounter (Signed)
Patient notified of MD response. He states that he wants to change MD's if PCP did agree with medical examiner.

## 2012-10-07 NOTE — Telephone Encounter (Signed)
yes

## 2012-10-07 NOTE — Telephone Encounter (Signed)
I did not tell him that, I told him that I was not sure at the time he was in the office. Later, I decided that he should do the PT in order to try to go back to work.

## 2012-10-07 NOTE — Telephone Encounter (Signed)
Patient called stating that he would like to change pcps from Dr. Yetta Barre to Dr. Selena Batten. Please advise.

## 2012-10-08 NOTE — Telephone Encounter (Signed)
lmovm (detailed)

## 2012-10-08 NOTE — Telephone Encounter (Signed)
It looks like he has a complex medical history and is taking medication that I do not prescribe. He won't be a good fit for my practice. I would suggest an internal med doctor.

## 2012-10-15 ENCOUNTER — Other Ambulatory Visit: Payer: Self-pay | Admitting: Endocrinology

## 2012-10-29 ENCOUNTER — Other Ambulatory Visit: Payer: Self-pay | Admitting: Endocrinology

## 2012-10-29 NOTE — Telephone Encounter (Signed)
Do we refill this or dr.jones?

## 2012-11-02 ENCOUNTER — Encounter (HOSPITAL_COMMUNITY): Payer: Self-pay | Admitting: Psychiatry

## 2012-11-02 ENCOUNTER — Ambulatory Visit (INDEPENDENT_AMBULATORY_CARE_PROVIDER_SITE_OTHER): Payer: 59 | Admitting: Psychiatry

## 2012-11-02 DIAGNOSIS — F329 Major depressive disorder, single episode, unspecified: Secondary | ICD-10-CM

## 2012-11-02 DIAGNOSIS — F339 Major depressive disorder, recurrent, unspecified: Secondary | ICD-10-CM

## 2012-11-02 MED ORDER — DULOXETINE HCL 60 MG PO CPEP: 60.0000 mg | ORAL_CAPSULE | Freq: Every day | ORAL | Status: DC

## 2012-11-02 MED ORDER — AMITRIPTYLINE HCL 100 MG PO TABS: 100.0000 mg | ORAL_TABLET | Freq: Every day | ORAL | Status: DC

## 2012-11-02 MED ORDER — CLONAZEPAM 1 MG PO TABS: 1.0000 mg | ORAL_TABLET | Freq: Two times a day (BID) | ORAL | Status: DC

## 2012-11-02 MED ORDER — ARIPIPRAZOLE 2 MG PO TABS: 2.0000 mg | ORAL_TABLET | Freq: Every day | ORAL | Status: DC

## 2012-11-02 NOTE — Progress Notes (Signed)
Patient ID: Dennis Conley, male   DOB: 11/26/1960, 52 y.o.   MRN: [id number] Chief complaint Medication management and followup.      History of presenting illness Patient is 52 year old [place] American male who came for his followup appointment.  He was visiting his mother in [place].  He complained of feeling tired however he denies any recent complain of depression or any anxiety.  He is compliant with medication.  He denies any side effects.  He is any change his primary care physician .  He seeing Dr. Shary Decamp .  He denies any paranoia or any hallucination.  He is thinking to see therapist Dennis Conley taking her counseling.  He denies any recent mood swing or anger.  He's not drinking or using any illegal substance.  Current psychiatric medication Klonopin 1 mg up to twice a day Cymbalta 60 mg Amitriptyline 100 mg at bedtime Abilify 2 mg daily  Past psychiatric history Patient has at least 2 intensive outpatient program in the past due to depression and anxiety. He admitted taking Klonopin and Xanax at a very early age prescribed by primary care Dr. in [place]. He denies any history of suicidal attempt but endorse anxiety and depression. He denies any inpatient psychiatric treatment. He denies any paranoia or psychosis. He denies any history of violence.  Medical history Patient has chronic back pain, neck pain due to fusion and back surgery in the past. He has history of hypertension, benign prostate hyper partial, claudication, elevated total protein and diabetes mellitus.  He takes multiple medication for his diabetes.    Alcohol and substance use history Patient denies any history of alcohol or substance use  Psychosocial history Patient lives with his wife and 5 children who are under 14. He has lived in the past in [place] and [place]. He moved to [place] due to her job is on however he is currently disabled. Since August 2011 he is unable to work and currently on  disability.  Review of Systems  Cardiovascular: Negative for chest pain and palpitations.  Musculoskeletal: Positive for back pain.  Neurological: Negative for seizures and loss of consciousness.  Psychiatric/Behavioral: Negative for suicidal ideas, hallucinations and substance abuse. The patient is nervous/anxious.    Mental status examination Patient is casually dressed and fairly groomed. He is pleasant and cooperative.  He maintained good eye contact.. His speech is soft clear and coherent. He described his mood is okay and his affect is mood appropriate.  There were no flight of ideas or loose association.  His thought process is logical linear and goal-directed.  He denies any auditory or visual hallucination.  He denies any active or passive suicidal thoughts or homicidal thoughts. There no psychotic symptoms present. His attention and concentration is fair. His insight judgment and pulse control is okay.  Assessment Axis I Maj. depressive disorder recurrent Axis II deferred Axis III see medical history Axis IV mild to moderate Axis V 55-60   Plan Patient is taking multiple psychotropic medication however he's been stable and denies any side effects.  I will continue amitriptyline, Abilify, Klonopin and Cymbalta.  Recommend to call us back if he is any question or concern if he feel worsening of the symptom.  I will see him again in 2 months.  Portion of this note is generated with voice recognition software and may contain typographical error.

## 2012-11-18 ENCOUNTER — Ambulatory Visit: Payer: Medicare Other | Admitting: Diagnostic Neuroimaging

## 2012-12-15 ENCOUNTER — Other Ambulatory Visit: Payer: Self-pay | Admitting: *Deleted

## 2012-12-15 ENCOUNTER — Ambulatory Visit (INDEPENDENT_AMBULATORY_CARE_PROVIDER_SITE_OTHER): Payer: Medicare Other | Admitting: Diagnostic Neuroimaging

## 2012-12-15 ENCOUNTER — Encounter: Payer: Self-pay | Admitting: Diagnostic Neuroimaging

## 2012-12-15 VITALS — BP 115/74 | HR 111 | Temp 98.5°F | Ht 74.0 in | Wt 214.0 lb

## 2012-12-15 DIAGNOSIS — IMO0002 Reserved for concepts with insufficient information to code with codable children: Secondary | ICD-10-CM

## 2012-12-15 DIAGNOSIS — M5416 Radiculopathy, lumbar region: Secondary | ICD-10-CM | POA: Insufficient documentation

## 2012-12-15 DIAGNOSIS — E1149 Type 2 diabetes mellitus with other diabetic neurological complication: Secondary | ICD-10-CM

## 2012-12-15 DIAGNOSIS — E1142 Type 2 diabetes mellitus with diabetic polyneuropathy: Secondary | ICD-10-CM

## 2012-12-15 DIAGNOSIS — E1140 Type 2 diabetes mellitus with diabetic neuropathy, unspecified: Secondary | ICD-10-CM

## 2012-12-15 MED ORDER — GABAPENTIN 800 MG PO TABS: 800.0000 mg | ORAL_TABLET | Freq: Three times a day (TID) | ORAL | Status: DC

## 2012-12-15 NOTE — Progress Notes (Signed)
[facility]  PATIENT: Dennis Conley DOB: 1960-11-16  REFERRING CLINICIAN: Thea Silversmith HISTORY FROM: patient REASON FOR VISIT: new consult   HISTORICAL  CHIEF COMPLAINT:  Chief Complaint  Patient presents with  . Peripheral Neuropathy    HISTORY OF PRESENT ILLNESS: 52 year old AA male who seeks treatment options for peripheral neuropathy in feet and legs.    He had lumbar fusion in 2006 which later needed revision for loose hardware.  He has a pain specialist, which prescribes his Oxycodone.  He describes pain in his feet and leg for 3 years which became worse after being diagnosed with Diabetes in 2012.  The pain in the left foot feels like a hot nail is being driven in his big toe.  He often drags the left foot because it hurts so much.  The right foot has burning pain.  He takes Gabapentin for the Neuropathic pain and Elavil for sleep.  The pain in his feet has become so bad that he had to quit wearing socks.  2011 had spinal cord stimulator implanted by Dr. Yevette Edwards , only worked for 1 month.  (Device stopped working).  Option was given to place another one above it, be he declined because he did not want two in his back.  He would like the original one removed.  He would like to see what other options are available to treat is neuropathy.   REVIEW OF SYSTEMS: Full 14 system review of systems performed and notable only for velocity trouble swallowing constipation blurred vision no numbness difficulty swallowing depression anxiety.  ALLERGIES: Allergies  Allergen Reactions  . Bystolic (Nebivolol Hcl)     Makes him sweat too much    HOME MEDICATIONS: Outpatient Prescriptions Prior to Visit  Medication Sig Dispense Refill  . amitriptyline (ELAVIL) 100 MG tablet Take 1 tablet (100 mg total) by mouth at bedtime.  30 tablet  1  . ARIPiprazole (ABILIFY) 2 MG tablet Take 1 tablet (2 mg total) by mouth daily.  30 tablet  1  . clonazePAM (KLONOPIN) 1 MG tablet Take  1 tablet (1 mg total) by mouth 2 (two) times daily.  60 tablet  1  . dicyclomine (BENTYL) 10 MG capsule Take 10 mg by mouth as needed.      . DULoxetine (CYMBALTA) 60 MG capsule Take 1 capsule (60 mg total) by mouth daily.  30 capsule  1  . fluticasone (FLOVENT HFA) 220 MCG/ACT inhaler 2 puffs swallowed  Twice daily.  1 Inhaler  12  . gabapentin (NEURONTIN) 600 MG tablet Take 1 tablet (600 mg total) by mouth 3 (three) times daily.  90 tablet  11  . Olmesartan-Amlodipine-HCTZ (TRIBENZOR) 40-5-25 MG TABS Take 1 tablet by mouth daily.  70 tablet  0  . omega-3 acid ethyl esters (LOVAZA) 1 G capsule Take 2 g by mouth 2 (two) times daily.      . pioglitazone (ACTOS) 45 MG tablet 1/2 pill daily  90 tablet  3  . Probiotic Product (ALIGN) 4 MG CAPS Take 1 capsule by mouth daily.  28 capsule  0  . RABEprazole (ACIPHEX) 20 MG tablet Take 1 tablet (20 mg total) by mouth daily.  30 tablet  11  . rosuvastatin (CRESTOR) 10 MG tablet Take 10 mg by mouth at bedtime.      . sitaGLIPtan-metformin (JANUMET) 50-1000 MG per tablet Take 1 tablet by mouth 2 (two) times daily with a meal.      . tadalafil (CIALIS) 5 MG  tablet Take 1 tablet (5 mg total) by mouth daily as needed for erectile dysfunction.  30 tablet  11   No facility-administered medications prior to visit.    PAST MEDICAL HISTORY: Past Medical History  Diagnosis Date  . ERECTILE DYSFUNCTION 10/29/2010  . Chronic headaches 10/29/2010  . HYPERLIPIDEMIA 10/29/2010  . OSTEOARTHRITIS 10/29/2010  . DEPRESSION 10/29/2010    Dr Dub Mikes  . IBS (irritable bowel syndrome)   . GERD (gastroesophageal reflux disease)   . Anxiety   . Hypertension   . Eosinophilic esophagitis   . Diabetes mellitus   . Hypertension     PAST SURGICAL HISTORY: Past Surgical History  Procedure Laterality Date  . Cervical fusion    . Cervical laminectomy  2012    Dr. Yevette Edwards  . Lumbar laminectomy  2007  . Lumbar fusion  2006  . Nasal sinus surgery    . Spinal cord stimulator  implant  2011    FAMILY HISTORY: Family History  Problem Relation Age of Onset  . Alcohol abuse Father   . Hypertension Father   . Hyperlipidemia Father   . Prostate cancer Father   . Arthritis Father   . Diabetes Father   . Arthritis Mother   . Diabetes Mother   . Hypertension Mother   . Stroke Paternal Grandmother   . Heart disease Paternal Grandmother   . Colon cancer Maternal Grandfather 79  . Heart disease Paternal Uncle     x 2  . Colon cancer Cousin 48  . Esophageal cancer Paternal Uncle   . Stroke Maternal Grandmother   . Prostate cancer Paternal Uncle     x 4  . Prostate cancer Cousin     x 2    SOCIAL HISTORY:  History   Social History  . Marital Status: Married    Spouse Name: Elnita Maxwell    Number of Children: 5  . Years of Education: College   Occupational History  . Disabled    Social History Main Topics  . Smoking status: Never Smoker   . Smokeless tobacco: Never Used  . Alcohol Use: No  . Drug Use: No  . Sexually Active: Yes   Other Topics Concern  . Not on file   Social History Narrative   Pt lives at home with his spouse.    3 caffeine drinks daily      PHYSICAL EXAM  Filed Vitals:   12/15/12 0901  BP: 115/74  Pulse: 111  Temp: 98.5 F (36.9 C)  TempSrc: Oral  Height: 6\' 2"  (1.88 m)  Weight: 214 lb (97.07 kg)   Body mass index is 27.46 kg/(m^2).  GENERAL EXAM: Patient is in no distress; WEARING SANDALS, NO SOCKS.  CARDIOVASCULAR: Regular rate and rhythm, no murmurs, no carotid bruits  NEUROLOGIC: MENTAL STATUS: awake, alert, language fluent, comprehension intact, naming intact CRANIAL NERVE: no papilledema on fundoscopic exam, pupils equal and reactive to light, visual fields full to confrontation, extraocular muscles intact, no nystagmus, facial sensation and strength symmetric, uvula midline, shoulder shrug symmetric, tongue midline. MOTOR: normal bulk and tone, full strength in the BUE, BLE SENSORY: normal and symmetric  to light touch, pinprick, temperature, vibration and proprioception; EXCEPT SLIGHTLY DECREASED PP IN BOTTOM OF LEFT FOOT. COORDINATION: finger-nose-finger, fine finger movements normal REFLEXES: deep tendon reflexes present and symmetric (BUE AND BLE 2). DOWN GOING TOES. 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 GAIT/STATION: ANTALGIC, narrow based gait; able to walk on toes, heels and tandem; romberg is negative   DIAGNOSTIC DATA (  LABS, IMAGING, TESTING) - I reviewed patient records, labs, notes, testing and imaging myself where available.  Lab Results  Component Value Date   WBC 6.9 09/14/2012   HGB 12.4* 09/14/2012   HCT 37.4* 09/14/2012   MCV 84.5 09/14/2012   PLT 184.0 09/14/2012      Component Value Date/Time   NA 140 09/14/2012 0906   K 3.9 09/14/2012 0906   CL 104 09/14/2012 0906   CO2 23 09/14/2012 0906   GLUCOSE 101* 09/14/2012 0906   BUN 16 09/14/2012 0906   CREATININE 1.5 09/14/2012 0906   CALCIUM 9.4 09/14/2012 0906   PROT 8.6* 09/14/2012 0906   ALBUMIN 4.8 09/14/2012 0906   AST 22 09/14/2012 0906   ALT 32 09/14/2012 0906   ALKPHOS 90 09/14/2012 0906   BILITOT 0.3 09/14/2012 0906   GFRNONAA >90 09/30/2011 2146   GFRAA >90 09/30/2011 2146   Lab Results  Component Value Date   CHOL 157 05/12/2012   HDL 39.60 05/12/2012   LDLCALC 89 05/12/2012   LDLDIRECT 138.7 11/20/2011   TRIG 144.0 05/12/2012   CHOLHDL 4 05/12/2012   Lab Results  Component Value Date   HGBA1C 7.3* 07/13/2012   No results found for this basename: VITAMINB12   Lab Results  Component Value Date   TSH 2.33 09/14/2012     ASSESSMENT AND PLAN  52 y.o. year old male  has a past medical history of ERECTILE DYSFUNCTION (10/29/2010); Chronic headaches (10/29/2010); HYPERLIPIDEMIA (10/29/2010); OSTEOARTHRITIS (10/29/2010); DEPRESSION (10/29/2010); IBS (irritable bowel syndrome); GERD (gastroesophageal reflux disease); Anxiety; Hypertension; Eosinophilic esophagitis; Diabetes mellitus; and Hypertension. here with progressive numbness and burning of  his feet. Symptoms of left lower extremity related to lumbar radiculopathy. Right foot symptoms may represent diabetic peripheral neuropathy.  PLAN: - B12 level - increase gabapentin to 800mg  TID - trial of compounded neuropathy cream   Suanne Marker, MD 12/15/2012, 9:47 AM (with LYNN LAM NP-C) Certified in Neurology, Neurophysiology and Neuroimaging  [facility] [address], [address] [place], [place] [address] [phone]

## 2012-12-15 NOTE — Patient Instructions (Signed)
Try compounded neuropathy cream.  Increase gabapentin to 800mg  three times per day.

## 2012-12-27 ENCOUNTER — Other Ambulatory Visit: Payer: Self-pay | Admitting: Endocrinology

## 2012-12-28 ENCOUNTER — Other Ambulatory Visit: Payer: Self-pay | Admitting: *Deleted

## 2012-12-28 ENCOUNTER — Other Ambulatory Visit (HOSPITAL_COMMUNITY): Payer: Self-pay | Admitting: *Deleted

## 2012-12-28 DIAGNOSIS — F329 Major depressive disorder, single episode, unspecified: Secondary | ICD-10-CM

## 2012-12-28 MED ORDER — ARIPIPRAZOLE 2 MG PO TABS: 2.0000 mg | ORAL_TABLET | Freq: Every day | ORAL | Status: DC

## 2012-12-28 MED ORDER — PIOGLITAZONE HCL 45 MG PO TABS: ORAL_TABLET | ORAL | Status: DC

## 2013-01-01 ENCOUNTER — Other Ambulatory Visit (HOSPITAL_COMMUNITY): Payer: Self-pay | Admitting: Psychiatry

## 2013-01-02 ENCOUNTER — Other Ambulatory Visit: Payer: Self-pay | Admitting: Psychiatry

## 2013-01-02 NOTE — Telephone Encounter (Signed)
Given on 12/28/12

## 2013-01-05 ENCOUNTER — Other Ambulatory Visit (HOSPITAL_COMMUNITY): Payer: Self-pay | Admitting: *Deleted

## 2013-01-05 ENCOUNTER — Ambulatory Visit (HOSPITAL_COMMUNITY): Payer: 59 | Admitting: Psychiatry

## 2013-01-05 DIAGNOSIS — F329 Major depressive disorder, single episode, unspecified: Secondary | ICD-10-CM

## 2013-01-05 MED ORDER — DULOXETINE HCL 60 MG PO CPEP: 60.0000 mg | ORAL_CAPSULE | Freq: Every day | ORAL | Status: DC

## 2013-01-06 ENCOUNTER — Other Ambulatory Visit (HOSPITAL_COMMUNITY): Payer: Self-pay | Admitting: *Deleted

## 2013-01-08 ENCOUNTER — Ambulatory Visit (HOSPITAL_COMMUNITY): Payer: Self-pay | Admitting: Psychiatry

## 2013-01-13 ENCOUNTER — Other Ambulatory Visit (HOSPITAL_COMMUNITY): Payer: Self-pay | Admitting: *Deleted

## 2013-01-13 DIAGNOSIS — F329 Major depressive disorder, single episode, unspecified: Secondary | ICD-10-CM

## 2013-01-13 MED ORDER — CLONAZEPAM 1 MG PO TABS: 1.0000 mg | ORAL_TABLET | Freq: Two times a day (BID) | ORAL | Status: DC

## 2013-01-13 MED ORDER — AMITRIPTYLINE HCL 100 MG PO TABS: 100.0000 mg | ORAL_TABLET | Freq: Every day | ORAL | Status: DC

## 2013-01-26 ENCOUNTER — Encounter (HOSPITAL_COMMUNITY): Payer: Self-pay | Admitting: Psychiatry

## 2013-01-26 ENCOUNTER — Ambulatory Visit (INDEPENDENT_AMBULATORY_CARE_PROVIDER_SITE_OTHER): Payer: 59 | Admitting: Psychiatry

## 2013-01-26 VITALS — BP 100/55 | Wt 215.0 lb

## 2013-01-26 DIAGNOSIS — F339 Major depressive disorder, recurrent, unspecified: Secondary | ICD-10-CM

## 2013-01-26 DIAGNOSIS — F329 Major depressive disorder, single episode, unspecified: Secondary | ICD-10-CM

## 2013-01-26 MED ORDER — DULOXETINE HCL 60 MG PO CPEP: 60.0000 mg | ORAL_CAPSULE | Freq: Every day | ORAL | Status: DC

## 2013-01-26 MED ORDER — ARIPIPRAZOLE 5 MG PO TABS: 5.0000 mg | ORAL_TABLET | Freq: Every day | ORAL | Status: DC

## 2013-01-26 NOTE — Progress Notes (Signed)
[facility] [address] Progress Note  Dennis Conley [id number] 52 y.o.  01/26/2013 4:30 PM  Chief Complaint: I'm under a lot of stress.  History of Present Illness: Patient is 52 year old [place] American male who came for his followup appointment.  Patient has noticed increased depression and anxiety in past few weeks.  His daughter was admitted to behavioral Center after taking overdose on her medication.  Her cousin in [place] also try to commit suicide.  Patient is feeling overwhelmed in recent weeks.  He complained of poor sleep, sadness and having crying spells.  He admitted decreased energy and concentration denies any active or passive suicidal thoughts.  He also seemed recently neurology for chronic pain and his Neurontin was increased.  She sleeping on and off.  Denies any hallucination or any paranoia but admitted more depressed than usual.  Suicidal Ideation: No Plan Formed: No Patient has means to carry out plan: No  Homicidal Ideation: No Plan Formed: No Patient has means to carry out plan: No  Review of Systems: Psychiatric: Agitation: No Hallucination: No Depressed Mood: Yes Insomnia: Yes Hypersomnia: No Altered Concentration: No Feels Worthless: Yes Grandiose Ideas: No Belief In Special Powers: No New/Increased Substance Abuse: No Compulsions: No  Neurologic: Headache: Yes Seizure: No Paresthesias: Yes  Past Medical Family, Social History: Patient has history of chronic back pain, diabetic neuropathy, neck pain, back surgery, hypertension, benign prostatic hypertrophy, claudication, IBS.  He is taking multiple medication for his pain and diabetes.  He lives with his wife and 5 children who are under 14.  He has lived in the past in [place] and be dressed.  He moved in [place] due to his job however he is currently disabled.  He is part working since August 2011.  Outpatient Encounter Prescriptions as of 01/26/2013  Medication Sig Dispense Refill   . amitriptyline (ELAVIL) 100 MG tablet Take 1 tablet (100 mg total) by mouth at bedtime.  30 tablet  0  . ARIPiprazole (ABILIFY) 5 MG tablet Take 1 tablet (5 mg total) by mouth daily.  30 tablet  0  . clonazePAM (KLONOPIN) 1 MG tablet Take 1 tablet (1 mg total) by mouth 2 (two) times daily.  60 tablet  0  . cyclobenzaprine (FLEXERIL) 5 MG tablet       . dicyclomine (BENTYL) 10 MG capsule Take 10 mg by mouth as needed.      . DULoxetine (CYMBALTA) 60 MG capsule Take 1 capsule (60 mg total) by mouth daily.  30 capsule  0  . fluticasone (FLOVENT HFA) 220 MCG/ACT inhaler 2 puffs swallowed  Twice daily.  1 Inhaler  12  . gabapentin (NEURONTIN) 800 MG tablet Take 1 tablet (800 mg total) by mouth 3 (three) times daily.  90 tablet  12  . meloxicam (MOBIC) 15 MG tablet       . Olmesartan-Amlodipine-HCTZ (TRIBENZOR) 40-5-25 MG TABS Take 1 tablet by mouth daily.  70 tablet  0  . omega-3 acid ethyl esters (LOVAZA) 1 G capsule Take 2 g by mouth 2 (two) times daily.      Marland Kitchen oxycodone (ROXICODONE) 30 MG immediate release tablet       . pioglitazone (ACTOS) 45 MG tablet 1/2 pill daily  90 tablet  0  . Probiotic Product (ALIGN) 4 MG CAPS Take 1 capsule by mouth daily.  28 capsule  0  . rosuvastatin (CRESTOR) 10 MG tablet Take 10 mg by mouth at bedtime.      . sitaGLIPtan-metformin (JANUMET)  50-1000 MG per tablet Take 1 tablet by mouth 2 (two) times daily with a meal.      . [DISCONTINUED] ARIPiprazole (ABILIFY) 2 MG tablet Take 1 tablet (2 mg total) by mouth daily.  30 tablet  0  . [DISCONTINUED] DULoxetine (CYMBALTA) 60 MG capsule Take 1 capsule (60 mg total) by mouth daily.  30 capsule  0  . RABEprazole (ACIPHEX) 20 MG tablet Take 1 tablet (20 mg total) by mouth daily.  30 tablet  11  . tadalafil (CIALIS) 5 MG tablet Take 1 tablet (5 mg total) by mouth daily as needed for erectile dysfunction.  30 tablet  11   No facility-administered encounter medications on file as of 01/26/2013.    Past Psychiatric  History/Hospitalization(s): Anxiety: Yes Bipolar Disorder: No Depression: Yes Mania: No Psychosis: No Schizophrenia: No Personality Disorder: No Hospitalization for psychiatric illness: No History of Electroconvulsive Shock Therapy: No Prior Suicide Attempts: No  Physical Exam: Constitutional:  BP 100/55  Wt 215 lb (97.523 kg)  BMI 27.59 kg/m2  General Appearance: alert, oriented, no acute distress  Musculoskeletal: Strength & Muscle Tone: decreased Gait & Station: normal Patient leans: N/A  Psychiatric: Speech (describe rate, volume, coherence, spontaneity, and abnormalities if any): Soft.  The decreased volume and tone  Thought Process (describe rate, content, abstract reasoning, and computation): Logical and goal-directed  Associations: Relevant and Intact  Thoughts: Preoccupied with his depressive thoughts.  No delusion or paranoia.  Mental Status: Orientation: oriented to person, place, time/date, situation and day of week Mood & Affect: depressed affect and anxiety Attention Span & Concentration: Fair  Medical Decision Making (Choose Three): Established Problem, Stable/Improving (1), New problem, with additional work up planned, Review of Psycho-Social Stressors (1), Decision to obtain old records (1), Review of Last Therapy Session (1), Review of Medication Regimen & Side Effects (2) and Review of New Medication or Change in Dosage (2)  Assessment: Axis I: Maj. depressive disorder recurrent  Axis II: Deferred  Axis III:  Patient Active Problem List   Diagnosis Date Noted  . Diabetic neuropathy 12/15/2012  . Lumbar radiculopathy 12/15/2012  . Tachycardia 07/13/2012  . Elevated total protein 11/22/2011  . BPH (benign prostatic hyperplasia) 11/20/2011  . Eosinophilic esophagitis 08/09/2011  . Type II or unspecified type diabetes mellitus without mention of complication, uncontrolled 07/31/2011  . GERD (gastroesophageal reflux disease) 03/08/2011  .  Essential hypertension, benign 01/23/2011  . Pure hypercholesterolemia 01/23/2011  . Chronic back pain 01/03/2011  . Preventative health care 01/02/2011  . ERECTILE DYSFUNCTION 10/29/2010  . DEPRESSION 10/29/2010  . OSTEOARTHRITIS 10/29/2010    Axis IV: Moderate  Axis V: 55-60   Plan: I reviewed his symptoms, psychosocial stressors in current medication.  He also see neurologist recently and his gabapentin was increased.  I would increase his Abilify to 5 mg to target it is a dual mood symptoms.  He would continue Klonopin amitriptyline and Cymbalta at present dose.  We will schedule appointment with a therapist for coping and social skills.  Discussed in detail safety plan that anytime having active suicidal thoughts or homicidal thoughts and he to call 911 or go to local emergency room.  I will see him again in 2 weeks.  Time spent 25 minutes.  More than 50% of the time spent and psychoeducation, counseling and coordination of care.  Yomaira Solar T., MD 01/26/2013

## 2013-01-31 DIAGNOSIS — R55 Syncope and collapse: Principal | ICD-10-CM

## 2013-01-31 HISTORY — DX: Syncope and collapse: R55

## 2013-02-01 ENCOUNTER — Ambulatory Visit (HOSPITAL_COMMUNITY): Payer: 59 | Admitting: Psychiatry

## 2013-02-02 ENCOUNTER — Emergency Department (HOSPITAL_COMMUNITY): Payer: Medicare Other

## 2013-02-02 ENCOUNTER — Observation Stay (HOSPITAL_COMMUNITY)
Admission: EM | Admit: 2013-02-02 | Discharge: 2013-02-05 | Disposition: A | Discharge status: Home / Self Care | Payer: Medicare Other | Attending: Internal Medicine | Admitting: Internal Medicine

## 2013-02-02 ENCOUNTER — Encounter (HOSPITAL_COMMUNITY): Payer: Self-pay | Admitting: *Deleted

## 2013-02-02 DIAGNOSIS — N400 Enlarged prostate without lower urinary tract symptoms: Secondary | ICD-10-CM

## 2013-02-02 DIAGNOSIS — G8929 Other chronic pain: Secondary | ICD-10-CM

## 2013-02-02 DIAGNOSIS — S92909B Unspecified fracture of unspecified foot, initial encounter for open fracture: Secondary | ICD-10-CM

## 2013-02-02 DIAGNOSIS — I10 Essential (primary) hypertension: Secondary | ICD-10-CM | POA: Insufficient documentation

## 2013-02-02 DIAGNOSIS — R55 Syncope and collapse: Principal | ICD-10-CM | POA: Insufficient documentation

## 2013-02-02 DIAGNOSIS — S92309A Fracture of unspecified metatarsal bone(s), unspecified foot, initial encounter for closed fracture: Secondary | ICD-10-CM | POA: Insufficient documentation

## 2013-02-02 DIAGNOSIS — D649 Anemia, unspecified: Secondary | ICD-10-CM | POA: Insufficient documentation

## 2013-02-02 DIAGNOSIS — F329 Major depressive disorder, single episode, unspecified: Secondary | ICD-10-CM

## 2013-02-02 DIAGNOSIS — Z79899 Other long term (current) drug therapy: Secondary | ICD-10-CM | POA: Insufficient documentation

## 2013-02-02 DIAGNOSIS — S92901B Unspecified fracture of right foot, initial encounter for open fracture: Secondary | ICD-10-CM

## 2013-02-02 DIAGNOSIS — N179 Acute kidney failure, unspecified: Secondary | ICD-10-CM | POA: Insufficient documentation

## 2013-02-02 DIAGNOSIS — Y92009 Unspecified place in unspecified non-institutional (private) residence as the place of occurrence of the external cause: Secondary | ICD-10-CM | POA: Insufficient documentation

## 2013-02-02 DIAGNOSIS — W19XXXA Unspecified fall, initial encounter: Secondary | ICD-10-CM | POA: Insufficient documentation

## 2013-02-02 DIAGNOSIS — M549 Dorsalgia, unspecified: Secondary | ICD-10-CM

## 2013-02-02 DIAGNOSIS — S92301A Fracture of unspecified metatarsal bone(s), right foot, initial encounter for closed fracture: Secondary | ICD-10-CM

## 2013-02-02 DIAGNOSIS — IMO0001 Reserved for inherently not codable concepts without codable children: Secondary | ICD-10-CM | POA: Insufficient documentation

## 2013-02-02 DIAGNOSIS — E7800 Pure hypercholesterolemia, unspecified: Secondary | ICD-10-CM | POA: Insufficient documentation

## 2013-02-02 DIAGNOSIS — S92901A Unspecified fracture of right foot, initial encounter for closed fracture: Secondary | ICD-10-CM

## 2013-02-02 HISTORY — DX: Selective deficiency of immunoglobulin a (iga): D80.2

## 2013-02-02 HISTORY — DX: Type 2 diabetes mellitus without complications: E11.9

## 2013-02-02 HISTORY — DX: Other chronic pain: G89.29

## 2013-02-02 HISTORY — DX: Low back pain, unspecified: M54.50

## 2013-02-02 HISTORY — DX: Syncope and collapse: R55

## 2013-02-02 HISTORY — DX: Migraine, unspecified, not intractable, without status migrainosus: G43.909

## 2013-02-02 HISTORY — DX: Low back pain: M54.5

## 2013-02-02 LAB — CBC
HCT: 33.5 % — ABNORMAL LOW (ref 39.0–52.0)
Hemoglobin: 10.8 g/dL — ABNORMAL LOW (ref 13.0–17.0)
MCH: 27.3 pg (ref 26.0–34.0)
MCHC: 32.2 g/dL (ref 30.0–36.0)
RDW: 12.9 % (ref 11.5–15.5)

## 2013-02-02 LAB — BASIC METABOLIC PANEL
BUN: 24 mg/dL — ABNORMAL HIGH (ref 6–23)
Creatinine, Ser: 2.31 mg/dL — ABNORMAL HIGH (ref 0.50–1.35)
GFR calc Af Amer: 36 mL/min — ABNORMAL LOW (ref >90)
GFR calc non Af Amer: 31 mL/min — ABNORMAL LOW (ref >90)
Glucose, Bld: 114 mg/dL — ABNORMAL HIGH (ref 70–99)
Potassium: 4.1 mEq/L (ref 3.5–5.1)

## 2013-02-02 LAB — HEPATIC FUNCTION PANEL
AST: 23 U/L (ref 0–37)
Albumin: 4.1 g/dL (ref 3.5–5.2)
Alkaline Phosphatase: 95 U/L (ref 39–117)
Bilirubin, Direct: <0.1 mg/dL (ref 0.0–0.3)
Total Bilirubin: 0.2 mg/dL — ABNORMAL LOW (ref 0.3–1.2)

## 2013-02-02 LAB — POCT I-STAT TROPONIN I: Troponin i, poc: 0.04 ng/mL (ref 0.00–0.08)

## 2013-02-02 LAB — TROPONIN I: Troponin I: <0.3 ng/mL (ref <0.30)

## 2013-02-02 LAB — GLUCOSE, CAPILLARY
Glucose-Capillary: 122 mg/dL — ABNORMAL HIGH (ref 70–99)
Glucose-Capillary: 114 mg/dL — ABNORMAL HIGH (ref 70–99)

## 2013-02-02 LAB — CK TOTAL AND CKMB (NOT AT ARMC): Relative Index: 1.2 (ref 0.0–2.5)

## 2013-02-02 MED ORDER — SODIUM CHLORIDE 0.9 % IV SOLN
INTRAVENOUS | Status: DC
  Administered 2013-02-02 (×2): via INTRAVENOUS

## 2013-02-02 MED ORDER — ACETAMINOPHEN 325 MG PO TABS: 650.0000 mg | ORAL_TABLET | Freq: Four times a day (QID) | ORAL | Status: DC | PRN

## 2013-02-02 MED ORDER — GABAPENTIN 800 MG PO TABS
800.0000 mg | ORAL_TABLET | Freq: Three times a day (TID) | ORAL | Status: DC
  Filled 2013-02-02: qty 1

## 2013-02-02 MED ORDER — ARIPIPRAZOLE 5 MG PO TABS
5.0000 mg | ORAL_TABLET | Freq: Every day | ORAL | Status: DC
  Administered 2013-02-03 – 2013-02-05 (×3): 5 mg via ORAL
  Filled 2013-02-02 (×3): qty 1

## 2013-02-02 MED ORDER — SODIUM CHLORIDE 0.9 % IV SOLN: INTRAVENOUS | Status: DC

## 2013-02-02 MED ORDER — DULOXETINE HCL 60 MG PO CPEP
60.0000 mg | ORAL_CAPSULE | Freq: Every day | ORAL | Status: DC
  Administered 2013-02-03 – 2013-02-05 (×3): 60 mg via ORAL
  Filled 2013-02-02 (×3): qty 1

## 2013-02-02 MED ORDER — ONDANSETRON HCL 4 MG/2ML IJ SOLN: 4.0000 mg | Freq: Four times a day (QID) | INTRAMUSCULAR | Status: DC | PRN

## 2013-02-02 MED ORDER — PANTOPRAZOLE SODIUM 40 MG PO TBEC
40.0000 mg | DELAYED_RELEASE_TABLET | Freq: Every day | ORAL | Status: DC
  Administered 2013-02-02 – 2013-02-05 (×4): 40 mg via ORAL
  Filled 2013-02-02 (×4): qty 1

## 2013-02-02 MED ORDER — INSULIN ASPART 100 UNIT/ML ~~LOC~~ SOLN: 0.0000 [IU] | Freq: Three times a day (TID) | SUBCUTANEOUS | Status: DC

## 2013-02-02 MED ORDER — SODIUM CHLORIDE 0.9 % IJ SOLN
3.0000 mL | Freq: Two times a day (BID) | INTRAMUSCULAR | Status: DC
  Administered 2013-02-03 – 2013-02-05 (×5): 3 mL via INTRAVENOUS

## 2013-02-02 MED ORDER — ALUM & MAG HYDROXIDE-SIMETH 200-200-20 MG/5ML PO SUSP: 30.0000 mL | Freq: Four times a day (QID) | ORAL | Status: DC | PRN

## 2013-02-02 MED ORDER — GABAPENTIN 400 MG PO CAPS
800.0000 mg | ORAL_CAPSULE | Freq: Three times a day (TID) | ORAL | Status: DC
  Administered 2013-02-02 – 2013-02-05 (×8): 800 mg via ORAL
  Filled 2013-02-02 (×10): qty 2

## 2013-02-02 MED ORDER — ATORVASTATIN CALCIUM 80 MG PO TABS
80.0000 mg | ORAL_TABLET | Freq: Every day | ORAL | Status: DC
  Filled 2013-02-02 (×3): qty 1

## 2013-02-02 MED ORDER — HYDROMORPHONE HCL PF 1 MG/ML IJ SOLN
1.0000 mg | INTRAMUSCULAR | Status: DC | PRN
  Administered 2013-02-02 – 2013-02-04 (×4): 1 mg via INTRAVENOUS
  Filled 2013-02-02 (×4): qty 1

## 2013-02-02 MED ORDER — INSULIN ASPART 100 UNIT/ML ~~LOC~~ SOLN: 0.0000 [IU] | Freq: Every day | SUBCUTANEOUS | Status: DC

## 2013-02-02 MED ORDER — SODIUM CHLORIDE 0.9 % IV BOLUS (SEPSIS)
2000.0000 mL | Freq: Once | INTRAVENOUS | Status: AC
  Administered 2013-02-02: 2000 mL via INTRAVENOUS

## 2013-02-02 MED ORDER — DICYCLOMINE HCL 10 MG PO CAPS
10.0000 mg | ORAL_CAPSULE | Freq: Every day | ORAL | Status: DC | PRN
  Filled 2013-02-02: qty 1

## 2013-02-02 MED ORDER — ACETAMINOPHEN 650 MG RE SUPP: 650.0000 mg | Freq: Four times a day (QID) | RECTAL | Status: DC | PRN

## 2013-02-02 MED ORDER — OMEGA-3-ACID ETHYL ESTERS 1 G PO CAPS
2.0000 g | ORAL_CAPSULE | Freq: Two times a day (BID) | ORAL | Status: DC
  Administered 2013-02-02 – 2013-02-05 (×6): 2 g via ORAL
  Filled 2013-02-02 (×8): qty 2

## 2013-02-02 MED ORDER — CLONAZEPAM 1 MG PO TABS
1.0000 mg | ORAL_TABLET | Freq: Two times a day (BID) | ORAL | Status: DC
  Administered 2013-02-03 – 2013-02-05 (×5): 1 mg via ORAL
  Filled 2013-02-02 (×5): qty 1

## 2013-02-02 MED ORDER — AMITRIPTYLINE HCL 100 MG PO TABS
100.0000 mg | ORAL_TABLET | Freq: Every day | ORAL | Status: DC
  Administered 2013-02-02 – 2013-02-04 (×3): 100 mg via ORAL
  Filled 2013-02-02 (×5): qty 1

## 2013-02-02 MED ORDER — ONDANSETRON HCL 4 MG PO TABS: 4.0000 mg | ORAL_TABLET | Freq: Four times a day (QID) | ORAL | Status: DC | PRN

## 2013-02-02 MED ORDER — ENOXAPARIN SODIUM 30 MG/0.3ML ~~LOC~~ SOLN
30.0000 mg | SUBCUTANEOUS | Status: DC
  Administered 2013-02-02: 30 mg via SUBCUTANEOUS
  Filled 2013-02-02 (×2): qty 0.3

## 2013-02-02 MED ORDER — OXYCODONE HCL 5 MG PO TABS
30.0000 mg | ORAL_TABLET | Freq: Four times a day (QID) | ORAL | Status: DC
  Administered 2013-02-02: 30 mg via ORAL
  Filled 2013-02-02: qty 6

## 2013-02-02 NOTE — ED Notes (Signed)
Paged ortho team.

## 2013-02-02 NOTE — ED Provider Notes (Signed)
History    CSN: [id number] Arrival date & time 02/02/13  1354  First MD Initiated Contact with Patient 02/02/13 1553     Chief Complaint  Patient presents with  . Loss of Consciousness  . Foot Pain   (Consider location/radiation/quality/duration/timing/severity/associated sxs/prior Treatment) Patient is a 52 y.o. male presenting with fall and lower extremity pain. The history is provided by the patient.  Fall This is a new problem. The current episode started yesterday. The problem occurs constantly. The problem has been gradually worsening. Pertinent negatives include no abdominal pain, chest pain, chills, congestion, fatigue, fever, headaches, nausea, neck pain, numbness, vomiting or weakness. Exacerbated by: bearing weight. He has tried nothing for the symptoms.  Foot Pain This is a new problem. The current episode started yesterday. The problem occurs constantly. The problem has been gradually worsening. Pertinent negatives include no abdominal pain, chest pain, chills, congestion, fatigue, fever, headaches, nausea, neck pain, numbness, vomiting or weakness. The symptoms are aggravated by walking. He has tried nothing for the symptoms.   Past Medical History  Diagnosis Date  . ERECTILE DYSFUNCTION 10/29/2010  . Chronic headaches 10/29/2010  . HYPERLIPIDEMIA 10/29/2010  . OSTEOARTHRITIS 10/29/2010  . DEPRESSION 10/29/2010    Dr Dub Mikes  . IBS (irritable bowel syndrome)   . GERD (gastroesophageal reflux disease)   . Anxiety   . Hypertension   . Eosinophilic esophagitis   . Diabetes mellitus   . Hypertension    Past Surgical History  Procedure Laterality Date  . Cervical fusion    . Cervical laminectomy  2012    Dr. Yevette Edwards  . Lumbar laminectomy  2007  . Lumbar fusion  2006  . Nasal sinus surgery    . Spinal cord stimulator implant  2011   Family History  Problem Relation Age of Onset  . Alcohol abuse Father   . Hypertension Father   . Hyperlipidemia Father   . Prostate  cancer Father   . Arthritis Father   . Diabetes Father   . Arthritis Mother   . Diabetes Mother   . Hypertension Mother   . Stroke Paternal Grandmother   . Heart disease Paternal Grandmother   . Colon cancer Maternal Grandfather 98  . Heart disease Paternal Uncle     x 2  . Colon cancer Cousin 48  . Esophageal cancer Paternal Uncle   . Stroke Maternal Grandmother   . Prostate cancer Paternal Uncle     x 4  . Prostate cancer Cousin     x 2   History  Substance Use Topics  . Smoking status: Never Smoker   . Smokeless tobacco: Never Used  . Alcohol Use: No    Review of Systems  Constitutional: Negative for fever, chills and fatigue.  HENT: Negative for congestion, rhinorrhea and neck pain.   Respiratory: Negative for chest tightness and shortness of breath.   Cardiovascular: Negative for chest pain and leg swelling.  Gastrointestinal: Negative for nausea, vomiting, abdominal pain and diarrhea.  Genitourinary: Negative for dysuria.  Neurological: Negative for dizziness, weakness, numbness and headaches.  All other systems reviewed and are negative.    Allergies  Bystolic  Home Medications   Current Outpatient Rx  Name  Route  Sig  Dispense  Refill  . amitriptyline (ELAVIL) 100 MG tablet   Oral   Take 1 tablet (100 mg total) by mouth at bedtime.   30 tablet   0   . ARIPiprazole (ABILIFY) 5 MG tablet   Oral  Take 1 tablet (5 mg total) by mouth daily.   30 tablet   0   . clonazePAM (KLONOPIN) 1 MG tablet   Oral   Take 1 tablet (1 mg total) by mouth 2 (two) times daily.   60 tablet   0   . dicyclomine (BENTYL) 10 MG capsule   Oral   Take 10 mg by mouth daily as needed. Bowls         . DULoxetine (CYMBALTA) 60 MG capsule   Oral   Take 1 capsule (60 mg total) by mouth daily.   30 capsule   0   . fluticasone (FLOVENT HFA) 220 MCG/ACT inhaler      2 puffs swallowed  Twice daily.   1 Inhaler   12   . gabapentin (NEURONTIN) 800 MG tablet   Oral    Take 1 tablet (800 mg total) by mouth 3 (three) times daily.   90 tablet   12   . Olmesartan-Amlodipine-HCTZ (TRIBENZOR) 40-5-25 MG TABS   Oral   Take 1 tablet by mouth daily.   70 tablet   0   . omega-3 acid ethyl esters (LOVAZA) 1 G capsule   Oral   Take 2 g by mouth 2 (two) times daily.         Marland Kitchen oxycodone (ROXICODONE) 30 MG immediate release tablet   Oral   Take 30 mg by mouth 4 (four) times daily.          . pioglitazone (ACTOS) 45 MG tablet   Oral   Take 45 mg by mouth daily. 1/2 pill daily         . rosuvastatin (CRESTOR) 40 MG tablet   Oral   Take 40 mg by mouth daily.         . sitaGLIPtan-metformin (JANUMET) 50-1000 MG per tablet   Oral   Take 1 tablet by mouth 2 (two) times daily with a meal.         . tadalafil (CIALIS) 5 MG tablet   Oral   Take 1 tablet (5 mg total) by mouth daily as needed for erectile dysfunction.   30 tablet   11   . EXPIRED: RABEprazole (ACIPHEX) 20 MG tablet   Oral   Take 1 tablet (20 mg total) by mouth daily.   30 tablet   11     Take 1 capsule 30 min before breakfast or first me ...    BP 93/68  Pulse 108  Temp(Src) 98 F (36.7 C) (Oral)  Resp 12  SpO2 100% Physical Exam  Nursing note and vitals reviewed. Constitutional: He is oriented to person, place, and time. He appears well-developed and well-nourished. No distress.  HENT:  Head: Normocephalic and atraumatic.  Right Ear: External ear normal.  Left Ear: External ear normal.  Mouth/Throat: Oropharynx is clear and moist.  Eyes: Pupils are equal, round, and reactive to light.  Neck: Normal range of motion. Neck supple.  Cardiovascular: Normal rate, regular rhythm, normal heart sounds and intact distal pulses.  Exam reveals no gallop and no friction rub.   No murmur heard. Pulmonary/Chest: Effort normal and breath sounds normal. No respiratory distress. He has no wheezes. He has no rales.  Abdominal: Soft. There is no tenderness. There is no rebound and no  guarding.  Musculoskeletal: Normal range of motion. He exhibits tenderness.  Slight swelling of dorsum of right foot with TTP. Able to move toes. Strong doppler DP pulse bilaterally. Light touch sensation in tact. Compartments  soft.   Lymphadenopathy:    He has no cervical adenopathy.  Neurological: He is alert and oriented to person, place, and time.  Skin: Skin is warm and dry. No rash noted. No erythema.  Psychiatric: He has a normal mood and affect. His behavior is normal.    ED Course  Procedures (including critical care time) Labs Reviewed  GLUCOSE, CAPILLARY - Abnormal; Notable for the following:    Glucose-Capillary 122 (*)    All other components within normal limits  BASIC METABOLIC PANEL - Abnormal; Notable for the following:    Glucose, Bld 114 (*)    BUN 24 (*)    Creatinine, Ser 2.31 (*)    GFR calc non Af Amer 31 (*)    GFR calc Af Amer 36 (*)    All other components within normal limits  CBC - Abnormal; Notable for the following:    RBC 3.95 (*)    Hemoglobin 10.8 (*)    HCT 33.5 (*)    All other components within normal limits  URINALYSIS, ROUTINE W REFLEX MICROSCOPIC  HEPATIC FUNCTION PANEL  POCT I-STAT TROPONIN I   Dg Foot Complete Left  02/02/2013   *RADIOLOGY REPORT*  Clinical Data: Post fall, now with pain involving the mid and superior aspect of the left foot  LEFT FOOT - COMPLETE 3+ VIEW  Comparison: None.  Findings: No definite fracture or dislocation.  Joint spaces appear preserved.  No definite erosions.  Regional soft tissues appear normal.  No radiopaque foreign body.  IMPRESSION: No fracture or radiopaque foreign body.   Original Report Authenticated By: Tacey Ruiz, MD   Dg Foot Complete Right  02/02/2013   *RADIOLOGY REPORT*  Clinical Data: Post fall, now with diffuse foot pain and swelling  RIGHT FOOT COMPLETE - 3+ VIEW  Comparison: None.  Findings:  There is apparent mild soft tissue swelling about the forefoot.  A crescentic lucency with  possible associated cortical step off involving the base of the fourth metatarsal is seen only on the provided oblique radiograph of the foot.  No dislocation.  Joint spaces are preserved.  No definite erosions.  No radiopaque foreign body.  IMPRESSION:  Mild diffuse soft tissue swelling about the forefoot with possible minimally displaced fracture involving the base of the fourth metatarsal, possibly artifactual secondary to overlying adjacent osseous structures.  Correlation for point tenderness at this location is recommended.   Original Report Authenticated By: Tacey Ruiz, MD   Ultrasound-guided peripheral IV: Nurses unable to establish adequate IV access. 20-gauge peripheral IV placed in right basilic vein under ultrasound guidance by myself with return of dark red, nonpulsatile blood. IV flushed and her back well. Patient tolerated procedure well with no complication.  Ultrasound guided peripheral IV: IV and right arm blue and nurses requiring additional IV access. 20-gauge peripheral IV placed and was basilic vein under ultrasound guidance with return of dark red, nonpulsatile blood. IV flushed and drew back well. Patient tolerated procedure well with no complication.  Date: 02/02/2013  Rate: 121  Rhythm: sinus tachycardia  QRS Axis: normal  Intervals: normal  ST/T Wave abnormalities: normal  Conduction Disutrbances:none  Narrative Interpretation:   Old EKG Reviewed: changes noted   1. Fall at home, initial encounter   2. Fracture of 4th metatarsal, right, closed, initial encounter   3. Acute renal failure   4. BPH (benign prostatic hyperplasia)   5. Chronic back pain   6. Depressive disorder, not elsewhere classified   7. Syncope  8. Type II or unspecified type diabetes mellitus without mention of complication, uncontrolled   9. Foot fracture, right, open, initial encounter     MDM  81:2 PM 52 year old male with a history of hyperlipidemia, irritable bowel syndrome and  hypertension and diabetes presenting with right foot pain and fall yesterday. Patient was walking in his kitchen when he thinks he may have slipped on a piece of plastic paper, but overall is unsure exactly what happened. Since the fall has had progressively worsening right foot pain and swelling. He denies abdominal pain, nausea, vomiting, chest pain, shortness of breath, lightheadedness or dizziness. He does have slight swelling of the dorsum of his right foot with tenderness. He is NV intact. X-ray shows probable fracture of the fourth metatarsal head. Patient noted to be in acute renal failure, with creatinine 2.3. Unsure of cause at this time. We'll give fluids.  5:30PM: Patient noted to be in acute renal failure. Hemoglobin down 2 grams from February, currently around 10. X-ray shows fracture of the fourth metatarsal head but no other evidence of trauma. Splint applied and patient will be nonweightbearing. It is still unclear if the patient had a mechanical fall versus syncope. He does state that he slipped on a piece of paper in the kitchen, but overall has poor recollection of the fall. Given questionable syncope with acute renal failure medicine consulted and patient admitted for further management.  Caren Hazy, MD 02/03/13 [phone]

## 2013-02-02 NOTE — ED Notes (Signed)
Pt was packing to move yesterday and states that he fell and now with pain with bilateral feet and has swelling to right foot.  Pt has dark line going up right foot and pulse is present.  Pt has reported increased urination

## 2013-02-02 NOTE — ED Notes (Signed)
MD at bedside. 

## 2013-02-02 NOTE — ED Notes (Signed)
Crutches discontinues as pt is being admitted.

## 2013-02-02 NOTE — ED Notes (Signed)
2 RN's attempted to start IVs, IV team called.

## 2013-02-02 NOTE — ED Provider Notes (Signed)
Pt not in room. 1553  Hurman Horn, MD 02/02/13 [phone]

## 2013-02-02 NOTE — ED Notes (Signed)
MD informed that pt's IV infiltrated.

## 2013-02-02 NOTE — H&P (Signed)
History and Physical       Hospital Admission Note Date: 02/02/2013  Patient name: Dennis Conley Medical record number: [id number] Date of birth: [birth year] Age: 52 y.o. Gender: male PCP: Thayer Headings, MD    Chief Complaint:  Loss of consciousness with right foot pain yesterday  HPI: Patient is a 52 year old male with multiple medical problems including chronic back pain, GERD, hypertension, hyperlipidemia, diabetes mellitus on oral hypoglycemics, anxiety had a questionable syncopal episode at home yesterday. History was obtained from the patient and his wife in the room. Per patient he was walking in the kitchen when he fell, he could not  distinctly recall if he had a syncopal episode. He thinks he may have fallen on the plastic paper on the floor of the kitchen. He denied any dizziness or lightheadedness, chest pain or any palpitations. Patient's wife who was in the house heard a noise in found him on the floor of the kitchen. Patient was unable to get up by himself and noticed that his right foot was hurting. The foot pain continued to get worse and and swollen and patient came to the ED for evaluation. In the ED, patient was noticed to have borderline soft BP, acute renal insufficiency, creatinine of 2.31. (Creatinine in 2/14 was 1.5). Right foot x-ray showed mild diffuse soft tissue swelling with possible minimally displaced fracture involving the base of the fourth metatarsal possibly artifactual secondary to overlying adjacent osseous structure. EDP had already placed a splint to the foot.  Review of Systems:  Constitutional: Denies fever, chills, diaphoresis, poor appetite and fatigue.  HEENT: Denies photophobia, eye pain, redness, hearing loss, ear pain, congestion, sore throat, rhinorrhea, sneezing, mouth sores, trouble swallowing, neck pain, neck stiffness and tinnitus.   Respiratory: Denies SOB, DOE, cough, chest tightness,  and  wheezing.   Cardiovascular: Denies chest pain, palpitations and leg swelling.  Gastrointestinal: Denies nausea, vomiting, abdominal pain, diarrhea, constipation, blood in stool and abdominal distention.  Genitourinary: Denies dysuria, urgency, frequency, hematuria, flank pain and difficulty urinating.  Musculoskeletal: Please see history of present illness Skin: Denies pallor, rash and wound.  Neurological: Denies dizziness, seizures, weakness, light-headedness, numbness and headaches. ? Syncope Hematological: Denies adenopathy. Easy bruising, personal or family bleeding history  Psychiatric/Behavioral: Denies suicidal ideation, mood changes, confusion, nervousness, sleep disturbance and agitation  Past Medical History: Past Medical History  Diagnosis Date  . ERECTILE DYSFUNCTION 10/29/2010  . Chronic headaches 10/29/2010  . HYPERLIPIDEMIA 10/29/2010  . OSTEOARTHRITIS 10/29/2010  . DEPRESSION 10/29/2010    Dr Dub Mikes  . IBS (irritable bowel syndrome)   . GERD (gastroesophageal reflux disease)   . Anxiety   . Hypertension   . Eosinophilic esophagitis   . Diabetes mellitus   . Hypertension    Past Surgical History  Procedure Laterality Date  . Cervical fusion    . Cervical laminectomy  2012    Dr. Yevette Edwards  . Lumbar laminectomy  2007  . Lumbar fusion  2006  . Nasal sinus surgery    . Spinal cord stimulator implant  2011    Medications: Prior to Admission medications   Medication Sig Start Date End Date Taking? Authorizing Provider  amitriptyline (ELAVIL) 100 MG tablet Take 1 tablet (100 mg total) by mouth at bedtime. 01/13/13  Yes Cleotis Nipper, MD  ARIPiprazole (ABILIFY) 5 MG tablet Take 1 tablet (5 mg total) by mouth daily. 01/26/13 01/26/14 Yes Cleotis Nipper, MD  clonazePAM (KLONOPIN) 1 MG tablet Take 1 tablet (1 mg total) by mouth  2 (two) times daily. 01/13/13  Yes Cleotis Nipper, MD  dicyclomine (BENTYL) 10 MG capsule Take 10 mg by mouth daily as needed. Bowls 08/09/11  Yes Beverley Fiedler, MD  DULoxetine (CYMBALTA) 60 MG capsule Take 1 capsule (60 mg total) by mouth daily. 01/26/13  Yes Cleotis Nipper, MD  fluticasone (FLOVENT HFA) 220 MCG/ACT inhaler 2 puffs swallowed  Twice daily. 12/23/11  Yes Beverley Fiedler, MD  gabapentin (NEURONTIN) 800 MG tablet Take 1 tablet (800 mg total) by mouth 3 (three) times daily. 12/15/12  Yes Suanne Marker, MD  Olmesartan-Amlodipine-HCTZ (TRIBENZOR) 40-5-25 MG TABS Take 1 tablet by mouth daily. 08/10/12  Yes Etta Grandchild, MD  omega-3 acid ethyl esters (LOVAZA) 1 G capsule Take 2 g by mouth 2 (two) times daily. 09/02/11  Yes Etta Grandchild, MD  oxycodone (ROXICODONE) 30 MG immediate release tablet Take 30 mg by mouth 4 (four) times daily.  11/24/12  Yes Historical Provider, MD  pioglitazone (ACTOS) 45 MG tablet Take 45 mg by mouth daily. 1/2 pill daily 12/28/12  Yes Romero Belling, MD  rosuvastatin (CRESTOR) 40 MG tablet Take 40 mg by mouth daily.   Yes Historical Provider, MD  sitaGLIPtan-metformin (JANUMET) 50-1000 MG per tablet Take 1 tablet by mouth 2 (two) times daily with a meal.   Yes Historical Provider, MD  tadalafil (CIALIS) 5 MG tablet Take 1 tablet (5 mg total) by mouth daily as needed for erectile dysfunction. 05/12/12  Yes Etta Grandchild, MD  RABEprazole (ACIPHEX) 20 MG tablet Take 1 tablet (20 mg total) by mouth daily. 12/23/11 12/22/12  Beverley Fiedler, MD    Allergies:   Allergies  Allergen Reactions  . Bystolic (Nebivolol Hcl)     Makes him sweat too much    Social History:  reports that he has never smoked. He has never used smokeless tobacco. He reports that he does not drink alcohol or use illicit drugs. patient lives at home with his family and he ambulates without any assistance   Family History: Family History  Problem Relation Age of Onset  . Alcohol abuse Father   . Hypertension Father   . Hyperlipidemia Father   . Prostate cancer Father   . Arthritis Father   . Diabetes Father   . Arthritis Mother   . Diabetes  Mother   . Hypertension Mother   . Stroke Paternal Grandmother   . Heart disease Paternal Grandmother   . Colon cancer Maternal Grandfather 56  . Heart disease Paternal Uncle     x 2  . Colon cancer Cousin 48  . Esophageal cancer Paternal Uncle   . Stroke Maternal Grandmother   . Prostate cancer Paternal Uncle     x 4  . Prostate cancer Cousin     x 2    Physical Exam: Blood pressure 107/68, pulse 106, temperature 98 F (36.7 C), temperature source Oral, resp. rate 13, SpO2 94.00%. General: Alert, awake, oriented x3, in no acute distress. HEENT: normocephalic, atraumatic, anicteric sclera, pink conjunctiva, pupils equal and reactive to light and accomodation, oropharynx clear Neck: supple, no masses or lymphadenopathy, no goiter, no bruits  Heart: Regular rate and rhythm, without murmurs, rubs or gallops. Lungs: Clear to auscultation bilaterally, no wheezing, rales or rhonchi. Abdomen: Soft, nontender, nondistended, positive bowel sounds, no masses. Extremities: No clubbing, cyanosis or edema with positive pedal pulses. Neuro: Grossly intact, no focal neurological deficits, strength 5/5 upper and lower extremities bilaterally Psych: alert and oriented  x 3, normal mood and affect Skin: no rashes or lesions, warm and dry   LABS on Admission:  Basic Metabolic Panel:  Recent Labs Lab 02/02/13 1500  NA 140  K 4.1  CL 101  CO2 29  GLUCOSE 114*  BUN 24*  CREATININE 2.31*  CALCIUM 9.8   Liver Function Tests:  Recent Labs Lab 02/02/13 1652  AST 23  ALT 36  ALKPHOS 95  BILITOT 0.2*  PROT 7.9  ALBUMIN 4.1   CBC:  Recent Labs Lab 02/02/13 1544  WBC 5.8  HGB 10.8*  HCT 33.5*  MCV 84.8  PLT 174   CBG:  Recent Labs Lab 02/02/13 1440  GLUCAP 122*     Radiological Exams on Admission: Dg Foot Complete Left  02/02/2013   *RADIOLOGY REPORT*  Clinical Data: Post fall, now with pain involving the mid and superior aspect of the left foot  LEFT FOOT - COMPLETE  3+ VIEW  Comparison: None.  Findings: No definite fracture or dislocation.  Joint spaces appear preserved.  No definite erosions.  Regional soft tissues appear normal.  No radiopaque foreign body.  IMPRESSION: No fracture or radiopaque foreign body.   Original Report Authenticated By: Tacey Ruiz, MD   Dg Foot Complete Right  02/02/2013   *RADIOLOGY REPORT*  Clinical Data: Post fall, now with diffuse foot pain and swelling  RIGHT FOOT COMPLETE - 3+ VIEW  Comparison: None.  Findings:  There is apparent mild soft tissue swelling about the forefoot.  A crescentic lucency with possible associated cortical step off involving the base of the fourth metatarsal is seen only on the provided oblique radiograph of the foot.  No dislocation.  Joint spaces are preserved.  No definite erosions.  No radiopaque foreign body.  IMPRESSION:  Mild diffuse soft tissue swelling about the forefoot with possible minimally displaced fracture involving the base of the fourth metatarsal, possibly artifactual secondary to overlying adjacent osseous structures.  Correlation for point tenderness at this location is recommended.   Original Report Authenticated By: Tacey Ruiz, MD    Assessment/Plan Principal Problem:   Syncope: patient unsure if he had mechanical fall vs syncopal episode - Admit to telemetry, obtain serial cardiac enzymes, obtain 2-D echocardiogram for further workup - Hold antihypertensives, obtain orthostatics, continue IV fluid hydration  Active Problems:   Pure hypercholesterolemia: -Continue statin     Type II or unspecified type diabetes mellitus without mention of complication, uncontrolled - Hold oral hypoglycemics, hold metformin until his creatinine function has normalized   - Obtain hemoglobin A1c, place on sliding scale insulin    Acute renal failure: Creatinine 2.31 ( 1.5 in 2/14) - Hold ACEI and HCTZ, metformin, NSAIDs - Placed on hydration, recheck BMET in a.m. if no significant improvement,  obtain renal ultrasound to rule out any obstruction     Foot fracture, right - Splint placed to the right foot by EDP - cont pain control, nonweightbearing, PT evaluation tomorrow. EDP had requested ortho consultation, will need follow-up.      HTN (hypertension) - Currently borderline soft BP, hold all antihypertensives, gentle hydration   DVT prophylaxis:  Lovenox   CODE STATUS:  Full code status   Further plan will depend as patient's clinical course evolves and further radiologic and laboratory data become available.   Time Spent on Admission: 1 hour  RAI,RIPUDEEP M.D. [facility] 02/02/2013, 6:02 PM Pager: [phone]  If 7PM-7AM, please contact night-coverage [email or web address] Password [email or web address]

## 2013-02-02 NOTE — ED Notes (Signed)
PT states his wife thought he passed out and pt only remembers pain

## 2013-02-02 NOTE — ED Notes (Signed)
Pt informed again to try to urinate.

## 2013-02-02 NOTE — Progress Notes (Signed)
Orthopedic Tech Progress Note Patient Details:  Dennis Conley Jun 24, 1961 [id number] Patient does not need crutches at this time  Ortho Devices Type of Ortho Device: Ace wrap;Post (short leg) splint Ortho Device/Splint Location: RLE Ortho Device/Splint Interventions: Ordered;Application   Jennye Moccasin 02/02/2013, 5:54 PM

## 2013-02-03 ENCOUNTER — Observation Stay (HOSPITAL_COMMUNITY): Payer: Medicare Other

## 2013-02-03 DIAGNOSIS — I517 Cardiomegaly: Secondary | ICD-10-CM

## 2013-02-03 LAB — CBC
MCH: 27.2 pg (ref 26.0–34.0)
MCHC: 32.2 g/dL (ref 30.0–36.0)
Platelets: 142 10*3/uL — ABNORMAL LOW (ref 150–400)
RDW: 13.1 % (ref 11.5–15.5)

## 2013-02-03 LAB — URINALYSIS, ROUTINE W REFLEX MICROSCOPIC
Bilirubin Urine: NEGATIVE
Hgb urine dipstick: NEGATIVE
Ketones, ur: NEGATIVE mg/dL
Specific Gravity, Urine: 1.017 (ref 1.005–1.030)
pH: 6 (ref 5.0–8.0)

## 2013-02-03 LAB — BASIC METABOLIC PANEL
BUN: 22 mg/dL (ref 6–23)
Calcium: 8.9 mg/dL (ref 8.4–10.5)
Creatinine, Ser: 2.09 mg/dL — ABNORMAL HIGH (ref 0.50–1.35)
GFR calc Af Amer: 41 mL/min — ABNORMAL LOW (ref >90)
GFR calc non Af Amer: 35 mL/min — ABNORMAL LOW (ref >90)
Glucose, Bld: 99 mg/dL (ref 70–99)

## 2013-02-03 LAB — URINE MICROSCOPIC-ADD ON

## 2013-02-03 LAB — GLUCOSE, CAPILLARY
Glucose-Capillary: 75 mg/dL (ref 70–99)
Glucose-Capillary: 112 mg/dL — ABNORMAL HIGH (ref 70–99)

## 2013-02-03 LAB — RETICULOCYTES
Retic Count, Absolute: 27 10*3/uL (ref 19.0–186.0)
Retic Ct Pct: 0.7 % (ref 0.4–3.1)

## 2013-02-03 LAB — IRON AND TIBC: Iron: 43 ug/dL (ref 42–135)

## 2013-02-03 LAB — TSH: TSH: 2.191 u[IU]/mL (ref 0.350–4.500)

## 2013-02-03 LAB — CK TOTAL AND CKMB (NOT AT ARMC): Relative Index: 1.2 (ref 0.0–2.5)

## 2013-02-03 LAB — VITAMIN B12: Vitamin B-12: 361 pg/mL (ref 211–911)

## 2013-02-03 LAB — CORTISOL: Cortisol, Plasma: 4 ug/dL

## 2013-02-03 LAB — FERRITIN: Ferritin: 97 ng/mL (ref 22–322)

## 2013-02-03 LAB — HEMOGLOBIN A1C: Hgb A1c MFr Bld: 5.8 % — ABNORMAL HIGH (ref <5.7)

## 2013-02-03 MED ORDER — OXYCODONE HCL 5 MG PO TABS
30.0000 mg | ORAL_TABLET | Freq: Four times a day (QID) | ORAL | Status: DC | PRN
  Administered 2013-02-03 – 2013-02-05 (×7): 30 mg via ORAL
  Filled 2013-02-03 (×7): qty 6

## 2013-02-03 MED ORDER — OXYCODONE HCL 5 MG PO TABS: 30.0000 mg | ORAL_TABLET | Freq: Four times a day (QID) | ORAL | Status: DC | PRN

## 2013-02-03 MED ORDER — ENOXAPARIN SODIUM 40 MG/0.4ML ~~LOC~~ SOLN
40.0000 mg | SUBCUTANEOUS | Status: DC
  Administered 2013-02-03 – 2013-02-04 (×2): 40 mg via SUBCUTANEOUS
  Filled 2013-02-03 (×4): qty 0.4

## 2013-02-03 MED ORDER — DEXTROSE-NACL 5-0.9 % IV SOLN
INTRAVENOUS | Status: DC
  Administered 2013-02-03 – 2013-02-04 (×2): via INTRAVENOUS

## 2013-02-03 NOTE — Progress Notes (Signed)
Hypoglycemic Event  CBG: 61  Treatment: 15 GM carbohydrate snack  Symptoms: None  Follow-up CBG: Time:1318 CBG Result:71  Possible Reasons for Event: Unknown  Comments/MD notified: Dr. Aquilla Hacker, Macie Burows  Remember to initiate Hypoglycemia Order Set & complete

## 2013-02-03 NOTE — Progress Notes (Signed)
  Echocardiogram 2D Echocardiogram has been performed.  Cathie Beams 02/03/2013, 12:13 PM

## 2013-02-03 NOTE — Progress Notes (Addendum)
[facility] PROGRESS NOTE  Dennis Conley ZOX:[id number] DOB: [birth year] DOA: 02/02/2013 PCP: Thayer Headings, MD  Assessment/Plan: Principal Problem:   Syncope Active Problems:   Pure hypercholesterolemia   Type II or unspecified type diabetes mellitus without mention of complication, uncontrolled   Acute renal failure   Foot fracture, right   HTN (hypertension)    Syncope: patient unsure if he had mechanical fall vs syncopal episode  - Admit to telemetry, obtain serial cardiac enzymes, obtain 2-D echocardiogram for further workup  Cardiac enzymes negative far - Hold antihypertensives, obtain orthostatics, continue IV fluid hydration  On multiple sedating medications, will advise the patient to minimize narcotics   anemia  Baseline hg of 10.8 Anemia panel, ACD?   Pure hypercholesterolemia:  -Continue statin   Type II or unspecified type diabetes mellitus without mention of complication, uncontrolled  - Hold oral hypoglycemics, hold metformin until his creatinine function has normalized  - Obtain hemoglobin A1c, place on sliding scale insulin    Acute renal failure: Creatinine 2.31 ( 1.5 in 2/14)  - Hold ACEI and HCTZ, metformin, NSAIDs  - Placed on hydration, recheck BMET in a.m.  Renal ultrasound   Foot fracture, right  - Splint placed to the right foot by EDP  - cont pain control, nonweightbearing, PT evaluation tomorrow. EDP had requested ortho consultation, will need follow-up.    HTN (hypertension)  - Currently borderline soft BP, hold all antihypertensives, gentle hydration      Code Status: full Family Communication: family updated about patient's clinical progress Disposition Plan:  As above    Brief narrative: Patient is a 52 year old male with multiple medical problems including chronic back pain, GERD, hypertension, hyperlipidemia, diabetes mellitus on oral hypoglycemics, anxiety had a questionable syncopal episode at home yesterday. History  was obtained from the patient and his wife in the room. Per patient he was walking in the kitchen when he fell, he could not distinctly recall if he had a syncopal episode. He thinks he may have fallen on the plastic paper on the floor of the kitchen. He denied any dizziness or lightheadedness, chest pain or any palpitations. Patient's wife who was in the house heard a noise in found him on the floor of the kitchen. Patient was unable to get up by himself and noticed that his right foot was hurting. The foot pain continued to get worse and and swollen and patient came to the ED for evaluation.  In the ED, patient was noticed to have borderline soft BP, acute renal insufficiency, creatinine of 2.31. (Creatinine in 2/14 was 1.5). Right foot x-ray showed mild diffuse soft tissue swelling with possible minimally displaced fracture involving the base of the fourth metatarsal possibly artifactual secondary to overlying adjacent osseous structure. EDP had already placed a splint to the foot.   Consultants: Stable  Procedures:   Antibiotics:  None  HPI/Subjective: Stable overnight  Objective: Filed Vitals:   02/03/13 0600 02/03/13 0601 02/03/13 0603 02/03/13 0606  BP: 100/73 112/75 101/70 117/76  Pulse: 93 95 99 99  Temp: 97.7 F (36.5 C)     TempSrc: Oral     Resp: 18     Height:      Weight:    102 kg (224 lb 13.9 oz)  SpO2: 100% 100%      Intake/Output Summary (Last 24 hours) at 02/03/13 0834 Last data filed at 02/03/13 0600  Gross per 24 hour  Intake    900 ml  Output   1000 ml  Net   -100 ml    Exam:  General: Alert, awake, oriented x3, in no acute distress.  HEENT: normocephalic, atraumatic, anicteric sclera, pink conjunctiva, pupils equal and reactive to light and accomodation, oropharynx clear  Neck: supple, no masses or lymphadenopathy, no goiter, no bruits  Heart: Regular rate and rhythm, without murmurs, rubs or gallops.  Lungs: Clear to auscultation bilaterally, no  wheezing, rales or rhonchi.  Abdomen: Soft, nontender, nondistended, positive bowel sounds, no masses.  Extremities: No clubbing, cyanosis or edema with positive pedal pulses.  Neuro: Grossly intact, no focal neurological deficits, strength 5/5 upper and lower extremities bilaterally  Psych: alert and oriented x 3, normal mood and affect  Skin: no rashes or lesions, warm and dry       Data Reviewed: Basic Metabolic Panel:  Recent Labs Lab 02/02/13 1500 02/03/13 0418  NA 140 140  K 4.1 4.0  CL 101 102  CO2 29 28  GLUCOSE 114* 99  BUN 24* 22  CREATININE 2.31* 2.09*  CALCIUM 9.8 8.9    Liver Function Tests:  Recent Labs Lab 02/02/13 1652  AST 23  ALT 36  ALKPHOS 95  BILITOT 0.2*  PROT 7.9  ALBUMIN 4.1   No results found for this basename: LIPASE, AMYLASE,  in the last 168 hours No results found for this basename: AMMONIA,  in the last 168 hours  CBC:  Recent Labs Lab 02/02/13 1544 02/03/13 0418  WBC 5.8 4.9  HGB 10.8* 9.4*  HCT 33.5* 29.2*  MCV 84.8 84.6  PLT 174 142*    Cardiac Enzymes:  Recent Labs Lab 02/02/13 2205 02/03/13 0418  CKTOTAL 259* 211  CKMB 3.1 2.6  TROPONINI <0.30 <0.30   BNP (last 3 results)  Recent Labs  09/14/12 0906  PROBNP 3.0     CBG:  Recent Labs Lab 02/02/13 1440 02/02/13 2123 02/03/13 0751 02/03/13 0825  GLUCAP 122* 114* 59* 93    No results found for this or any previous visit (from the past 240 hour(s)).   Studies: Dg Foot Complete Left  02/02/2013   *RADIOLOGY REPORT*  Clinical Data: Post fall, now with pain involving the mid and superior aspect of the left foot  LEFT FOOT - COMPLETE 3+ VIEW  Comparison: None.  Findings: No definite fracture or dislocation.  Joint spaces appear preserved.  No definite erosions.  Regional soft tissues appear normal.  No radiopaque foreign body.  IMPRESSION: No fracture or radiopaque foreign body.   Original Report Authenticated By: Tacey Ruiz, MD   Dg Foot Complete  Right  02/02/2013   *RADIOLOGY REPORT*  Clinical Data: Post fall, now with diffuse foot pain and swelling  RIGHT FOOT COMPLETE - 3+ VIEW  Comparison: None.  Findings:  There is apparent mild soft tissue swelling about the forefoot.  A crescentic lucency with possible associated cortical step off involving the base of the fourth metatarsal is seen only on the provided oblique radiograph of the foot.  No dislocation.  Joint spaces are preserved.  No definite erosions.  No radiopaque foreign body.  IMPRESSION:  Mild diffuse soft tissue swelling about the forefoot with possible minimally displaced fracture involving the base of the fourth metatarsal, possibly artifactual secondary to overlying adjacent osseous structures.  Correlation for point tenderness at this location is recommended.   Original Report Authenticated By: Tacey Ruiz, MD    Scheduled Meds: . sodium chloride   Intravenous STAT  . amitriptyline  100 mg Oral QHS  . ARIPiprazole  5 mg Oral Daily  . atorvastatin  80 mg Oral q1800  . clonazePAM  1 mg Oral BID  . DULoxetine  60 mg Oral Daily  . enoxaparin (LOVENOX) injection  30 mg Subcutaneous Q24H  . gabapentin  800 mg Oral TID  . insulin aspart  0-5 Units Subcutaneous QHS  . insulin aspart  0-9 Units Subcutaneous TID WC  . omega-3 acid ethyl esters  2 g Oral BID  . oxycodone  30 mg Oral QID  . pantoprazole  40 mg Oral Daily  . sodium chloride  3 mL Intravenous Q12H   Continuous Infusions: . sodium chloride    . sodium chloride 125 mL/hr at 02/02/13 2219    Principal Problem:   Syncope Active Problems:   Pure hypercholesterolemia   Type II or unspecified type diabetes mellitus without mention of complication, uncontrolled   Acute renal failure   Foot fracture, right   HTN (hypertension)    Time spent: 40 minutes   [employer]  [facility] Pager [phone]. If 8PM-8AM, please contact night-coverage at [email or web address], password [facility] 02/03/2013, 8:34 AM  LOS: 1 day

## 2013-02-03 NOTE — Progress Notes (Signed)
Hypoglycemic Event  CBG: 59  Treatment: 15 GM carbohydrate snack  Symptoms: None  Follow-up CBG: [id number] CBG Result:93  Possible Reasons for Event: Unknown  Comments/MD notified:Abrol    Efraim Kaufmann  Remember to initiate Hypoglycemia Order Set & complete

## 2013-02-03 NOTE — Evaluation (Addendum)
Occupational Therapy Evaluation Patient Details Name: Dennis Conley MRN: [id number] DOB: 07-Jul-1961 Today's Date: 02/03/2013 Time: 1478-2956 OT Time Calculation (min): 35 min  OT Assessment / Plan / Recommendation History of present illness  questionable syncopal episode   Clinical Impression   Pt admitted s/p fall at home (questionable syncopal episode). Right foot x-ray showed mild diffuse soft tissue swelling with possible minimally displaced fx involving base of fourth metatarsal possibly artifactual secondary to overlying adjacent osseous structure.  Will continue to follow acutely in order to address below problem list in prep for safe return home.    OT Assessment  Patient needs continued OT Services    Follow Up Recommendations  No OT follow up;Supervision/Assistance - 24 hour    Barriers to Discharge      Equipment Recommendations  Tub/shower bench;Tub/shower seat (seat vs bench to be determined next session)    Recommendations for Other Services    Frequency  Min 2X/week    Precautions / Restrictions Precautions Precautions: Fall Precaution Comments: nwb right, pre-morbid left foot pain Required Braces or Orthoses: Other Brace/Splint (short leg splint applied to right leg) Restrictions Weight Bearing Restrictions: Yes RLE Weight Bearing: Non weight bearing Other Position/Activity Restrictions: recommend elevate right foot   Pertinent Vitals/Pain See vitals    ADL  Eating/Feeding: Performed;Independent Where Assessed - Eating/Feeding: Chair Grooming: Performed;Wash/dry hands;Min guard Where Assessed - Grooming: Supported standing Upper Body Bathing: Simulated;Set up Where Assessed - Upper Body Bathing: Unsupported sitting Lower Body Bathing: Simulated;Min guard Where Assessed - Lower Body Bathing: Supported sit to stand Upper Body Dressing: Simulated;Set up Where Assessed - Upper Body Dressing: Unsupported sitting Lower Body Dressing: Performed;Minimal  assistance Where Assessed - Lower Body Dressing: Supported sit to stand Toilet Transfer: Simulated;Minimal assistance Toilet Transfer Method: Sit to Barista:  (bed) Equipment Used: Rolling walker;Gait belt Transfers/Ambulation Related to ADLs: min guard with RW. VCs for technique.    OT Diagnosis: Generalized weakness;Acute pain  OT Problem List: Decreased strength;Decreased activity tolerance;Impaired balance (sitting and/or standing);Decreased knowledge of use of DME or AE;Pain OT Treatment Interventions: Self-care/ADL training;DME and/or AE instruction;Therapeutic activities;Patient/family education;Balance training   OT Goals(Current goals can be found in the care plan section) Acute Rehab OT Goals Patient Stated Goal: No more falls OT Goal Formulation: With patient Time For Goal Achievement: 02/10/13 Potential to Achieve Goals: Good  Visit Information  Last OT Received On: 02/03/13 Assistance Needed: +1 PT/OT Co-Evaluation/Treatment: Yes       Prior Functioning     Home Living Family/patient expects to be discharged to:: Private residence Living Arrangements: Spouse/significant other;Children Available Help at Discharge: Family;Available 24 hours/day Type of Home: House Home Access: Stairs to enter [employer] of Steps: 3 Entrance Stairs-Rails: Left Home Layout: Two level;Able to live on main level with bedroom/bathroom Home Equipment: Gilmer Mor - single point Additional Comments: has 5 children under 58 yo, states they are able to help him Prior Function Level of Independence: Needs assistance ADL's / Homemaking Assistance Needed: Occasional min assist for LB ADLS. Comments: Hx of falls over past 6 months. -- s/s suggest vestibular component, see balance assessment below Communication Communication: No difficulties         Vision/Perception     Cognition  Cognition Arousal/Alertness: Awake/alert Behavior During Therapy: WFL  for tasks assessed/performed Overall Cognitive Status: Within Functional Limits for tasks assessed    Extremity/Trunk Assessment Upper Extremity Assessment Upper Extremity Assessment: Overall WFL for tasks assessed Lower Extremity Assessment Lower Extremity Assessment: RLE deficits/detail RLE  Deficits / Details: NWB, short leg splint. awaiting Ortho c/s for updated activity orders... RLE: Unable to fully assess due to immobilization     Mobility Bed Mobility Bed Mobility: Supine to Sit Supine to Sit: 5: Supervision Details for Bed Mobility Assistance: supervision for safety Transfers Transfers: Sit to Stand;Stand to Sit Sit to Stand: 4: Min assist;From bed;With upper extremity assist Stand to Sit: 4: Min assist;To chair/3-in-1;With armrests;With upper extremity assist Details for Transfer Assistance: VCs for safe hand placement and technique. Assist for steadying/balance when transitioning RUE from bed to RW.     Exercise     Balance Balance Balance Assessed: Yes Standardized Balance Assessment Standardized Balance Assessment: Vestibular Evaluation High Level Balance High Level Balance Comments: Evaluated for vestibular function -- seated slow and quick VOR both negative this assessment, pt denies vestibular symptoms; modified sidelying test negative bilaterally and denies symptoms.  see Session Comments   End of Session OT - End of Session Equipment Utilized During Treatment: Gait belt;Rolling walker Activity Tolerance: Patient tolerated treatment well Patient left: in chair;with call bell/phone within reach Nurse Communication: Mobility status  GO Functional Assessment Tool Used: clinical judgement Functional Limitation: Self care Self Care Current Status (W0981): At least 1 percent but less than 20 percent impaired, limited or restricted Self Care Goal Status [phone]): 0 percent impaired, limited or restricted  02/03/2013 Cipriano Mile OTR/L Pager [phone]  Office [phone]  Cipriano Mile 02/03/2013, 3:31 PM

## 2013-02-03 NOTE — Progress Notes (Signed)
Utilization review completed.  

## 2013-02-03 NOTE — Evaluation (Signed)
Physical Therapy Evaluation Patient Details Name: Dennis Conley MRN: [id number] DOB: 10-01-1960 Today's Date: 02/03/2013 Time: 1340-1420 PT Time Calculation (min): 40 min  PT Assessment / Plan / Recommendation History of Present Illness    Patient is a 52 year old male with multiple medical problems including chronic back pain, hypertension, , diabetes mellitus on oral hypoglycemics, anxiety had a questionable syncopal episode at home yesterday.  He denied any dizziness or lightheadedness, chest pain or any palpitations.  Patient was unable to get up by himself and noticed that his right foot was hurting.  Right foot x-ray showed mild diffuse soft tissue swelling with possible minimally displaced fracture involving the base of the fourth metatarsal possibly artifactual secondary to overlying adjacent osseous structure. EDP had already placed a splint to the foot.  Clinical Impression  Presents with moderate limitations to functional mobility due to pain and non-weight bearing status.  History of frequent falls over past 6 months characterized by 'just fell over', dysequilibrium vs. dizziness and question vestibular involvement.  Screening negative for positional testing and slow/quick VOR, however pt reports episode of hearing loss left ear a few months ago that lasted 3-4 days then resolved.  Recommend: 1) referral to ENT to assess vestibular and auditory function; 2) acute PT to assess safest ambulatory aide RW v. Crutches; 3) possible OPPT dependent on medical workup and determination of vestibular function; 4) Clarification on weight bearing status from orthopedic MD, including recommendation for walking boot IF pt able to weight bear.  See PT plan below.    PT Assessment  Patient needs continued PT services    Follow Up Recommendations  Home health PT;Supervision - Intermittent    Does the patient have the potential to tolerate intense rehabilitation      Barriers to Discharge  Inaccessible home environment (3 steps to enter... will set goal to resolve)      Equipment Recommendations  Rolling walker with 5" wheels;Crutches    Recommendations for Other Services   ENT Referral  Frequency Min 4X/week    Precautions / Restrictions Precautions Precautions: Fall Precaution Comments: nwb right, pre-morbid left foot pain Required Braces or Orthoses: Other Brace/Splint (short leg splint applied to right leg) Restrictions Weight Bearing Restrictions: Yes RLE Weight Bearing: Non weight bearing Other Position/Activity Restrictions: recommend elevate right foot   Pertinent Vitals/Pain Throbbing in foot in dependent position      Mobility  Bed Mobility Bed Mobility: Supine to Sit Supine to Sit: 5: Supervision Details for Bed Mobility Assistance: observed for safety and protection of right leg Transfers Transfers: Sit to Stand;Stand to Sit Sit to Stand: 4: Min assist;With upper extremity assist;From bed Stand to Sit: 4: Min assist;With upper extremity assist;To chair/3-in-1 Details for Transfer Assistance: verbal instruction for best technique to maintain nwb right and transition from bed to RW, RW to lower chair surface, pt able to follow instruction with minimal cueing.   Ambulation/Gait Ambulation/Gait Assistance: 4: Min guard Ambulation Distance (Feet): 25 Feet Assistive device: Rolling walker (has a bariatric walker in room, recommend TALL nonbari) Ambulation/Gait Assistance Details: verbal and visual instruction on safest techniqe to use RW effectively to maintain NWB and minimize shoulder/neck strain Gait Pattern:  (NON WEIGHT BEARING RIGHT) General Gait Details: Fatigues after 20 feet, demonstrates hand discomfort on RW grips, needs cues to keep shoulders low, and encouragement initially using walker Stairs: No Wheelchair Mobility Wheelchair Mobility: No    Exercises     PT Diagnosis: Difficulty walking;Acute pain  PT Problem List: Pain;Decreased  knowledge of use of DME;Decreased mobility;Decreased balance;Decreased activity tolerance PT Treatment Interventions: Modalities;Patient/family education;Balance training;Therapeutic activities;Functional mobility training;Stair training;Gait training;DME instruction     PT Goals(Current goals can be found in the care plan section) Acute Rehab PT Goals Patient Stated Goal: No more falls PT Goal Formulation: With patient Time For Goal Achievement: 02/17/13 Potential to Achieve Goals: Good  Visit Information  Last PT Received On: 02/03/13 Assistance Needed: +1 PT/OT Co-Evaluation/Treatment: Yes       Prior Functioning  Home Living Family/patient expects to be discharged to:: Private residence Living Arrangements: Spouse/significant other;Children Available Help at Discharge: Family;Available 24 hours/day Type of Home: House Home Access: Stairs to enter [employer] of Steps: 3 Entrance Stairs-Rails: Left Home Layout: Two level;Able to live on main level with bedroom/bathroom Home Equipment: Gilmer Mor - single point Additional Comments: has 5 children under 24 yo, states they are able to help him Prior Function Level of Independence: Needs assistance ADL's / Homemaking Assistance Needed: Occasional min assist for LB ADLS. Comments: Hx of falls over past 6 months. -- s/s suggest vestibular component, see balance assessment below Communication Communication: No difficulties    Cognition  Cognition Arousal/Alertness: Awake/alert Behavior During Therapy: WFL for tasks assessed/performed Overall Cognitive Status: Within Functional Limits for tasks assessed    Extremity/Trunk Assessment Upper Extremity Assessment Upper Extremity Assessment: Defer to OT evaluation Lower Extremity Assessment Lower Extremity Assessment: RLE deficits/detail RLE Deficits / Details: NWB, short leg splint. awaiting Ortho c/s for updated activity orders... RLE: Unable to fully assess due to  immobilization   Balance Balance Balance Assessed: Yes Standardized Balance Assessment Standardized Balance Assessment: Vestibular Evaluation High Level Balance High Level Balance Comments: Evaluated for vestibular function -- seated slow and quick VOR both negative this assessment, pt denies vestibular symptoms; modified sidelying test negative bilaterally and denies symptoms.  see Session Comments  End of Session PT - End of Session Equipment Utilized During Treatment: Gait belt Activity Tolerance: Patient tolerated treatment well;Patient limited by fatigue Patient left: in chair;with call bell/phone within reach (Right leg elevated) Nurse Communication: Mobility status  GP Functional Assessment Tool Used: clinical judgement Functional Limitation: Mobility: Walking and moving around Mobility: Walking and Moving Around Current Status (Z6109): At least 40 percent but less than 60 percent impaired, limited or restricted Mobility: Walking and Moving Around Goal Status [phone]): At least 1 percent but less than 20 percent impaired, limited or restricted   Dennis Bast 02/03/2013, 2:51 PM

## 2013-02-04 ENCOUNTER — Observation Stay (HOSPITAL_COMMUNITY): Payer: Medicare Other

## 2013-02-04 LAB — GLUCOSE, CAPILLARY
Glucose-Capillary: 94 mg/dL (ref 70–99)
Glucose-Capillary: 79 mg/dL (ref 70–99)
Glucose-Capillary: 160 mg/dL — ABNORMAL HIGH (ref 70–99)

## 2013-02-04 LAB — BASIC METABOLIC PANEL
BUN: 15 mg/dL (ref 6–23)
CO2: 30 mEq/L (ref 19–32)
Chloride: 105 mEq/L (ref 96–112)
GFR calc non Af Amer: 53 mL/min — ABNORMAL LOW (ref >90)
Glucose, Bld: 118 mg/dL — ABNORMAL HIGH (ref 70–99)
Potassium: 4.2 mEq/L (ref 3.5–5.1)
Sodium: 140 mEq/L (ref 135–145)

## 2013-02-04 LAB — URINE CULTURE
Colony Count: NO GROWTH
Culture: NO GROWTH

## 2013-02-04 MED ORDER — COSYNTROPIN 0.25 MG IJ SOLR
0.2500 mg | Freq: Once | INTRAMUSCULAR | Status: AC
  Administered 2013-02-04: 0.25 mg via INTRAVENOUS
  Filled 2013-02-04: qty 0.25

## 2013-02-04 NOTE — Progress Notes (Signed)
[facility] PROGRESS NOTE  Dennis Conley AVW:[id number] DOB: 1960-10-26 DOA: 02/02/2013 PCP: Thayer Headings, MD  Assessment/Plan: Principal Problem:   Syncope Active Problems:   Pure hypercholesterolemia   Type II or unspecified type diabetes mellitus without mention of complication, uncontrolled   Acute renal failure   Foot fracture, right   HTN (hypertension)     Syncope: Orthostatic syncope? Could also be related to hypoglycemia Cardiac enzymes negative  2-D echo shows an EF of 60-65% Blood pressure soft - Hold antihypertensives, obtain orthostatics, discontinued IV fluids On multiple sedating medications, will advise the patient to minimize narcotics   anemia  Baseline hg of 10.8  Anemia panel, ACD?   Hypoglycemia In the setting of oral hypoglycemics and renal insufficiency Will DC D5 and observe CBG today Cortisol level extremely low at 4.0 He will need outpatient endocrinology evaluation  Pure hypercholesterolemia:  -Continue statin    Type II or unspecified type diabetes mellitus without mention of complication, uncontrolled  - Hold oral hypoglycemics, hold metformin until his creatinine function has normalized  - Obtain hemoglobin A1c, place on sliding scale insulin   Acute renal failure: Creatinine 2.31 ( 1.5 in 2/14)  - Hold ACEI and HCTZ, metformin, NSAIDs  - Placed on hydration, creatinine improving Negative renal ultrasound   Foot fracture, right  - Splint placed to the right foot by EDP  According to Dr. Isidoro Donning, EDP had called orthopedics No orthopedic surgeon saw the patient yesterday We will repeat x-rays and reconsult Dr. Charlann Boxer today from [facility] orthopedics  HTN (hypertension)  - Currently borderline soft BP, hold all antihypertensives, gentle hydration    Code Status: full  Family Communication: family updated about patient's clinical progress  Disposition Plan: Likely discharge tomorrow, if CBG is stable   Brief narrative:   Patient is a 52 year old male with multiple medical problems including chronic back pain, GERD, hypertension, hyperlipidemia, diabetes mellitus on oral hypoglycemics, anxiety had a questionable syncopal episode at home yesterday. History was obtained from the patient and his wife in the room. Per patient he was walking in the kitchen when he fell, he could not distinctly recall if he had a syncopal episode. He thinks he may have fallen on the plastic paper on the floor of the kitchen. He denied any dizziness or lightheadedness, chest pain or any palpitations. Patient's wife who was in the house heard a noise in found him on the floor of the kitchen. Patient was unable to get up by himself and noticed that his right foot was hurting. The foot pain continued to get worse and and swollen and patient came to the ED for evaluation.  In the ED, patient was noticed to have borderline soft BP, acute renal insufficiency, creatinine of 2.31. (Creatinine in 2/14 was 1.5). Right foot x-ray showed mild diffuse soft tissue swelling with possible minimally displaced fracture involving the base of the fourth metatarsal possibly artifactual secondary to overlying adjacent osseous structure. EDP had already placed a splint to the foot.  Consultants:  Stable  Procedures:  Antibiotics:  None HPI/Subjective:  Stable overnight   Objective: Filed Vitals:   02/03/13 0606 02/03/13 1429 02/03/13 2107 02/04/13 0456  BP: 117/76 117/72 126/82 106/75  Pulse: 99 100 102 89  Temp:  97.9 F (36.6 C) 98.5 F (36.9 C) 98.5 F (36.9 C)  TempSrc:  Oral Oral Oral  Resp:  18 18 18   Height:      Weight: 102 kg (224 lb 13.9 oz)     SpO2:  96% 98% 95%    Intake/Output Summary (Last 24 hours) at 02/04/13 0834 Last data filed at 02/03/13 1429  Gross per 24 hour  Intake    300 ml  Output      1 ml  Net    299 ml    Exam: General: Alert, awake, oriented x3, in no acute distress.  HEENT: normocephalic, atraumatic, anicteric  sclera, pink conjunctiva, pupils equal and reactive to light and accomodation, oropharynx clear  Neck: supple, no masses or lymphadenopathy, no goiter, no bruits  Heart: Regular rate and rhythm, without murmurs, rubs or gallops.  Lungs: Clear to auscultation bilaterally, no wheezing, rales or rhonchi.  Abdomen: Soft, nontender, nondistended, positive bowel sounds, no masses.  Extremities: No clubbing, cyanosis or edema with positive pedal pulses.  Neuro: Grossly intact, no focal neurological deficits, strength 5/5 upper and lower extremities bilaterally  Psych: alert and oriented x 3, normal mood and affect  Skin: no rashes or lesions, warm and dry      Data Reviewed: Basic Metabolic Panel:  Recent Labs Lab 02/02/13 1500 02/03/13 0418 02/04/13 0555  NA 140 140 140  K 4.1 4.0 4.2  CL 101 102 105  CO2 29 28 30   GLUCOSE 114* 99 118*  BUN 24* 22 15  CREATININE 2.31* 2.09* 1.49*  CALCIUM 9.8 8.9 8.8    Liver Function Tests:  Recent Labs Lab 02/02/13 1652  AST 23  ALT 36  ALKPHOS 95  BILITOT 0.2*  PROT 7.9  ALBUMIN 4.1   No results found for this basename: LIPASE, AMYLASE,  in the last 168 hours No results found for this basename: AMMONIA,  in the last 168 hours  CBC:  Recent Labs Lab 02/02/13 1544 02/03/13 0418  WBC 5.8 4.9  HGB 10.8* 9.4*  HCT 33.5* 29.2*  MCV 84.8 84.6  PLT 174 142*    Cardiac Enzymes:  Recent Labs Lab 02/02/13 2205 02/03/13 0418  CKTOTAL 259* 211  CKMB 3.1 2.6  TROPONINI <0.30 <0.30   BNP (last 3 results)  Recent Labs  09/14/12 0906  PROBNP 3.0     CBG:  Recent Labs Lab 02/03/13 1230 02/03/13 1318 02/03/13 1635 02/03/13 2106 02/04/13 0802  GLUCAP 61* 75 112* 133* 94    Recent Results (from the past 240 hour(s))  URINE CULTURE     Status: None   Collection Time    02/03/13  6:36 AM      Result Value Range Status   Specimen Description URINE, CLEAN CATCH   Final   Special Requests NONE   Final   Culture   Setup Time 02/03/2013 10:10   Final   Colony Count NO GROWTH   Final   Culture NO GROWTH   Final   Report Status 02/04/2013 FINAL   Final     Studies: US Renal  02/03/2013   *RADIOLOGY REPORT*  Clinical Data: 52 year old male with renal failure.  Hypertension and diabetes.  RENAL/URINARY TRACT ULTRASOUND COMPLETE  Comparison:  CT lumbar myelogram 11/09/2010.  Findings:  Right Kidney:  No hydronephrosis.  Renal length 9.2 cm.  Cortical echotexture within normal limits.  Left Kidney:  No hydronephrosis.  Renal length 11.5 cm.  Cortical echotexture within normal limits.  Bladder:  Unremarkable.  IMPRESSION: Negative sonographic appearance of the kidneys and bladder.   Original Report Authenticated By: Erskine Speed, M.D.   Dg Foot Complete Left  02/02/2013   *RADIOLOGY REPORT*  Clinical Data: Post fall, now with pain involving the mid and  superior aspect of the left foot  LEFT FOOT - COMPLETE 3+ VIEW  Comparison: None.  Findings: No definite fracture or dislocation.  Joint spaces appear preserved.  No definite erosions.  Regional soft tissues appear normal.  No radiopaque foreign body.  IMPRESSION: No fracture or radiopaque foreign body.   Original Report Authenticated By: Tacey Ruiz, MD   Dg Foot Complete Right  02/02/2013   *RADIOLOGY REPORT*  Clinical Data: Post fall, now with diffuse foot pain and swelling  RIGHT FOOT COMPLETE - 3+ VIEW  Comparison: None.  Findings:  There is apparent mild soft tissue swelling about the forefoot.  A crescentic lucency with possible associated cortical step off involving the base of the fourth metatarsal is seen only on the provided oblique radiograph of the foot.  No dislocation.  Joint spaces are preserved.  No definite erosions.  No radiopaque foreign body.  IMPRESSION:  Mild diffuse soft tissue swelling about the forefoot with possible minimally displaced fracture involving the base of the fourth metatarsal, possibly artifactual secondary to overlying adjacent  osseous structures.  Correlation for point tenderness at this location is recommended.   Original Report Authenticated By: Tacey Ruiz, MD    Scheduled Meds: . amitriptyline  100 mg Oral QHS  . ARIPiprazole  5 mg Oral Daily  . atorvastatin  80 mg Oral q1800  . clonazePAM  1 mg Oral BID  . DULoxetine  60 mg Oral Daily  . enoxaparin (LOVENOX) injection  40 mg Subcutaneous Q24H  . gabapentin  800 mg Oral TID  . omega-3 acid ethyl esters  2 g Oral BID  . pantoprazole  40 mg Oral Daily  . sodium chloride  3 mL Intravenous Q12H   Continuous Infusions:   Principal Problem:   Syncope Active Problems:   Pure hypercholesterolemia   Type II or unspecified type diabetes mellitus without mention of complication, uncontrolled   Acute renal failure   Foot fracture, right   HTN (hypertension)    Time spent: 40 minutes   [facility]  [facility] Pager [phone]. If 8PM-8AM, please contact night-coverage at [email or web address], password [facility] 02/04/2013, 8:34 AM  LOS: 2 days

## 2013-02-04 NOTE — Consult Note (Signed)
History of Present Illness: This patient was admitted about 2 days ago with history of syncope. Apparently he was doing fairly well at home that day when he was leaving taken and he felt himself falling. He does not remember any other symptoms and was found by his wife on the floor. He felt a little confused but otherwise does not in a state of swelling or other distress except having pain in his foot. He and he was admitted to the hospital the next day because of history of syncope and continued feeling of nausea and overall not feeling well. He says that the last few months he has been losing weight which has been partially intentional. He has been never cut back on his portions but also has not been as hungry. Does not think he has any nausea or diarrhea. On admission to the hospital his blood pressure was low . He is mild renal failure also   Past Medical History  Diagnosis Date  . ERECTILE DYSFUNCTION 10/29/2010  . HYPERLIPIDEMIA 10/29/2010  . DEPRESSION 10/29/2010    Dr Dub Mikes  . IBS (irritable bowel syndrome)   . GERD (gastroesophageal reflux disease)   . Anxiety   . Hypertension   . Eosinophilic esophagitis   . Hypertension   . Migraines     "severe; from the age of [age]; just stopped" (02/02/2013)  . OSTEOARTHRITIS 10/29/2010  . Chronic lower back pain   . Selective IgA immunodeficiency     "dx'd 1999; had gamma globulin treatments monthly for ~ 6 yr; went away" (02/02/2013  . Type II diabetes mellitus   . Syncope and collapse 01/31/2013    Past Surgical History  Procedure Laterality Date  . Anterior cervical decomp/discectomy fusion  09/2010    Dr. Yevette Edwards  . Lumbar fusion  2007    "6 months after last fusion, screw came loose & they went back in to put in larger screw" (02/02/2013)  . Lumbar fusion  2006    "S1" (02/03/2103)  . Nasal sinus surgery  1980's  . Spinal cord stimulator implant  2011  . Lumbar laminectomy Right ~ 2000  . Back surgery    . Peripherally inserted central  catheter insertion Left     "for gamma globulin treatments" (02/02/2013)    Family History  Problem Relation Age of Onset  . Alcohol abuse Father   . Hypertension Father   . Hyperlipidemia Father   . Prostate cancer Father   . Arthritis Father   . Diabetes Father   . Arthritis Mother   . Diabetes Mother   . Hypertension Mother   . Stroke Paternal Grandmother   . Heart disease Paternal Grandmother   . Colon cancer Maternal Grandfather 41  . Heart disease Paternal Uncle     x 2  . Colon cancer Cousin 48  . Esophageal cancer Paternal Uncle   . Stroke Maternal Grandmother   . Prostate cancer Paternal Uncle     x 4  . Prostate cancer Cousin     x 2    Social History:  reports that he has never smoked. He has never used smokeless tobacco. He reports that he does not drink alcohol or use illicit drugs.  Allergies:  Allergies  Allergen Reactions  . Bystolic (Nebivolol Hcl)     Makes him sweat too much    No current facility-administered medications on file prior to encounter.   Current Outpatient Prescriptions on File Prior to Encounter  Medication Sig Dispense Refill  .  amitriptyline (ELAVIL) 100 MG tablet Take 1 tablet (100 mg total) by mouth at bedtime.  30 tablet  0  . ARIPiprazole (ABILIFY) 5 MG tablet Take 1 tablet (5 mg total) by mouth daily.  30 tablet  0  . clonazePAM (KLONOPIN) 1 MG tablet Take 1 tablet (1 mg total) by mouth 2 (two) times daily.  60 tablet  0  . dicyclomine (BENTYL) 10 MG capsule Take 10 mg by mouth daily as needed. Bowls      . DULoxetine (CYMBALTA) 60 MG capsule Take 1 capsule (60 mg total) by mouth daily.  30 capsule  0  . fluticasone (FLOVENT HFA) 220 MCG/ACT inhaler 2 puffs swallowed  Twice daily.  1 Inhaler  12  . gabapentin (NEURONTIN) 800 MG tablet Take 1 tablet (800 mg total) by mouth 3 (three) times daily.  90 tablet  12  . Olmesartan-Amlodipine-HCTZ (TRIBENZOR) 40-5-25 MG TABS Take 1 tablet by mouth daily.  70 tablet  0  . omega-3 acid  ethyl esters (LOVAZA) 1 G capsule Take 2 g by mouth 2 (two) times daily.      Marland Kitchen oxycodone (ROXICODONE) 30 MG immediate release tablet Take 30 mg by mouth 4 (four) times daily.       . sitaGLIPtan-metformin (JANUMET) 50-1000 MG per tablet Take 1 tablet by mouth 2 (two) times daily with a meal.      . tadalafil (CIALIS) 5 MG tablet Take 1 tablet (5 mg total) by mouth daily as needed for erectile dysfunction.  30 tablet  11  . RABEprazole (ACIPHEX) 20 MG tablet Take 1 tablet (20 mg total) by mouth daily.  30 tablet  11    Results for orders placed during the hospital encounter of 02/02/13 (from the past 72 hour(s))  GLUCOSE, CAPILLARY     Status: Abnormal   Collection Time    02/02/13  2:40 PM      Result Value Range   Glucose-Capillary 122 (*) 70 - 99 mg/dL   Comment 1 Documented in Chart    BASIC METABOLIC PANEL     Status: Abnormal   Collection Time    02/02/13  3:00 PM      Result Value Range   Sodium 140  135 - 145 mEq/L   Potassium 4.1  3.5 - 5.1 mEq/L   Chloride 101  96 - 112 mEq/L   CO2 29  19 - 32 mEq/L   Glucose, Bld 114 (*) 70 - 99 mg/dL   BUN 24 (*) 6 - 23 mg/dL   Creatinine, Ser 4.09 (*) 0.50 - 1.35 mg/dL   Calcium 9.8  8.4 - 81.1 mg/dL   GFR calc non Af Amer 31 (*) >90 mL/min   GFR calc Af Amer 36 (*) >90 mL/min   Comment:            The eGFR has been calculated     using the CKD EPI equation.     This calculation has not been     validated in all clinical     situations.     eGFR's persistently     <90 mL/min signify     possible Chronic Kidney Disease.  CBC     Status: Abnormal   Collection Time    02/02/13  3:44 PM      Result Value Range   WBC 5.8  4.0 - 10.5 K/uL   RBC 3.95 (*) 4.22 - 5.81 MIL/uL   Hemoglobin 10.8 (*) 13.0 - 17.0 g/dL  HCT 33.5 (*) 39.0 - 52.0 %   MCV 84.8  78.0 - 100.0 fL   MCH 27.3  26.0 - 34.0 pg   MCHC 32.2  30.0 - 36.0 g/dL   RDW 16.1  09.6 - 04.5 %   Platelets 174  150 - 400 K/uL  POCT I-STAT TROPONIN I     Status: None    Collection Time    02/02/13  4:08 PM      Result Value Range   Troponin i, poc 0.04  0.00 - 0.08 ng/mL   Comment 3            Comment: Due to the release kinetics of cTnI,     a negative result within the first hours     of the onset of symptoms does not rule out     myocardial infarction with certainty.     If myocardial infarction is still suspected,     repeat the test at appropriate intervals.  HEPATIC FUNCTION PANEL     Status: Abnormal   Collection Time    02/02/13  4:52 PM      Result Value Range   Total Protein 7.9  6.0 - 8.3 g/dL   Albumin 4.1  3.5 - 5.2 g/dL   AST 23  0 - 37 U/L   ALT 36  0 - 53 U/L   Alkaline Phosphatase 95  39 - 117 U/L   Total Bilirubin 0.2 (*) 0.3 - 1.2 mg/dL   Bilirubin, Direct <4.0  0.0 - 0.3 mg/dL   Indirect Bilirubin NOT CALCULATED  0.3 - 0.9 mg/dL  CG4 I-STAT (LACTIC ACID)     Status: None   Collection Time    02/02/13  5:03 PM      Result Value Range   Lactic Acid, Venous 1.67  0.5 - 2.2 mmol/L  GLUCOSE, CAPILLARY     Status: Abnormal   Collection Time    02/02/13  9:23 PM      Result Value Range   Glucose-Capillary 114 (*) 70 - 99 mg/dL  HEMOGLOBIN J8J     Status: Abnormal   Collection Time    02/02/13 10:05 PM      Result Value Range   Hemoglobin A1C 5.8 (*) <5.7 %   Comment: (NOTE)                                                                               According to the ADA Clinical Practice Recommendations for 2011, when     HbA1c is used as a screening test:      >=6.5%   Diagnostic of Diabetes Mellitus               (if abnormal result is confirmed)     5.7-6.4%   Increased risk of developing Diabetes Mellitus     References:Diagnosis and Classification of Diabetes Mellitus,Diabetes     Care,2011,34(Suppl 1):S62-S69 and Standards of Medical Care in             Diabetes - 2011,Diabetes Care,2011,34 (Suppl 1):S11-S61.   Mean Plasma Glucose 120 (*) <117 mg/dL  TROPONIN I     Status: None   Collection Time  02/02/13  10:05 PM      Result Value Range   Troponin I <0.30  <0.30 ng/mL   Comment:            Due to the release kinetics of cTnI,     a negative result within the first hours     of the onset of symptoms does not rule out     myocardial infarction with certainty.     If myocardial infarction is still suspected,     repeat the test at appropriate intervals.  CK TOTAL AND CKMB     Status: Abnormal   Collection Time    02/02/13 10:05 PM      Result Value Range   Total CK 259 (*) 7 - 232 U/L   CK, MB 3.1  0.3 - 4.0 ng/mL   Relative Index 1.2  0.0 - 2.5  BASIC METABOLIC PANEL     Status: Abnormal   Collection Time    02/03/13  4:18 AM      Result Value Range   Sodium 140  135 - 145 mEq/L   Potassium 4.0  3.5 - 5.1 mEq/L   Chloride 102  96 - 112 mEq/L   CO2 28  19 - 32 mEq/L   Glucose, Bld 99  70 - 99 mg/dL   BUN 22  6 - 23 mg/dL   Creatinine, Ser 1.61 (*) 0.50 - 1.35 mg/dL   Calcium 8.9  8.4 - 09.6 mg/dL   GFR calc non Af Amer 35 (*) >90 mL/min   GFR calc Af Amer 41 (*) >90 mL/min   Comment:            The eGFR has been calculated     using the CKD EPI equation.     This calculation has not been     validated in all clinical     situations.     eGFR's persistently     <90 mL/min signify     possible Chronic Kidney Disease.  CBC     Status: Abnormal   Collection Time    02/03/13  4:18 AM      Result Value Range   WBC 4.9  4.0 - 10.5 K/uL   RBC 3.45 (*) 4.22 - 5.81 MIL/uL   Hemoglobin 9.4 (*) 13.0 - 17.0 g/dL   HCT 04.5 (*) 40.9 - 81.1 %   MCV 84.6  78.0 - 100.0 fL   MCH 27.2  26.0 - 34.0 pg   MCHC 32.2  30.0 - 36.0 g/dL   RDW 91.4  78.2 - 95.6 %   Platelets 142 (*) 150 - 400 K/uL  TROPONIN I     Status: None   Collection Time    02/03/13  4:18 AM      Result Value Range   Troponin I <0.30  <0.30 ng/mL   Comment:            Due to the release kinetics of cTnI,     a negative result within the first hours     of the onset of symptoms does not rule out     myocardial  infarction with certainty.     If myocardial infarction is still suspected,     repeat the test at appropriate intervals.  CK TOTAL AND CKMB     Status: None   Collection Time    02/03/13  4:18 AM      Result Value Range   Total CK 211  7 - 232 U/L   CK, MB 2.6  0.3 - 4.0 ng/mL   Relative Index 1.2  0.0 - 2.5  URINALYSIS, ROUTINE W REFLEX MICROSCOPIC     Status: Abnormal   Collection Time    02/03/13  6:36 AM      Result Value Range   Color, Urine YELLOW  YELLOW   APPearance CLEAR  CLEAR   Specific Gravity, Urine 1.017  1.005 - 1.030   pH 6.0  5.0 - 8.0   Glucose, UA NEGATIVE  NEGATIVE mg/dL   Hgb urine dipstick NEGATIVE  NEGATIVE   Bilirubin Urine NEGATIVE  NEGATIVE   Ketones, ur NEGATIVE  NEGATIVE mg/dL   Protein, ur 30 (*) NEGATIVE mg/dL   Urobilinogen, UA 0.2  0.0 - 1.0 mg/dL   Nitrite NEGATIVE  NEGATIVE   Leukocytes, UA NEGATIVE  NEGATIVE  URINE CULTURE     Status: None   Collection Time    02/03/13  6:36 AM      Result Value Range   Specimen Description URINE, CLEAN CATCH     Special Requests NONE     Culture  Setup Time 02/03/2013 10:10     Colony Count NO GROWTH     Culture NO GROWTH     Report Status 02/04/2013 FINAL    URINE MICROSCOPIC-ADD ON     Status: Abnormal   Collection Time    02/03/13  6:36 AM      Result Value Range   Squamous Epithelial / LPF RARE  RARE   WBC, UA 0-2  <3 WBC/hpf   RBC / HPF 0-2  <3 RBC/hpf   Bacteria, UA RARE  RARE   Casts GRANULAR CAST (*) NEGATIVE  GLUCOSE, CAPILLARY     Status: Abnormal   Collection Time    02/03/13  7:51 AM      Result Value Range   Glucose-Capillary 59 (*) 70 - 99 mg/dL   Comment 1 Documented in Chart     Comment 2 Notify RN    GLUCOSE, CAPILLARY     Status: None   Collection Time    02/03/13  8:25 AM      Result Value Range   Glucose-Capillary 93  70 - 99 mg/dL   Comment 1 Documented in Chart     Comment 2 Notify RN    VITAMIN B12     Status: None   Collection Time    02/03/13 10:57 AM       Result Value Range   Vitamin B-12 361  211 - 911 pg/mL  FOLATE     Status: None   Collection Time    02/03/13 10:57 AM      Result Value Range   Folate 19.8     Comment: (NOTE)     Reference Ranges            Deficient:       0.4 - 3.3 ng/mL            Indeterminate:   3.4 - 5.4 ng/mL            Normal:              > 5.4 ng/mL  IRON AND TIBC     Status: Abnormal   Collection Time    02/03/13 10:57 AM      Result Value Range   Iron 43  42 - 135 ug/dL   TIBC 161  096 - 045 ug/dL   Saturation Ratios 16 (*)  20 - 55 %   UIBC 218  125 - 400 ug/dL  FERRITIN     Status: None   Collection Time    02/03/13 10:57 AM      Result Value Range   Ferritin 97  22 - 322 ng/mL  RETICULOCYTES     Status: Abnormal   Collection Time    02/03/13 10:57 AM      Result Value Range   Retic Ct Pct 0.7  0.4 - 3.1 %   RBC. 3.85 (*) 4.22 - 5.81 MIL/uL   Retic Count, Manual 27.0  19.0 - 186.0 K/uL  TSH     Status: None   Collection Time    02/03/13 10:57 AM      Result Value Range   TSH 2.191  0.350 - 4.500 uIU/mL  GLUCOSE, CAPILLARY     Status: Abnormal   Collection Time    02/03/13 12:30 PM      Result Value Range   Glucose-Capillary 61 (*) 70 - 99 mg/dL   Comment 1 Notify RN     Comment 2 Documented in Chart    GLUCOSE, CAPILLARY     Status: None   Collection Time    02/03/13  1:18 PM      Result Value Range   Glucose-Capillary 75  70 - 99 mg/dL   Comment 1 Documented in Chart     Comment 2 Notify RN    CORTISOL     Status: None   Collection Time    02/03/13  1:33 PM      Result Value Range   Cortisol, Plasma 4.0     Comment: (NOTE)     AM:  4.3 - 22.4 ug/dL     PM:  3.1 - 16.1 ug/dL  GLUCOSE, CAPILLARY     Status: Abnormal   Collection Time    02/03/13  4:35 PM      Result Value Range   Glucose-Capillary 112 (*) 70 - 99 mg/dL   Comment 1 Notify RN     Comment 2 Documented in Chart    GLUCOSE, CAPILLARY     Status: Abnormal   Collection Time    02/03/13  9:06 PM      Result  Value Range   Glucose-Capillary 133 (*) 70 - 99 mg/dL   Comment 1 Notify RN    BASIC METABOLIC PANEL     Status: Abnormal   Collection Time    02/04/13  5:55 AM      Result Value Range   Sodium 140  135 - 145 mEq/L   Potassium 4.2  3.5 - 5.1 mEq/L   Chloride 105  96 - 112 mEq/L   CO2 30  19 - 32 mEq/L   Glucose, Bld 118 (*) 70 - 99 mg/dL   BUN 15  6 - 23 mg/dL   Creatinine, Ser 0.96 (*) 0.50 - 1.35 mg/dL   Calcium 8.8  8.4 - 04.5 mg/dL   GFR calc non Af Amer 53 (*) >90 mL/min   GFR calc Af Amer 61 (*) >90 mL/min   Comment:            The eGFR has been calculated     using the CKD EPI equation.     This calculation has not been     validated in all clinical     situations.     eGFR's persistently     <90 mL/min signify     possible Chronic Kidney Disease.  GLUCOSE, CAPILLARY     Status: None   Collection Time    02/04/13  8:02 AM      Result Value Range   Glucose-Capillary 94  70 - 99 mg/dL   Comment 1 Documented in Chart     Comment 2 Notify RN    GLUCOSE, CAPILLARY     Status: None   Collection Time    02/04/13 11:26 AM      Result Value Range   Glucose-Capillary 79  70 - 99 mg/dL   Comment 1 Documented in Chart     Comment 2 Notify RN       REVIEW OF SYSTEMS:           No unusual headaches.     ENT: No dizziness     Skin: No rash or infections     Thyroid:  No cold or heat intolerance, unusual fatigue.     His blood pressure has been -the last 3 years or so and on medications. He is also taking his blood pressure at home and blood pressure is usually 150/90.     No swelling of feet.     No shortness of breath on exertion.     Bowel habits:  No change.                    Heartburn/pain: no.       Rectal bleeding/black stools: Not present.       No frequency of urination, nocturia,       No joint  pains.       the patient has had a history of depression  and has been on Cymbalta for the last 3 years. He said that last few weeks he has been feeling lack  of motivation   GENERAL:  No pallor, clubbing, lymphadenopathy or edema.  Skin:  no rash or pigmentation.  EYES:  Externally normal.  ENT: No oral pigmentation, tongue is normal.  Mouth & throat normal.  THYROID:  Not palpable.  CAROTIDS:  Normal character; no bruit.  HEART:  Normal apex, S1 and S2; no murmur or click.  CHEST:  Normal shape and expansion.  Lungs:  Percussion normal.  Vescicular breath sounds heard equally.  No crepitations/ wheeze.  ABDOMEN:  No distention.  Liver and spleen not palpable.  No other mass or tenderness.   NEUROLOGICAL: Motor power Grossly normal     .Reflexes are  showing mild slowing of relaxation at the biceps  SPINE AND JOINTS:  Normal.  Assessment   The patient has a history of syncope of unclear etiology and subsequent finding of hypertension and hypoglycemia. He has been improved with hydration and currently blood pressure has been near normal. Also his blood sugar is stable today off the 5% dextrose. Has not had any severe hypoglycemia. Most likely he did have accumulation of his metformin related to renal insufficiency and not clear how long he has had this. Because of his cortisol level of 4.0 we'll proceed with ACTH stimulation test and decide on further treatment based on results   [facility] 02/04/2013, 1:33 PM

## 2013-02-04 NOTE — Care Management Note (Addendum)
    Page 1 of 2   02/05/2013     1:32:40 PM   CARE MANAGEMENT NOTE 02/05/2013  Patient:  Dennis Conley, Dennis Conley   Account Number:  [id number]  Date Initiated:  02/04/2013  Documentation initiated by:  GRAVES-BIGELOW,Natahsa Marian  Subjective/Objective Assessment:   Pt admitted with syncope and lives with wife. Plan is to return home when stable. Pt is refusing HH services at this time. Pt is in the process of moving to a new home and states he will only need DME RW and shower seat.     Action/Plan:   CM did relay to pt that if he needs [facility] care in the future to contact his PCP for orders. DME to be delivered to room. No further needs from CM at this time.   Anticipated DC Date:  02/05/2013   Anticipated DC Plan:  HOME/SELF CARE      DC Planning Services  CM consult      PAC Choice  DURABLE MEDICAL EQUIPMENT   Choice offered to / List presented to:  C-1 Patient   DME arranged  WALKER - ROLLING  SHOWER STOOL      DME agency  [facility]     HH arranged  HH-2 PT  HH-3 OT      [facility] agency  [facility]   Status of service:  Completed, signed off Medicare Important Message given?   (If response is "NO", the following Medicare IM given date fields will be blank) Date Medicare IM given:   Date Additional Medicare IM given:    Discharge Disposition:  HOME W HOME HEALTH SERVICES  Per UR Regulation:  Reviewed for med. necessity/level of care/duration of stay  If discussed at Long Length of Stay Meetings, dates discussed:    Comments:  02-05-13 1330 Tomi Bamberger, RN,BSN [phone] CM did speak to pt again and he states he is in the process of moving and he will work with [facility] for times to come out. Referral made with [facility] for services. SOC to begin within 24-48 hours post d/c. No further needs at this time.

## 2013-02-04 NOTE — ED Provider Notes (Addendum)
I saw and evaluated the patient, reviewed the resident's note and I agree with the findings including ECG interpretation and plan.  Patient with combination of syncope, foot tenderness with fracture, acute renal insufficiency, feel admit reasonable.  I was immediately available during resident placement of IV.  Hurman Horn, MD 02/04/13 1710    Hurman Horn, MD 02/20/13 2145

## 2013-02-04 NOTE — Progress Notes (Signed)
Physical Therapy Treatment Patient Details Name: Dennis Conley MRN: [id number] DOB: 1960-08-18 Today's Date: 02/04/2013 Time: 7829-5621 PT Time Calculation (min): 26 min  PT Assessment / Plan / Recommendation  PT Comments   Pt progressing with mobility however still limited by fatigue and decreased safety with stair ambulation. Pt encouraged to increase mobility and agrees that RW best DME option for safety with mobility at discharge. Will continue to follow to increase independence and gait. Continue NWB status since no further clarification from MD  Follow Up Recommendations  Home health PT;Supervision for mobility/OOB     Does the patient have the potential to tolerate intense rehabilitation     Barriers to Discharge        Equipment Recommendations  Rolling walker with 5" wheels    Recommendations for Other Services    Frequency     Progress towards PT Goals Progress towards PT goals: Progressing toward goals  Plan Current plan remains appropriate    Precautions / Restrictions Precautions Precautions: Fall Precaution Comments: nwb right, pre-morbid left foot pain Restrictions Weight Bearing Restrictions: Yes RLE Weight Bearing: Non weight bearing   Pertinent Vitals/Pain 4/10 right foot, premedicated    Mobility  Bed Mobility Supine to Sit: 6: Modified independent (Device/Increase time);With rails;HOB flat Transfers Sit to Stand: 5: Supervision;From chair/3-in-1;From bed Stand to Sit: 4: Min guard;To chair/3-in-1;With armrests Details for Transfer Assistance: cueing for hand placement and safety, pt not controlling descent Ambulation/Gait Ambulation/Gait Assistance: 4: Min guard Ambulation Distance (Feet): 40 Feet (20' then 40') Assistive device: Rolling walker Ambulation/Gait Assistance Details: cueing for posture and position in RW, pt fatigued after each gait trial Gait Pattern: Step-to pattern Gait velocity: decreased Stairs: Yes Stairs Assistance: 4: Min  assist Stairs Assistance Details (indicate cue type and reason): instruction for pt and wife for sequence, holding RW and safety, pt with tendency to try to stand on RLE particularly once RW uneven on steps Stair Management Technique: Backwards;With walker Number of Stairs: 3    Exercises     PT Diagnosis:    PT Problem List:   PT Treatment Interventions:     PT Goals (current goals can now be found in the care plan section)    Visit Information  Last PT Received On: 02/04/13 Assistance Needed: +1 History of Present Illness: Pt with fall and right 4th metatarsal fx    Subjective Data      Cognition  Cognition Arousal/Alertness: Awake/alert Behavior During Therapy: WFL for tasks assessed/performed Overall Cognitive Status: Within Functional Limits for tasks assessed    Balance     End of Session PT - End of Session Equipment Utilized During Treatment: Gait belt Activity Tolerance: Patient tolerated treatment well Patient left: in chair;with call bell/phone within reach;with family/visitor present   GP     Dennis Conley Dennis 02/04/2013, 12:40 PM Dennis Conley, PT [phone]

## 2013-02-04 NOTE — Progress Notes (Signed)
Occupational Therapy Treatment Patient Details Name: Dennis Conley MRN: [id number] DOB: 06/09/1961 Today's Date: 02/04/2013 Time: 8413-2440 OT Time Calculation (min): 11 min  OT Assessment / Plan / Recommendation  OT comments  Pt making progress toward goals.   Follow Up Recommendations  No OT follow up;Supervision/Assistance - 24 hour    Barriers to Discharge       Equipment Recommendations  Tub/shower bench    Recommendations for Other Services    Frequency Min 2X/week   Progress towards OT Goals Progress towards OT goals: Progressing toward goals  Plan Discharge plan remains appropriate    Precautions / Restrictions Precautions Precautions: Fall Precaution Comments: nwb right, pre-morbid left foot pain   Pertinent Vitals/Pain See vitals    ADL  Tub/Shower Transfer: Simulated;Supervision/safety Tub/Shower Transfer Equipment: Transfer tub bench (simulated) ADL Comments: Discussed use of tub transfer bench vs shower chair. Recommended use of tub transfer bench as safest option with NWB RLE.  Pt is familiar with use of bench as he has seen family members use them before.   Pt simulated use of tub bench by scooting along EOB and raising RLE over object (simulating tub ledge).  Session limited by arrival of nursing staff for procedure.    OT Diagnosis:    OT Problem List:   OT Treatment Interventions:     OT Goals(current goals can now be found in the care plan section) ADL Goals Pt Will Transfer to Toilet: with supervision;ambulating;regular height toilet;bedside commode Pt Will Perform Toileting - Clothing Manipulation and hygiene: with supervision;sit to/from stand Pt Will Perform Tub/Shower Transfer: Tub transfer;with supervision;ambulating;shower seat;tub bench;rolling walker  Visit Information  Last OT Received On: 02/04/13 Assistance Needed: +1    Subjective Data      Prior Functioning       Cognition  Cognition Arousal/Alertness: Awake/alert Behavior  During Therapy: WFL for tasks assessed/performed Overall Cognitive Status: Within Functional Limits for tasks assessed    Mobility  Bed Mobility Bed Mobility: Supine to Sit;Sitting - Scoot to Edge of Bed;Sit to Supine Supine to Sit: 6: Modified independent (Device/Increase time);With rails;HOB flat Sitting - Scoot to Edge of Bed: 6: Modified independent (Device/Increase time) Sit to Supine: 6: Modified independent (Device/Increase time)    Exercises      Balance     End of Session OT - End of Session Activity Tolerance: Patient tolerated treatment well Patient left: in bed;with nursing/sitter in room Nurse Communication: Patient requests pain meds  GO Functional Assessment Tool Used: clinical judgement Functional Limitation: Self care Self Care Current Status (N0272): At least 1 percent but less than 20 percent impaired, limited or restricted Self Care Goal Status [phone]): 0 percent impaired, limited or restricted  02/04/2013 Cipriano Mile OTR/L Pager [phone] Office [phone]  Cipriano Mile 02/04/2013, 5:17 PM

## 2013-02-05 LAB — CBC
HCT: 31.6 % — ABNORMAL LOW (ref 39.0–52.0)
Hemoglobin: 10.4 g/dL — ABNORMAL LOW (ref 13.0–17.0)
MCH: 27.4 pg (ref 26.0–34.0)
MCV: 83.2 fL (ref 78.0–100.0)
Platelets: 157 10*3/uL (ref 150–400)
RBC: 3.8 MIL/uL — ABNORMAL LOW (ref 4.22–5.81)
WBC: 5.4 10*3/uL (ref 4.0–10.5)

## 2013-02-05 LAB — COMPREHENSIVE METABOLIC PANEL
BUN: 11 mg/dL (ref 6–23)
Calcium: 9 mg/dL (ref 8.4–10.5)
GFR calc Af Amer: 65 mL/min — ABNORMAL LOW (ref >90)
Glucose, Bld: 167 mg/dL — ABNORMAL HIGH (ref 70–99)
Total Protein: 7.3 g/dL (ref 6.0–8.3)

## 2013-02-05 LAB — GLUCOSE, CAPILLARY
Glucose-Capillary: 111 mg/dL — ABNORMAL HIGH (ref 70–99)
Glucose-Capillary: 79 mg/dL (ref 70–99)

## 2013-02-05 NOTE — Discharge Summary (Addendum)
Physician Discharge Summary  MARLOW BERENGUER MRN: [id number] DOB/AGE: 1961/02/19 52 y.o.  PCP: Thayer Headings, MD   Admit date: 02/02/2013 Discharge date: 02/05/2013  Discharge Diagnoses:      Syncope Active Problems:   Pure hypercholesterolemia   Type II or unspecified type diabetes mellitus without mention of complication, uncontrolled   Acute renal failure   Foot fracture, right   HTN (hypertension)     Medication List    STOP taking these medications       gabapentin 800 MG tablet  Commonly known as:  NEURONTIN     Olmesartan-Amlodipine-HCTZ 40-5-25 MG Tabs  Commonly known as:  TRIBENZOR     pioglitazone 45 MG tablet  Commonly known as:  ACTOS      TAKE these medications       amitriptyline 100 MG tablet  Commonly known as:  ELAVIL  Take 1 tablet (100 mg total) by mouth at bedtime.     ARIPiprazole 5 MG tablet  Commonly known as:  ABILIFY  Take 1 tablet (5 mg total) by mouth daily.     clonazePAM 1 MG tablet  Commonly known as:  KLONOPIN  Take 1 tablet (1 mg total) by mouth 2 (two) times daily.     dicyclomine 10 MG capsule  Commonly known as:  BENTYL  Take 10 mg by mouth daily as needed. Bowls     DULoxetine 60 MG capsule  Commonly known as:  CYMBALTA  Take 1 capsule (60 mg total) by mouth daily.     fluticasone 220 MCG/ACT inhaler  Commonly known as:  FLOVENT HFA  2 puffs swallowed  Twice daily.     omega-3 acid ethyl esters 1 G capsule  Commonly known as:  LOVAZA  Take 2 g by mouth 2 (two) times daily.     oxycodone 30 MG immediate release tablet  Commonly known as:  ROXICODONE  Take 30 mg by mouth 4 (four) times daily.     RABEprazole 20 MG tablet  Commonly known as:  ACIPHEX  Take 1 tablet (20 mg total) by mouth daily.     rosuvastatin 40 MG tablet  Commonly known as:  CRESTOR  Take 40 mg by mouth daily.              tadalafil 5 MG tablet  Commonly known as:  CIALIS  Take 1 tablet (5 mg total) by mouth daily as needed  for erectile dysfunction.        Discharge Condition: Stable  Disposition: 06-Home-Health Care Svc   Consults:  Endocrinology Orthopedic consultation   Significant Diagnostic Studies: US Renal  02/03/2013   *RADIOLOGY REPORT*  Clinical Data: 52 year old male with renal failure.  Hypertension and diabetes.  RENAL/URINARY TRACT ULTRASOUND COMPLETE  Comparison:  CT lumbar myelogram 11/09/2010.  Findings:  Right Kidney:  No hydronephrosis.  Renal length 9.2 cm.  Cortical echotexture within normal limits.  Left Kidney:  No hydronephrosis.  Renal length 11.5 cm.  Cortical echotexture within normal limits.  Bladder:  Unremarkable.  IMPRESSION: Negative sonographic appearance of the kidneys and bladder.   Original Report Authenticated By: Erskine Speed, M.D.   Dg Foot Complete Left  02/02/2013   *RADIOLOGY REPORT*  Clinical Data: Post fall, now with pain involving the mid and superior aspect of the left foot  LEFT FOOT - COMPLETE 3+ VIEW  Comparison: None.  Findings: No definite fracture or dislocation.  Joint spaces appear preserved.  No definite erosions.  Regional soft tissues appear  normal.  No radiopaque foreign body.  IMPRESSION: No fracture or radiopaque foreign body.   Original Report Authenticated By: Tacey Ruiz, MD   Dg Foot Complete Right  02/04/2013   *RADIOLOGY REPORT*  Clinical Data: Foot pain, evaluate fracture, abnormal radiographs with suspected fourth metatarsal fracture  RIGHT FOOT COMPLETE - 3+ VIEW  Comparison: 02/02/2013  Findings: Osseous mineralization normal. Fiberglass splint material diminishes bony detail. Linear lucency again identified at base of fourth metatarsal suspicious for a nondisplaced fracture. No additional fracture, dislocation, or bone destruction.  IMPRESSION: Linear lucency at base of fourth metatarsal suspicious for nondisplaced fracture, unchanged versus prior exam.   Original Report Authenticated By: Ulyses Southward, M.D.   Dg Foot Complete  Right  02/02/2013   *RADIOLOGY REPORT*  Clinical Data: Post fall, now with diffuse foot pain and swelling  RIGHT FOOT COMPLETE - 3+ VIEW  Comparison: None.  Findings:  There is apparent mild soft tissue swelling about the forefoot.  A crescentic lucency with possible associated cortical step off involving the base of the fourth metatarsal is seen only on the provided oblique radiograph of the foot.  No dislocation.  Joint spaces are preserved.  No definite erosions.  No radiopaque foreign body.  IMPRESSION:  Mild diffuse soft tissue swelling about the forefoot with possible minimally displaced fracture involving the base of the fourth metatarsal, possibly artifactual secondary to overlying adjacent osseous structures.  Correlation for point tenderness at this location is recommended.   Original Report Authenticated By: Tacey Ruiz, MD       Microbiology: Recent Results (from the past 240 hour(s))  URINE CULTURE     Status: None   Collection Time    02/03/13  6:36 AM      Result Value Range Status   Specimen Description URINE, CLEAN CATCH   Final   Special Requests NONE   Final   Culture  Setup Time 02/03/2013 10:10   Final   Colony Count NO GROWTH   Final   Culture NO GROWTH   Final   Report Status 02/04/2013 FINAL   Final     Labs: Results for orders placed during the hospital encounter of 02/02/13 (from the past 48 hour(s))  VITAMIN B12     Status: None   Collection Time    02/03/13 10:57 AM      Result Value Range   Vitamin B-12 361  211 - 911 pg/mL  FOLATE     Status: None   Collection Time    02/03/13 10:57 AM      Result Value Range   Folate 19.8     Comment: (NOTE)     Reference Ranges            Deficient:       0.4 - 3.3 ng/mL            Indeterminate:   3.4 - 5.4 ng/mL            Normal:              > 5.4 ng/mL  IRON AND TIBC     Status: Abnormal   Collection Time    02/03/13 10:57 AM      Result Value Range   Iron 43  42 - 135 ug/dL   TIBC 454  098 - 119 ug/dL    Saturation Ratios 16 (*) 20 - 55 %   UIBC 218  125 - 400 ug/dL  FERRITIN     Status:  None   Collection Time    02/03/13 10:57 AM      Result Value Range   Ferritin 97  22 - 322 ng/mL  RETICULOCYTES     Status: Abnormal   Collection Time    02/03/13 10:57 AM      Result Value Range   Retic Ct Pct 0.7  0.4 - 3.1 %   RBC. 3.85 (*) 4.22 - 5.81 MIL/uL   Retic Count, Manual 27.0  19.0 - 186.0 K/uL  TSH     Status: None   Collection Time    02/03/13 10:57 AM      Result Value Range   TSH 2.191  0.350 - 4.500 uIU/mL  GLUCOSE, CAPILLARY     Status: Abnormal   Collection Time    02/03/13 12:30 PM      Result Value Range   Glucose-Capillary 61 (*) 70 - 99 mg/dL   Comment 1 Notify RN     Comment 2 Documented in Chart    GLUCOSE, CAPILLARY     Status: None   Collection Time    02/03/13  1:18 PM      Result Value Range   Glucose-Capillary 75  70 - 99 mg/dL   Comment 1 Documented in Chart     Comment 2 Notify RN    CORTISOL     Status: None   Collection Time    02/03/13  1:33 PM      Result Value Range   Cortisol, Plasma 4.0     Comment: (NOTE)     AM:  4.3 - 22.4 ug/dL     PM:  3.1 - 91.4 ug/dL  GLUCOSE, CAPILLARY     Status: Abnormal   Collection Time    02/03/13  4:35 PM      Result Value Range   Glucose-Capillary 112 (*) 70 - 99 mg/dL   Comment 1 Notify RN     Comment 2 Documented in Chart    GLUCOSE, CAPILLARY     Status: Abnormal   Collection Time    02/03/13  9:06 PM      Result Value Range   Glucose-Capillary 133 (*) 70 - 99 mg/dL   Comment 1 Notify RN    BASIC METABOLIC PANEL     Status: Abnormal   Collection Time    02/04/13  5:55 AM      Result Value Range   Sodium 140  135 - 145 mEq/L   Potassium 4.2  3.5 - 5.1 mEq/L   Chloride 105  96 - 112 mEq/L   CO2 30  19 - 32 mEq/L   Glucose, Bld 118 (*) 70 - 99 mg/dL   BUN 15  6 - 23 mg/dL   Creatinine, Ser 7.82 (*) 0.50 - 1.35 mg/dL   Calcium 8.8  8.4 - 95.6 mg/dL   GFR calc non Af Amer 53 (*) >90 mL/min    GFR calc Af Amer 61 (*) >90 mL/min   Comment:            The eGFR has been calculated     using the CKD EPI equation.     This calculation has not been     validated in all clinical     situations.     eGFR's persistently     <90 mL/min signify     possible Chronic Kidney Disease.  GLUCOSE, CAPILLARY     Status: None   Collection Time    02/04/13  8:02 AM  Result Value Range   Glucose-Capillary 94  70 - 99 mg/dL   Comment 1 Documented in Chart     Comment 2 Notify RN    GLUCOSE, CAPILLARY     Status: None   Collection Time    02/04/13 11:26 AM      Result Value Range   Glucose-Capillary 79  70 - 99 mg/dL   Comment 1 Documented in Chart     Comment 2 Notify RN    T4, FREE     Status: None   Collection Time    02/04/13  3:07 PM      Result Value Range   Free T4 1.14  0.80 - 1.80 ng/dL  ACTH STIMULATION, 3 TIME POINTS     Status: Abnormal   Collection Time    02/04/13  4:25 PM      Result Value Range   Cortisol, Base 6.0     Comment: (NOTE)     AM:  4.3 - 22.4 ug/dL     PM:  3.1 - 16.1 ug/dL   Cortisol, 30 Min 09.6 (*) >20 ug/dL   Cortisol, 60 Min 04.5  >20 ug/dL  GLUCOSE, CAPILLARY     Status: Abnormal   Collection Time    02/04/13  4:58 PM      Result Value Range   Glucose-Capillary 131 (*) 70 - 99 mg/dL   Comment 1 Documented in Chart     Comment 2 Notify RN    GLUCOSE, CAPILLARY     Status: Abnormal   Collection Time    02/04/13  7:54 PM      Result Value Range   Glucose-Capillary 160 (*) 70 - 99 mg/dL   Comment 1 Notify RN    GLUCOSE, CAPILLARY     Status: Abnormal   Collection Time    02/04/13 11:35 PM      Result Value Range   Glucose-Capillary 152 (*) 70 - 99 mg/dL   Comment 1 Notify RN    GLUCOSE, CAPILLARY     Status: Abnormal   Collection Time    02/05/13  3:47 AM      Result Value Range   Glucose-Capillary 111 (*) 70 - 99 mg/dL   Comment 1 Notify RN    CBC     Status: Abnormal   Collection Time    02/05/13  4:10 AM      Result Value  Range   WBC 5.4  4.0 - 10.5 K/uL   RBC 3.80 (*) 4.22 - 5.81 MIL/uL   Hemoglobin 10.4 (*) 13.0 - 17.0 g/dL   HCT 40.9 (*) 81.1 - 91.4 %   MCV 83.2  78.0 - 100.0 fL   MCH 27.4  26.0 - 34.0 pg   MCHC 32.9  30.0 - 36.0 g/dL   RDW 78.2  95.6 - 21.3 %   Platelets 157  150 - 400 K/uL  GLUCOSE, CAPILLARY     Status: None   Collection Time    02/05/13  7:43 AM      Result Value Range   Glucose-Capillary 98  70 - 99 mg/dL     HPI :  52 year old male with multiple medical problems including chronic back pain, GERD, hypertension, hyperlipidemia, diabetes mellitus on oral hypoglycemics, anxiety had a questionable syncopal episode at home yesterday. History was obtained from the patient and his wife in the room. Per patient he was walking in the kitchen when he fell, he could not distinctly recall if he had a  syncopal episode. He thinks he may have fallen on the plastic paper on the floor of the kitchen. He denied any dizziness or lightheadedness, chest pain or any palpitations. Patient's wife who was in the house heard a noise in found him on the floor of the kitchen. Patient was unable to get up by himself and noticed that his right foot was hurting. The foot pain continued to get worse and and swollen and patient came to the ED for evaluation.  In the ED, patient was noticed to have borderline soft BP, acute renal insufficiency, creatinine of 2.31. (Creatinine in 2/14 was 1.5). Right foot x-ray showed mild diffuse soft tissue swelling with possible minimally displaced fracture involving the base of the fourth metatarsal possibly artifactual secondary to overlying adjacent osseous structure. EDP had already placed a splint to the foot.   HOSPITAL COURSE:  Syncope: Orthostatic syncope? Could also be related to hypoglycemia  Cardiac enzymes negative  2-D echo shows an EF of 60-65%  Blood pressure soft  Discontinued antihypertensive medications,  Blood pressure now improving On multiple sedating  medications, which could have become dangerously toxic in the setting of his acute renal failure This was explained to the patient and has been instructed to minimize multiple medications that he's on  anemia  Baseline hg of 10.8  Anemia panel, ACD?   Hypoglycemia  In the setting of oral hypoglycemics and renal insufficiency  CBG improved off D5 Cortisol level extremely low at 4.0  RESULTS of his ACTH stimulation tests show response to above 20 after the Cortrosyn injection indicating normal response.  Considering the overall clinical picture he does not have typical symptoms of Addison's disease. He had a syncopal episode without any more long-term symptoms suggestive of cortisol deficiency. No hyponatremia present. He has also recovered fairly well after treating his dehydration and most likely his hypoglycemia was related to renal insufficiency and use of metformin    Since his blood sugars are fairly good today would not recommend any hypoglycemic agents and he can followup with his PCP next week. Will have to hold his metformin until renal function stable and improved  Would also consider tilt table testing to evaluate his episodes of syncope  If there is any question about hypoadrenal symptoms such as anorexia, weight loss and nausea he can have a 24-hour urine cortisol done as an outpatient.      Pure hypercholesterolemia:  -Continue statin     Acute renal failure: Likely prerenal, Creatinine 2.31 ( 1.5 in 2/14)  Now back to baseline Discontinued ACEI and HCTZ, metformin, NSAIDs  - Placed on hydration, creatinine improving  Negative renal ultrasound    Foot fracture, right  - Splint placed to the right foot by EDP  According to Dr. Isidoro Donning, EDP had called orthopedics  No orthopedic surgeon saw the patient yesterday  Be repeated x-rays and reconsult Dr. Charlann Boxer today from [facility]  Still waiting on consult at the time of discharge  HTN (hypertension)  - Currently  borderline soft BP, hold all antihypertensives, gentle hydration         Discharge Exam:   Blood pressure 120/85, pulse 80, temperature 98.1 F (36.7 C), temperature source Oral, resp. rate 18, height 6\' 2"  (1.88 m), weight 102 kg (224 lb 13.9 oz), SpO2 99.00%.  General: Alert, awake, oriented x3, in no acute distress.  HEENT: normocephalic, atraumatic, anicteric sclera, pink conjunctiva, pupils equal and reactive to light and accomodation, oropharynx clear  Neck: supple, no masses or lymphadenopathy,  no goiter, no bruits  Heart: Regular rate and rhythm, without murmurs, rubs or gallops.  Lungs: Clear to auscultation bilaterally, no wheezing, rales or rhonchi.  Abdomen: Soft, nontender, nondistended, positive bowel sounds, no masses.  Extremities: No clubbing, cyanosis or edema with positive pedal pulses.  Neuro: Grossly intact, no focal neurological deficits, strength 5/5 upper and lower extremities bilaterally  Psych: alert and oriented x 3, normal mood and affect  Skin: no rashes or lesions, warm and dry  Is       Signed: Georgiann Neider 02/05/2013, 8:28 AM

## 2013-02-05 NOTE — Progress Notes (Signed)
Physical Therapy Treatment Patient Details Name: Dennis Conley MRN: [id number] DOB: May 03, 1961 Today's Date: 02/05/2013 Time: 4098-1191 PT Time Calculation (min): 24 min  PT Assessment / Plan / Recommendation  PT Comments   Patient is now WBAT RLE with CAM walking boot.  Patient did well with mobility, gait, and stairs.  Achieved all goals - PT will sign off.  Remainder of issues will be addressed through HHPT f/u.  Follow Up Recommendations  Home health PT;Supervision for mobility/OOB     Does the patient have the potential to tolerate intense rehabilitation     Barriers to Discharge        Equipment Recommendations  Rolling walker with 5" wheels    Recommendations for Other Services    Frequency Min 4X/week   Progress towards PT Goals Progress towards PT goals: Goals met/education completed, patient discharged from PT  Plan Current plan remains appropriate    Precautions / Restrictions Precautions Precautions: None Required Braces or Orthoses: Other Brace/Splint Other Brace/Splint: CAM walking boot on RLE Restrictions Weight Bearing Restrictions: Yes RLE Weight Bearing: Weight bearing as tolerated (Received t.o from Dr. Charlann Boxer for WBAT RLE (see orders)) Other Position/Activity Restrictions: recommend elevate right foot   Pertinent Vitals/Pain     Mobility  Bed Mobility Bed Mobility: Not assessed Transfers Transfers: Sit to Stand;Stand to Sit Sit to Stand: 6: Modified independent (Device/Increase time);With upper extremity assist;With armrests;From chair/3-in-1 Stand to Sit: 6: Modified independent (Device/Increase time);With upper extremity assist;With armrests;To chair/3-in-1 Details for Transfer Assistance: Patient using safe technique Ambulation/Gait Ambulation/Gait Assistance: 6: Modified independent (Device/Increase time) Ambulation Distance (Feet): 250 Feet Assistive device: Rolling walker Ambulation/Gait Assistance Details: Verbal cues for gait sequence.   Patient with good gait pattern and balance now that he is WBAT RLE. Gait Pattern: Step-to pattern Gait velocity: decreased Stairs: Yes Stairs Assistance: 4: Min guard Stairs Assistance Details (indicate cue type and reason): Instructed patient on step-to technique both forward and sideways.  Patient performed forward with min guard assist. Stair Management Technique: One rail Right;Step to pattern;Forwards Number of Stairs: 3      PT Goals (current goals can now be found in the care plan section)    Visit Information  Last PT Received On: 02/05/13 Assistance Needed: +1 History of Present Illness: Pt with fall and right 4th metatarsal fx    Subjective Data  Subjective: "I hope I can go home today"   Cognition  Cognition Arousal/Alertness: Awake/alert Behavior During Therapy: WFL for tasks assessed/performed Overall Cognitive Status: Within Functional Limits for tasks assessed    Balance     End of Session PT - End of Session Equipment Utilized During Treatment: Gait belt (CAM walking boot RLE) Activity Tolerance: Patient tolerated treatment well Patient left: in chair;with call bell/phone within reach Nurse Communication: Mobility status;Patient requests pain meds   GP     Vena Austria 02/05/2013, 3:21 PM Durenda Hurt. Renaldo Fiddler, [facility] Acute Rehab Services Pager [phone]

## 2013-02-05 NOTE — Progress Notes (Signed)
Orthopedic Tech Progress Note Patient Details:  Dennis Conley 1960-12-31 [id number] Right leg posterior leg splint removed. CAM Walker applied with care instructions. Tolerated well.  Ortho Devices Type of Ortho Device: CAM walker Ortho Device/Splint Location: Right LE Ortho Device/Splint Interventions: Application   Asia R Thompson 02/05/2013, 8:54 AM

## 2013-02-05 NOTE — Progress Notes (Signed)
Subjective: Followup visit  He has no complaints today The patient has not had any further episodes of hypoglycemia or hypotension. Electrolytes are also normal. Glucose this morning was 167 on the lab. RESULTS of his ACTH stimulation tests show response to above 20 after the Cortrosyn injection indicating normal response.   Objective: Vital signs in last 24 hours: Temp:  [97.2 F (36.2 C)-98.3 F (36.8 C)] 97.2 F (36.2 C) (06/27 0800) Pulse Rate:  [80-88] 88 (06/27 0800) Resp:  [18] 18 (06/27 0800) BP: (120-149)/(82-99) 125/82 mmHg (06/27 0800) SpO2:  [97 %-99 %] 99 % (06/27 0800) Weight change:  Last BM Date: 02/03/13  Intake/Output from previous day:   Intake/Output this shift: Total I/O In: 240 [P.O.:240] Out: -   General appearance: no distress He was asleep but arousable and conversing normally.  Lab Results: CBG (last 3)   Recent Labs  02/05/13 0347 02/05/13 0743 02/05/13 1153  GLUCAP 111* 98 79     Recent Labs  02/03/13 0418 02/05/13 0410  WBC 4.9 5.4  HGB 9.4* 10.4*  HCT 29.2* 31.6*  PLT 142* 157   BMET  Recent Labs  02/04/13 0555 02/05/13 0840  NA 140 139  K 4.2 3.8  CL 105 102  CO2 30 28  GLUCOSE 118* 167*  BUN 15 11  CREATININE 1.49* 1.42*  CALCIUM 8.8 9.0    Studies/Results: US Renal  02/03/2013   *RADIOLOGY REPORT*  Clinical Data: 52 year old male with renal failure.  Hypertension and diabetes.  RENAL/URINARY TRACT ULTRASOUND COMPLETE  Comparison:  CT lumbar myelogram 11/09/2010.  Findings:  Right Kidney:  No hydronephrosis.  Renal length 9.2 cm.  Cortical echotexture within normal limits.  Left Kidney:  No hydronephrosis.  Renal length 11.5 cm.  Cortical echotexture within normal limits.  Bladder:  Unremarkable.  IMPRESSION: Negative sonographic appearance of the kidneys and bladder.   Original Report Authenticated By: Erskine Speed, M.D.   Dg Foot Complete Right  02/04/2013   *RADIOLOGY REPORT*  Clinical Data: Foot pain, evaluate  fracture, abnormal radiographs with suspected fourth metatarsal fracture  RIGHT FOOT COMPLETE - 3+ VIEW  Comparison: 02/02/2013  Findings: Osseous mineralization normal. Fiberglass splint material diminishes bony detail. Linear lucency again identified at base of fourth metatarsal suspicious for a nondisplaced fracture. No additional fracture, dislocation, or bone destruction.  IMPRESSION: Linear lucency at base of fourth metatarsal suspicious for nondisplaced fracture, unchanged versus prior exam.   Original Report Authenticated By: Ulyses Southward, M.D.    Medications: I have reviewed the patient's current medications.  Assessment/Plan: Considering the overall clinical picture he does not have typical symptoms of Addison's disease. He had a syncopal episode without any more long-term symptoms suggestive of cortisol deficiency. No hyponatremia present. He has also recovered fairly well after treating his dehydration and most likely his hypoglycemia was related to renal insufficiency and use of metformin Recommendations: Since his blood sugars are fairly good today would not recommend any hypoglycemic agents and he can followup with his PCP next week. Will have to hold his metformin until renal function stable and improved Would also consider tilt table testing to evaluate his episodes of syncope If there is any question about hypoadrenal symptoms such as anorexia, weight loss and nausea he can have a 24-hour urine cortisol done as an outpatient. To see me as needed    LOS: 3 days   Dennis Conley 02/05/2013, 2:00 PM

## 2013-02-06 LAB — ACTH STIMULATION, 3 TIME POINTS: Cortisol, Base: 6 ug/dL

## 2013-02-06 NOTE — Consult Note (Signed)
Reason for Consult: right foot fracture Referring Physician: [facility] hospitalist  Dennis Conley is an 52 y.o. male.  HPI: Patient is a 52 year old male with multiple medical problems including chronic back pain, GERD, hypertension, hyperlipidemia, diabetes mellitus on oral hypoglycemics, anxiety had a questionable syncopal episode at home yesterday. History was obtained from the patient and his wife in the room. Per patient he was walking in the kitchen when he fell, he could not distinctly recall if he had a syncopal episode. He thinks he may have fallen on the plastic paper on the floor of the kitchen. He denied any dizziness or lightheadedness, chest pain or any palpitations. Patient's wife who was in the house heard a noise in found him on the floor of the kitchen. Patient was unable to get up by himself and noticed that his right foot was hurting. The foot pain continued to get worse and and swollen and patient came to the ED for evaluation.  Consulted for recommendations for treatment and follow up of right fourth metatarsal fracture   Past Medical History  Diagnosis Date  . ERECTILE DYSFUNCTION 10/29/2010  . HYPERLIPIDEMIA 10/29/2010  . DEPRESSION 10/29/2010    Dr Dub Mikes  . IBS (irritable bowel syndrome)   . GERD (gastroesophageal reflux disease)   . Anxiety   . Hypertension   . Eosinophilic esophagitis   . Hypertension   . Migraines     "severe; from the age of [age]; just stopped" (02/02/2013)  . OSTEOARTHRITIS 10/29/2010  . Chronic lower back pain   . Selective IgA immunodeficiency     "dx'd 1999; had gamma globulin treatments monthly for ~ 6 yr; went away" (02/02/2013  . Type II diabetes mellitus   . Syncope and collapse 01/31/2013    Past Surgical History  Procedure Laterality Date  . Anterior cervical decomp/discectomy fusion  09/2010    Dr. Yevette Edwards  . Lumbar fusion  2007    "6 months after last fusion, screw came loose & they went back in to put in larger screw" (02/02/2013)   . Lumbar fusion  2006    "S1" (02/03/2103)  . Nasal sinus surgery  1980's  . Spinal cord stimulator implant  2011  . Lumbar laminectomy Right ~ 2000  . Back surgery    . Peripherally inserted central catheter insertion Left     "for gamma globulin treatments" (02/02/2013)    Family History  Problem Relation Age of Onset  . Alcohol abuse Father   . Hypertension Father   . Hyperlipidemia Father   . Prostate cancer Father   . Arthritis Father   . Diabetes Father   . Arthritis Mother   . Diabetes Mother   . Hypertension Mother   . Stroke Paternal Grandmother   . Heart disease Paternal Grandmother   . Colon cancer Maternal Grandfather 59  . Heart disease Paternal Uncle     x 2  . Colon cancer Cousin 48  . Esophageal cancer Paternal Uncle   . Stroke Maternal Grandmother   . Prostate cancer Paternal Uncle     x 4  . Prostate cancer Cousin     x 2    Social History:  reports that he has never smoked. He has never used smokeless tobacco. He reports that he does not drink alcohol or use illicit drugs.  Allergies:  Allergies  Allergen Reactions  . Bystolic (Nebivolol Hcl)     Makes him sweat too much    Medications: I have reviewed the patient's current  medications. Scheduled:  No results found for this or any previous visit (from the past 24 hour(s)).   X-ray: RIGHT FOOT COMPLETE - 3+ VIEW   Comparison: 02/02/2013   Findings:  Osseous mineralization normal.  Fiberglass splint material diminishes bony detail.  Linear lucency again identified at base of fourth metatarsal  suspicious for a nondisplaced fracture.  No additional fracture, dislocation, or bone destruction.   IMPRESSION:  Linear lucency at base of fourth metatarsal suspicious for  nondisplaced fracture, unchanged versus prior exam.  Original Report Authenticated By: Ulyses Southward, M.D.   ROS: positive for syncopal episode leading to admission but otherwise no reported hospitalizations or recent illness    Blood pressure 143/90, pulse 101, temperature 98 F (36.7 C), temperature source Oral, resp. rate 18, height 6\' 2"  (1.88 m), weight 102 kg (224 lb 13.9 oz), SpO2 99.00%.  EXAM: Seen late evening 6/26 Right splint in place Some toe swelling intact sensibility    Assessment/Plan: Closed right fourth metatarsal base fracture  Recommended to remove splint place in cam walker He can be weight bearing as tolerated PT to see before he leaves tomorrow Follow up in our office in 2-4 weeks  Deanette Tullius D 02/06/2013, 3:16 PM

## 2013-02-13 ENCOUNTER — Other Ambulatory Visit (HOSPITAL_COMMUNITY): Payer: Self-pay | Admitting: Psychiatry

## 2013-02-13 DIAGNOSIS — F329 Major depressive disorder, single episode, unspecified: Secondary | ICD-10-CM

## 2013-02-17 ENCOUNTER — Encounter (HOSPITAL_COMMUNITY): Payer: Self-pay | Admitting: Psychiatry

## 2013-02-17 ENCOUNTER — Ambulatory Visit (INDEPENDENT_AMBULATORY_CARE_PROVIDER_SITE_OTHER): Payer: Medicare Other | Admitting: Psychiatry

## 2013-02-17 VITALS — BP 136/89 | HR 107 | Ht 74.0 in | Wt 215.6 lb

## 2013-02-17 DIAGNOSIS — F339 Major depressive disorder, recurrent, unspecified: Secondary | ICD-10-CM

## 2013-02-17 DIAGNOSIS — F329 Major depressive disorder, single episode, unspecified: Secondary | ICD-10-CM

## 2013-02-17 MED ORDER — ARIPIPRAZOLE 5 MG PO TABS: 5.0000 mg | ORAL_TABLET | Freq: Every day | ORAL | Status: DC

## 2013-02-17 MED ORDER — CLONAZEPAM 1 MG PO TABS: 1.0000 mg | ORAL_TABLET | Freq: Every day | ORAL | Status: DC

## 2013-02-17 MED ORDER — AMITRIPTYLINE HCL 75 MG PO TABS: ORAL_TABLET | ORAL | Status: DC

## 2013-02-17 MED ORDER — DULOXETINE HCL 60 MG PO CPEP: 60.0000 mg | ORAL_CAPSULE | Freq: Every day | ORAL | Status: DC

## 2013-02-17 NOTE — Progress Notes (Signed)
[facility] [address] Progress Note  Dennis Conley [id number] 52 y.o.  02/17/2013 4:31 PM  Chief Complaint: I was admitted in the hospital and broke my ankle.  History of Present Illness: Patient is 52 year old [place] American male who came for his followup appointment.  Patient was recently admitted to the hospital after passing out and have a syncopal episode.  He fractured his ankle bone.  Patient do not know what happened but admitted that he has the same episode few times earlier but did not mention to anyone.  During hospitalization his Klonopin was reduced to 1 a day.  Patient is taking polypharmacy.  He has multiple physical issues.  He recently seen his primary care physician Shary Decamp who changing his medication for his diabetes.  Patient overall doing better.  However he is concerned about his physical health.  He is sleeping better.  He is tolerating Klonopin 1 a day without any withdrawals.  He has chronic depression.  He scheduled to see Boneta Lucks for counseling next week.  Patient denies any agitation, anger, mood swing or any crying spells.  He admitted chronic feelings of hopelessness but denies any active or passive suicidal thoughts.  He complained of chronic pain and concern about his physical health.  He has no tremors or shakes.  She has a high liver enzyme and his creatinine is 1.42 which actually better than before.  Patient is not aware about his high liver enzyme.  Denies any drinking.  He has difficulty walking due to pain in his ankle.  Suicidal Ideation: No Plan Formed: No Patient has means to carry out plan: No  Homicidal Ideation: No Plan Formed: No Patient has means to carry out plan: No  Review of Systems: Psychiatric: Agitation: No Hallucination: No Depressed Mood: Yes Insomnia: No Hypersomnia: No Altered Concentration: No Feels Worthless: No Grandiose Ideas: No Belief In Special Powers: No New/Increased Substance Abuse:  No Compulsions: No  Neurologic: Headache: Yes Seizure: No Paresthesias: Yes  Past Medical Family, Social History: Patient has history of chronic back pain, diabetic neuropathy, neck pain, back surgery, hypertension, benign prostatic hypertrophy, claudication, IBS.  He is taking multiple medication for his pain and diabetes.  He lives with his wife and 5 children who are under 14.  He has lived in the past in [place] and be dressed.  He moved in [place] due to his job however he is currently disabled.  He is part working since August 2011.  Outpatient Encounter Prescriptions as of 02/17/2013  Medication Sig Dispense Refill  . [DISCONTINUED] amitriptyline (ELAVIL) 100 MG tablet TAKE 1 TABLET BY MOUTH AT BEDTIME  30 tablet  1  . [DISCONTINUED] ARIPiprazole (ABILIFY) 5 MG tablet Take 1 tablet (5 mg total) by mouth daily.  30 tablet  0  . [DISCONTINUED] clonazePAM (KLONOPIN) 1 MG tablet Take 1 tablet (1 mg total) by mouth 2 (two) times daily.  60 tablet  0  . [DISCONTINUED] DULoxetine (CYMBALTA) 60 MG capsule Take 1 capsule (60 mg total) by mouth daily.  30 capsule  0  . amitriptyline (ELAVIL) 75 MG tablet TAKE 1 TABLET BY MOUTH AT BEDTIME  30 tablet  0  . ARIPiprazole (ABILIFY) 5 MG tablet Take 1 tablet (5 mg total) by mouth daily.  30 tablet  0  . clonazePAM (KLONOPIN) 1 MG tablet Take 1 tablet (1 mg total) by mouth daily.  30 tablet  0  . dicyclomine (BENTYL) 10 MG capsule Take 10 mg by mouth daily  as needed. Bowls      . DULoxetine (CYMBALTA) 60 MG capsule Take 1 capsule (60 mg total) by mouth daily.  30 capsule  0  . fluticasone (FLOVENT HFA) 220 MCG/ACT inhaler 2 puffs swallowed  Twice daily.  1 Inhaler  12  . omega-3 acid ethyl esters (LOVAZA) 1 G capsule Take 2 g by mouth 2 (two) times daily.      Marland Kitchen oxycodone (ROXICODONE) 30 MG immediate release tablet Take 30 mg by mouth 4 (four) times daily.       . RABEprazole (ACIPHEX) 20 MG tablet Take 1 tablet (20 mg total) by mouth daily.  30  tablet  11  . rosuvastatin (CRESTOR) 40 MG tablet Take 40 mg by mouth daily.      . tadalafil (CIALIS) 5 MG tablet Take 1 tablet (5 mg total) by mouth daily as needed for erectile dysfunction.  30 tablet  11   No facility-administered encounter medications on file as of 02/17/2013.    Past Psychiatric History/Hospitalization(s): Anxiety: Yes Bipolar Disorder: No Depression: Yes Mania: No Psychosis: No Schizophrenia: No Personality Disorder: No Hospitalization for psychiatric illness: No History of Electroconvulsive Shock Therapy: No Prior Suicide Attempts: No  Physical Exam: Constitutional:  BP 136/89  Pulse 107  Ht 6\' 2"  (1.88 m)  Wt 215 lb 9.6 oz (97.796 kg)  BMI 27.67 kg/m2  General Appearance: alert, oriented, no acute distress  Musculoskeletal: Strength & Muscle Tone: decreased Gait & Station: Difficulty walking due to neck pain Patient leans: N/A  Psychiatric: Speech (describe rate, volume, coherence, spontaneity, and abnormalities if any): Soft.  The decreased volume and tone  Thought Process (describe rate, content, abstract reasoning, and computation): Logical and goal-directed  Associations: Relevant and Intact  Thoughts: Preoccupied with his depressive thoughts.  No delusion or paranoia.  Mental Status: Orientation: oriented to person, place, time/date, situation and day of week Mood & Affect: depressed affect and anxiety Attention Span & Concentration: Fair  Medical Decision Making (Choose Three): Established Problem, Stable/Improving (1), New problem, with additional work up planned, Review of Psycho-Social Stressors (1), Decision to obtain old records (1), Review of Last Therapy Session (1), Review of Medication Regimen & Side Effects (2) and Review of New Medication or Change in Dosage (2)  Assessment: Axis I: Maj. depressive disorder recurrent  Axis II: Deferred  Axis III:  Patient Active Problem List   Diagnosis Date Noted  . Syncope 02/02/2013   . Acute renal failure 02/02/2013  . Foot fracture, right 02/02/2013  . HTN (hypertension) 02/02/2013  . Diabetic neuropathy 12/15/2012  . Lumbar radiculopathy 12/15/2012  . Tachycardia 07/13/2012  . Elevated total protein 11/22/2011  . BPH (benign prostatic hyperplasia) 11/20/2011  . Eosinophilic esophagitis 08/09/2011  . Type II or unspecified type diabetes mellitus without mention of complication, uncontrolled 07/31/2011  . GERD (gastroesophageal reflux disease) 03/08/2011  . Essential hypertension, benign 01/23/2011  . Pure hypercholesterolemia 01/23/2011  . Chronic back pain 01/03/2011  . Preventative health care 01/02/2011  . ERECTILE DYSFUNCTION 10/29/2010  . DEPRESSION 10/29/2010  . OSTEOARTHRITIS 10/29/2010    Axis IV: Moderate  Axis V: 55-60   Plan:  I review his blood results, discharge summary and current medication.  I recommend to decrease amitriptyline 75 mg since patient has been complaining of dizziness and recently had a syncopal episode.  We will also cut down Klonopin 1 mg a day for the same reason.  However continue Abilify at present dose.  Patient needs a result of his blood  work.  I will provide recent blood work which shows high liver enzymes since patient is not aware about his high liver enzymes.  Patient will see his primary care physician in near future.  Recommend to call us back it is a question of conservatively worsening of the symptom.  We will continue Cymbalta at present dose.  Patient is scheduled to see therapist next week.  I will see him again in 4 weeks.  Time spent 25 minutes.  More than 50% of the time spent and psychoeducation, counseling and coordination of care.  Briston Lax T., MD 02/17/2013

## 2013-02-24 ENCOUNTER — Ambulatory Visit (INDEPENDENT_AMBULATORY_CARE_PROVIDER_SITE_OTHER): Payer: 59 | Admitting: Psychiatry

## 2013-02-24 DIAGNOSIS — F3289 Other specified depressive episodes: Secondary | ICD-10-CM

## 2013-02-24 DIAGNOSIS — F329 Major depressive disorder, single episode, unspecified: Secondary | ICD-10-CM

## 2013-02-25 ENCOUNTER — Encounter (HOSPITAL_COMMUNITY): Payer: Self-pay | Admitting: Psychiatry

## 2013-02-25 NOTE — Progress Notes (Signed)
Patient ID: Dennis Conley, male   DOB: 09/07/1960, 52 y.o.   MRN: [id number] Presenting Problem Chief Complaint: depression  What are the main stressors in your life right now, how long? Chronic back pain, type 2 diabetes, tendency to isolate, social anxiety, poor communication in marriage, career   Previous mental health services Have you ever been treated for a mental health problem, when, where, by whom? Yes. Two sessions of psych IOP    Are you currently seeing a therapist or counselor, counselor's name? No   Have you ever had a mental health hospitalization, how many times, length of stay? No    Have you ever had suicidal thoughts or attempted suicide, when, how? No   Risk factors for Suicide Demographic factors:  Male Current mental status: none Loss factors: Decrease in vocational status Historical factors: none Risk Reduction factors: Living with another person, especially a relative Clinical factors:  Depression and anxiety Cognitive features that contribute to risk: none  SUICIDE RISK:  Minimal: No identifiable suicidal ideation.  Patients presenting with no risk factors but with morbid ruminations; may be classified as minimal risk based on the severity of the depressive symptoms   Social/family history Have you been married, how many times?  One wife, married 31 years  Do you have children?  5 children, 16, 15, 13, 12, 10   Who lives in your current household? Wife, 5 children  Military history: No   Religious/spiritual involvement:  What religion/faith base are you? christian  Family of origin (childhood history)  Where were you born? [place] Where did you grow up? [place]  Describe the atmosphere of the household where you grew up: very religious, restrictive, controlling immediate and extended family Do you have siblings? Yes . 7 siblings  Are your parents separated/divorced, when and why? No   Are your parents alive? Yes   Social supports (personal and  professional):   Education How many grades have you completed? college graduate Did you have any problems in school, what type? No  Medications prescribed for these problems? No   Employment (financial issues) Unemployed, disability related to chronic back pain  Legal history none  Trauma/Abuse history: Have you ever been exposed to any form of abuse, what type? No   Have you ever been exposed to something traumatic, describe? No   Substance use None reported  Mental Status: General Appearance [place]:  Casual Eye Contact:  Good Motor Behavior:  Normal Speech:  Normal Level of Consciousness:  Alert Mood:  Euthymic Affect:  Appropriate Anxiety Level: minimal Thought Process:  Coherent Thought Content:  WNL Perception:  Normal Judgment:  Good Insight:  Present Cognition:  wnl  Diagnosis AXIS I Depressive Disorder NOS  AXIS II No diagnosis  AXIS III Past Medical History  Diagnosis Date  . ERECTILE DYSFUNCTION 10/29/2010  . HYPERLIPIDEMIA 10/29/2010  . DEPRESSION 10/29/2010    Dr Dub Mikes  . IBS (irritable bowel syndrome)   . GERD (gastroesophageal reflux disease)   . Anxiety   . Hypertension   . Eosinophilic esophagitis   . Hypertension   . Migraines     "severe; from the age of [age]; just stopped" (02/02/2013)  . OSTEOARTHRITIS 10/29/2010  . Chronic lower back pain   . Selective IgA immunodeficiency     "dx'd 1999; had gamma globulin treatments monthly for ~ 6 yr; went away" (02/02/2013  . Type II diabetes mellitus   . Syncope and collapse 01/31/2013    AXIS IV occupational problems and other  psychosocial or environmental problems  AXIS V 51-60 moderate symptoms   Plan: Pt. To return in 1-2 weeks for continued assessment.  _________________________________________ Dennis Conley, Ph.D., LPC, NCC

## 2013-03-04 ENCOUNTER — Encounter (HOSPITAL_COMMUNITY): Payer: Self-pay | Admitting: Psychiatry

## 2013-03-04 ENCOUNTER — Ambulatory Visit (INDEPENDENT_AMBULATORY_CARE_PROVIDER_SITE_OTHER): Payer: 59 | Admitting: Psychiatry

## 2013-03-04 DIAGNOSIS — F329 Major depressive disorder, single episode, unspecified: Secondary | ICD-10-CM

## 2013-03-04 NOTE — Progress Notes (Signed)
   THERAPIST PROGRESS NOTE  Session Time: 8:00-8:50  Participation Level: Active  Behavioral Response: CasualAlertEuthymic  Type of Therapy: Individual Therapy  Treatment Goals addressed: communication skills, emotion regulation, stress management  Interventions: CBT  Summary: Dennis Conley is a 52 y.o. male who presents with depression.   Suicidal/Homicidal: Nowithout intent/plan  Therapist Response: Pt. Discussed stress of recent move, plans to return to college and study psychology, disengagement from wife. Focused on themes related to self-esteem, patterns of disengagement in marriage (i.e, turning away, excluding from dreams and plans), responsibility for marital problems.  Plan: Return again in 2 weeks.  Diagnosis: Axis I: Depressive Disorder NOS    Axis II: No diagnosis    Wynonia Musty 03/04/2013

## 2013-03-11 ENCOUNTER — Ambulatory Visit (HOSPITAL_COMMUNITY): Payer: Self-pay | Admitting: Psychiatry

## 2013-03-19 ENCOUNTER — Encounter (HOSPITAL_COMMUNITY): Payer: Self-pay | Admitting: Psychiatry

## 2013-03-19 ENCOUNTER — Telehealth (HOSPITAL_COMMUNITY): Payer: Self-pay

## 2013-03-19 ENCOUNTER — Ambulatory Visit (INDEPENDENT_AMBULATORY_CARE_PROVIDER_SITE_OTHER): Payer: 59 | Admitting: Psychiatry

## 2013-03-19 VITALS — BP 140/90 | HR 92 | Ht 74.0 in | Wt 214.0 lb

## 2013-03-19 DIAGNOSIS — F339 Major depressive disorder, recurrent, unspecified: Secondary | ICD-10-CM

## 2013-03-19 DIAGNOSIS — F329 Major depressive disorder, single episode, unspecified: Secondary | ICD-10-CM

## 2013-03-19 MED ORDER — CLONAZEPAM 1 MG PO TABS: 1.0000 mg | ORAL_TABLET | Freq: Every day | ORAL | Status: DC

## 2013-03-19 MED ORDER — DULOXETINE HCL 60 MG PO CPEP: 60.0000 mg | ORAL_CAPSULE | Freq: Every day | ORAL | Status: DC

## 2013-03-19 MED ORDER — AMITRIPTYLINE HCL 75 MG PO TABS: ORAL_TABLET | ORAL | Status: DC

## 2013-03-19 MED ORDER — ARIPIPRAZOLE 5 MG PO TABS: 5.0000 mg | ORAL_TABLET | Freq: Every day | ORAL | Status: DC

## 2013-03-19 NOTE — Progress Notes (Signed)
[facility] [id number] Progress Note  Dennis Conley [id number] 52 y.o.  03/19/2013 11:01 AM  Chief Complaint:   medication management and follow up.  History of Present Illness: Patient is 52 year old [place] American male who came for his followup appointment.   Patient is compliant with his psychiatric medication.  [facility]  He has blood work however we do not have the results.  The patient do not have any dizziness or any syncopal episode since his last visit.  He complained off ankle pain but overall he is feeling better.  He sleeping better.  He denies any recent irritability and anger or mood swing.  His primary care physician has changed his medication.   We have recommended to him about his high liver enzymes and patient is waiting for his result from his nephrologist.  He has kidney function test and liver enzyme yesterday.  Patient continues to have difficulty walking due to the pain in his ankle .  He start seeing therapist in this office I like to continue followup.  Suicidal Ideation: No Plan Formed: No Patient has means to carry out plan: No  Homicidal Ideation: No Plan Formed: No Patient has means to carry out plan: No  Review of Systems: Psychiatric: Agitation: No Hallucination: No Depressed Mood: Yes Insomnia: No Hypersomnia: No Altered Concentration: No Feels Worthless: No Grandiose Ideas: No Belief In Special Powers: No New/Increased Substance Abuse: No Compulsions: No  Neurologic: Headache: Yes Seizure: No Paresthesias: Yes  Past Medical Family, Social History: Patient has history of chronic back pain, diabetic neuropathy, neck pain, back surgery, hypertension, benign prostatic hypertrophy, claudication, IBS.  He is taking multiple medication for his pain and diabetes.  He lives with his wife and 5 children who are under 14.  He has lived in the past in [place] and be dressed.  He moved in  [place] due to his job however he is currently disabled.  He is part working since August 2011.  Outpatient Encounter Prescriptions as of 03/19/2013  Medication Sig Dispense Refill  . amitriptyline (ELAVIL) 75 MG tablet TAKE 1 TABLET BY MOUTH AT BEDTIME  30 tablet  1  . ARIPiprazole (ABILIFY) 5 MG tablet Take 1 tablet (5 mg total) by mouth daily.  30 tablet  1  . clonazePAM (KLONOPIN) 1 MG tablet Take 1 tablet (1 mg total) by mouth daily.  30 tablet  1  . dicyclomine (BENTYL) 10 MG capsule Take 10 mg by mouth daily as needed. Bowls      . DULoxetine (CYMBALTA) 60 MG capsule Take 1 capsule (60 mg total) by mouth daily.  30 capsule  1  . fluticasone (FLOVENT HFA) 220 MCG/ACT inhaler 2 puffs swallowed  Twice daily.  1 Inhaler  12  . gabapentin (NEURONTIN) 800 MG tablet       . JANUMET 50-1000 MG per tablet       . losartan (COZAAR) 100 MG tablet       . oxycodone (ROXICODONE) 30 MG immediate release tablet Take 30 mg by mouth 4 (four) times daily.       . rosuvastatin (CRESTOR) 40 MG tablet Take 40 mg by mouth daily.      . tadalafil (CIALIS) 5 MG tablet Take 1 tablet (5 mg total) by mouth daily as needed for erectile dysfunction.  30 tablet  11  . [DISCONTINUED] amitriptyline (ELAVIL) 75 MG tablet TAKE 1 TABLET BY MOUTH AT BEDTIME  30  tablet  0  . [DISCONTINUED] ARIPiprazole (ABILIFY) 5 MG tablet Take 1 tablet (5 mg total) by mouth daily.  30 tablet  0  . [DISCONTINUED] clonazePAM (KLONOPIN) 1 MG tablet Take 1 tablet (1 mg total) by mouth daily.  30 tablet  0  . [DISCONTINUED] DULoxetine (CYMBALTA) 60 MG capsule Take 1 capsule (60 mg total) by mouth daily.  30 capsule  0  . RABEprazole (ACIPHEX) 20 MG tablet Take 1 tablet (20 mg total) by mouth daily.  30 tablet  11  . [DISCONTINUED] omega-3 acid ethyl esters (LOVAZA) 1 G capsule Take 2 g by mouth 2 (two) times daily.       No facility-administered encounter medications on file as of 03/19/2013.    Past Psychiatric  History/Hospitalization(s): Anxiety: Yes Bipolar Disorder: No Depression: Yes Mania: No Psychosis: No Schizophrenia: No Personality Disorder: No Hospitalization for psychiatric illness: No History of Electroconvulsive Shock Therapy: No Prior Suicide Attempts: No  Physical Exam: Constitutional:  BP 140/90  Pulse 92  Ht 6\' 2"  (1.88 m)  Wt 214 lb (97.07 kg)  BMI 27.46 kg/m2  General Appearance: alert, oriented, no acute distress  Musculoskeletal: Strength & Muscle Tone: decreased Gait & Station: Difficulty walking due to neck pain Patient leans: N/A  Psychiatric: Speech (describe rate, volume, coherence, spontaneity, and abnormalities if any): Soft.  The decreased volume and tone  Thought Process (describe rate, content, abstract reasoning, and computation): Logical and goal-directed  Associations: Relevant and Intact  Thoughts: Preoccupied with his depressive thoughts.  No delusion or paranoia.  Mental Status: Orientation: oriented to person, place, time/date, situation and day of week Mood & Affect: depressed affect and anxiety Attention Span & Concentration: Fair  Medical Decision Making (Choose Three): Established Problem, Stable/Improving (1), Review of Last Therapy Session (1) and Review of Medication Regimen & Side Effects (2)  Assessment: Axis I: Maj. depressive disorder recurrent  Axis II: Deferred  Axis III:  Patient Active Problem List   Diagnosis Date Noted  . Syncope 02/02/2013  . Acute renal failure 02/02/2013  . Foot fracture, right 02/02/2013  . HTN (hypertension) 02/02/2013  . Diabetic neuropathy 12/15/2012  . Lumbar radiculopathy 12/15/2012  . Tachycardia 07/13/2012  . Elevated total protein 11/22/2011  . BPH (benign prostatic hyperplasia) 11/20/2011  . Eosinophilic esophagitis 08/09/2011  . Type II or unspecified type diabetes mellitus without mention of complication, uncontrolled 07/31/2011  . GERD (gastroesophageal reflux disease)  03/08/2011  . Essential hypertension, benign 01/23/2011  . Pure hypercholesterolemia 01/23/2011  . Chronic back pain 01/03/2011  . Preventative health care 01/02/2011  . ERECTILE DYSFUNCTION 10/29/2010  . DEPRESSION 10/29/2010  . OSTEOARTHRITIS 10/29/2010    Axis IV: Moderate  Axis V: 55-60   Plan:  I will continue his current psychiatric medication with the Cymbalta 60 mg daily, amitriptyline 75 mg at bedtime, Klonopin 1 mg daily and Abilify 5 mg daily.  Strongly recommended to have his blood work results faxed to [place] because he had history of high liver enzymes and high creatinine.  He is seeing nephrologist.  Recommend to call us back if he is any question or concern.  Patient is also seeing therapist in his office is helping him.  I'll see him again in 2 months.  Nikko Quast T., MD 03/19/2013

## 2013-03-22 ENCOUNTER — Telehealth (HOSPITAL_COMMUNITY): Payer: Self-pay

## 2013-03-22 ENCOUNTER — Ambulatory Visit (HOSPITAL_COMMUNITY): Payer: Self-pay | Admitting: Psychiatry

## 2013-03-22 NOTE — Telephone Encounter (Signed)
8:28am 03/22/13 Called and left msg for pt to call and r/s missed appt.Dennis KitchenMarguerite Olea

## 2013-04-29 ENCOUNTER — Encounter (HOSPITAL_COMMUNITY): Payer: Self-pay | Admitting: Psychiatry

## 2013-04-29 ENCOUNTER — Ambulatory Visit (INDEPENDENT_AMBULATORY_CARE_PROVIDER_SITE_OTHER): Payer: 59 | Admitting: Psychiatry

## 2013-04-29 DIAGNOSIS — F329 Major depressive disorder, single episode, unspecified: Secondary | ICD-10-CM

## 2013-04-29 NOTE — Progress Notes (Signed)
Patient ID: Dennis Conley, male   DOB: [birth year], 52 y.o.   MRN: [id number]  THERAPIST PROGRESS NOTE  Session Time: 8:00-8:50   Participation Level: Active   Behavioral Response: CasualAlertEuthymic   Type of Therapy: Individual Therapy   Treatment Goals addressed: communication skills, emotion regulation, stress management   Interventions: CBT   Summary: Dennis Conley is a 52 y.o. male who presents with depression.   Suicidal/Homicidal: Nowithout intent/plan   Therapist Response: Pt. Reports significant improvement in depression as indicated by positive mood, increased energy to perform tasks related to caregiving and school assignments. Pt. Reports that he is enjoying online college degree programand. Pt. Reports that 50 year old adopted daughter has moved into home with her boyfriend and four children. Pt. Reports that the he attributes positive mood to having grandchildren in the house and that he and his wife and their five children have adjusted to new living arrangements. Focused on themes related to pattern of disengagement from his daughter who is also diagnosed with depression and had suicide attempt in May and developing awareness and acceptance of emotions and thoughts in order to improve communication with family members.    Plan: Return again in 2 weeks.   Diagnosis: Axis I: Depressive Disorder NOS   Axis II: No diagnosis   Wynonia Musty  04/30/2013

## 2013-05-18 ENCOUNTER — Ambulatory Visit (HOSPITAL_COMMUNITY): Payer: Self-pay | Admitting: Psychiatry

## 2013-05-18 ENCOUNTER — Telehealth (HOSPITAL_COMMUNITY): Payer: Self-pay

## 2013-05-18 NOTE — Telephone Encounter (Signed)
9:58am 05/18/13 Patient woyuld like for provider-Jennifer to call him - pt missed his appt.  Patient need to discuss some issues.Dennis KitchenMarguerite Olea

## 2013-05-19 ENCOUNTER — Ambulatory Visit (HOSPITAL_COMMUNITY): Payer: Self-pay | Admitting: Psychiatry

## 2013-05-24 ENCOUNTER — Encounter (HOSPITAL_COMMUNITY): Payer: Self-pay

## 2013-06-01 ENCOUNTER — Ambulatory Visit (INDEPENDENT_AMBULATORY_CARE_PROVIDER_SITE_OTHER): Payer: 59 | Admitting: Psychiatry

## 2013-06-01 ENCOUNTER — Encounter (HOSPITAL_COMMUNITY): Payer: Self-pay | Admitting: Psychiatry

## 2013-06-01 VITALS — BP 144/91 | HR 109 | Wt 213.0 lb

## 2013-06-01 DIAGNOSIS — Z79899 Other long term (current) drug therapy: Secondary | ICD-10-CM

## 2013-06-01 DIAGNOSIS — F329 Major depressive disorder, single episode, unspecified: Secondary | ICD-10-CM

## 2013-06-01 DIAGNOSIS — F339 Major depressive disorder, recurrent, unspecified: Secondary | ICD-10-CM

## 2013-06-01 LAB — HEMOGLOBIN A1C: Mean Plasma Glucose: 134 mg/dL — ABNORMAL HIGH (ref <117)

## 2013-06-01 LAB — CBC WITH DIFFERENTIAL/PLATELET
Basophils Absolute: 0 10*3/uL (ref 0.0–0.1)
Basophils Relative: 0 % (ref 0–1)
Eosinophils Relative: 2 % (ref 0–5)
Lymphocytes Relative: 28 % (ref 12–46)
MCV: 79.4 fL (ref 78.0–100.0)
Neutro Abs: 3.8 10*3/uL (ref 1.7–7.7)
Platelets: 192 10*3/uL (ref 150–400)
RDW: 14 % (ref 11.5–15.5)
WBC: 6 10*3/uL (ref 4.0–10.5)

## 2013-06-01 MED ORDER — DULOXETINE HCL 60 MG PO CPEP: 60.0000 mg | ORAL_CAPSULE | Freq: Every day | ORAL | Status: DC

## 2013-06-01 MED ORDER — ARIPIPRAZOLE 5 MG PO TABS: 5.0000 mg | ORAL_TABLET | Freq: Every day | ORAL | Status: DC

## 2013-06-01 MED ORDER — AMITRIPTYLINE HCL 75 MG PO TABS: ORAL_TABLET | ORAL | Status: DC

## 2013-06-01 NOTE — Progress Notes (Signed)
[facility] [id number] Progress Note  Dennis Conley [id number] 52 y.o.  06/01/2013 9:13 AM  Chief Complaint: I am under a lot of stress.  History of Present Illness: Patient is 52 year old [place] American male who came for his followup appointment.   Patient missed his last appointment.  Patient told he was very busy dealing with the family issues.  Recently his family member moved in to live with him .  Patient admitted increased anxiety nervousness and feeling down.  He has difficulty adjusting to the new environment.  He admitted poor sleep, poor appetite and lost weight from the past.  He denies any suicidal thoughts but admitted isolative and withdrawn.  Recently he seen his nephrologist and his medicine has been adjusted.  His Janumet is reduced.  He had a blood work at [facility] and he was told that his blood results are okay.  However we do not have the results available.  Patient also has high liver enzyme.  He used to take higher dose of amitriptyline however due to increased liver enzyme and high kidney function test dose were reduced .  He seeing therapist.  He is compliant with his psychotropic medication.  He is taking Klonopin, amitriptyline, Abilify as prescribed.  He is not drinking or using any illegal substance.  He denies any hallucinations or any paranoia.  He denies any anger or agitation .  Suicidal Ideation: No Plan Formed: No Patient has means to carry out plan: No  Homicidal Ideation: No Plan Formed: No Patient has means to carry out plan: No  Review of Systems: Psychiatric: Agitation: No Hallucination: No Depressed Mood: Yes Insomnia: Yes Hypersomnia: No Altered Concentration: No Feels Worthless: No Grandiose Ideas: No Belief In Special Powers: No New/Increased Substance Abuse: No Compulsions: No  Neurologic: Headache: Yes Seizure: No Paresthesias: Yes  Past Medical Family, Social History: Patient has history of chronic back pain,  diabetic neuropathy, neck pain, back surgery, hypertension, benign prostatic hypertrophy, claudication, IBS.  He is taking multiple medication for his pain and diabetes.  He lives with his wife and 5 children who are under 14.  He has lived in the past in [place] and be dressed.  He moved in [place] due to his job however he is currently disabled.  He is part working since August 2011.  Outpatient Encounter Prescriptions as of 06/01/2013  Medication Sig Dispense Refill  . amitriptyline (ELAVIL) 75 MG tablet TAKE 1 TABLET BY MOUTH AT BEDTIME  30 tablet  0  . ARIPiprazole (ABILIFY) 5 MG tablet Take 1 tablet (5 mg total) by mouth daily.  30 tablet  0  . clonazePAM (KLONOPIN) 1 MG tablet Take 1 tablet (1 mg total) by mouth daily.  30 tablet  1  . dicyclomine (BENTYL) 10 MG capsule Take 10 mg by mouth daily as needed. Bowls      . DULoxetine (CYMBALTA) 60 MG capsule Take 1 capsule (60 mg total) by mouth daily.  30 capsule  0  . fluticasone (FLOVENT HFA) 220 MCG/ACT inhaler 2 puffs swallowed  Twice daily.  1 Inhaler  12  . gabapentin (NEURONTIN) 800 MG tablet       . JANUMET 50-1000 MG per tablet       . losartan (COZAAR) 100 MG tablet       . oxycodone (ROXICODONE) 30 MG immediate release tablet Take 30 mg by mouth 4 (four) times daily.       . RABEprazole (ACIPHEX) 20 MG tablet Take 1  tablet (20 mg total) by mouth daily.  30 tablet  11  . rosuvastatin (CRESTOR) 40 MG tablet Take 40 mg by mouth daily.      . tadalafil (CIALIS) 5 MG tablet Take 1 tablet (5 mg total) by mouth daily as needed for erectile dysfunction.  30 tablet  11  . [DISCONTINUED] amitriptyline (ELAVIL) 75 MG tablet TAKE 1 TABLET BY MOUTH AT BEDTIME  30 tablet  1  . [DISCONTINUED] ARIPiprazole (ABILIFY) 5 MG tablet Take 1 tablet (5 mg total) by mouth daily.  30 tablet  1  . [DISCONTINUED] DULoxetine (CYMBALTA) 60 MG capsule Take 1 capsule (60 mg total) by mouth daily.  30 capsule  1   No facility-administered encounter  medications on file as of 06/01/2013.    Past Psychiatric History/Hospitalization(s): Anxiety: Yes Bipolar Disorder: No Depression: Yes Mania: No Psychosis: No Schizophrenia: No Personality Disorder: No Hospitalization for psychiatric illness: No History of Electroconvulsive Shock Therapy: No Prior Suicide Attempts: No  Physical Exam: Constitutional:  BP 144/91  Pulse 109  Wt 213 lb (96.616 kg)  BMI 27.34 kg/m2  General Appearance: alert, oriented, no acute distress  Musculoskeletal: Strength & Muscle Tone: decreased Gait & Station: Difficulty walking due to neck pain Patient leans: N/A  Psychiatric: Speech (describe rate, volume, coherence, spontaneity, and abnormalities if any): Soft.  The decreased volume and tone  Thought Process (describe rate, content, abstract reasoning, and computation): Logical and goal-directed  Associations: Relevant and Intact  Thoughts: Preoccupied with his depressive thoughts.  No delusion or paranoia.  Mental Status: Orientation: oriented to person, place, time/date, situation and day of week Mood & Affect: depressed affect and anxiety Attention Span & Concentration: Fair  Medical Decision Making (Choose Three): Established Problem, Stable/Improving (1), Review of Psycho-Social Stressors (1), Review or order clinical lab tests (1), Decision to obtain old records (1), Established Problem, Worsening (2), Review of Last Therapy Session (1) and Review of Medication Regimen & Side Effects (2)  Assessment: Axis I: Maj. depressive disorder recurrent  Axis II: Deferred  Axis III:  Patient Active Problem List   Diagnosis Date Noted  . Syncope 02/02/2013  . Acute renal failure 02/02/2013  . Foot fracture, right 02/02/2013  . HTN (hypertension) 02/02/2013  . Diabetic neuropathy 12/15/2012  . Lumbar radiculopathy 12/15/2012  . Tachycardia 07/13/2012  . Elevated total protein 11/22/2011  . BPH (benign prostatic hyperplasia) 11/20/2011   . Eosinophilic esophagitis 08/09/2011  . Type II or unspecified type diabetes mellitus without mention of complication, uncontrolled 07/31/2011  . GERD (gastroesophageal reflux disease) 03/08/2011  . Essential hypertension, benign 01/23/2011  . Pure hypercholesterolemia 01/23/2011  . Chronic back pain 01/03/2011  . Preventative health care 01/02/2011  . ERECTILE DYSFUNCTION 10/29/2010  . DEPRESSION 10/29/2010  . OSTEOARTHRITIS 10/29/2010    Axis IV: Moderate  Axis V: 55-60   Plan:  I will order comprehensive metabolic panel since he has a high liver enzymes.  [facility]  I will defer any further adjustment of medication unless we have the results are available.  We will continue Cymbalta 60 mg, amitriptyline 75 mg, Abilify 5 mg and Klonopin 1 mg at bedtime.  Once we have the results available we will consider increasing his antidepressant.  Recommend to see therapist more frequently.  Patient at this time does not have any side effects of his medication.  Followup in 4 weeks.Time spent 25 minutes.  More than 50% of the time spent in psychoeducation, counseling and coordination  of care.  Discuss safety plan that anytime having active suicidal thoughts or homicidal thoughts then patient need to call 911 or go to the local emergency room.  ARFEEN,SYED T., MD 06/01/2013

## 2013-06-08 ENCOUNTER — Ambulatory Visit (HOSPITAL_COMMUNITY): Payer: Self-pay | Admitting: Psychiatry

## 2013-06-17 ENCOUNTER — Other Ambulatory Visit: Payer: Self-pay

## 2013-06-21 ENCOUNTER — Ambulatory Visit (INDEPENDENT_AMBULATORY_CARE_PROVIDER_SITE_OTHER): Payer: Medicare Other | Admitting: Psychiatry

## 2013-06-21 ENCOUNTER — Encounter (HOSPITAL_COMMUNITY): Payer: Self-pay | Admitting: Psychiatry

## 2013-06-21 DIAGNOSIS — F329 Major depressive disorder, single episode, unspecified: Secondary | ICD-10-CM

## 2013-06-21 DIAGNOSIS — F3289 Other specified depressive episodes: Secondary | ICD-10-CM

## 2013-06-21 NOTE — Progress Notes (Signed)
Patient ID: Dennis Conley, male   DOB: 1961/03/08, 52 y.o.   MRN: [id number]  Session Time: 8:00-8:50   Participation Level: Active   Behavioral Response: CasualAlertDysphoric  Type of Therapy: Individual Therapy   Treatment Goals addressed: communication skills, emotion regulation, stress management   Interventions: CBT   Summary: [name] is a 52 y.o. male who presents with depression.   Suicidal/Homicidal: Nowithout intent/plan   Therapist Response: Pt. Reports high level of stress and feeling overwhelmed. Pt. Reports that his eldest daughter and grandchildren moved from the home and Pt. Felt deep loss with their decision to leave. Around that time, Pt. Made the decision to move his elderly mother with dementia into the home. Pt. Enjoys his mother and provides for her caregiving, but his mother's presence seems to have added to marital distress. Session focused on reminding Pt. To focus on self-care behaviors including prayer and meditation, focus on his breath, and using heartmath learned in the outpatient program. Pt. Recommitted to coming to counseling sessions regularly.  Plan: Return again in 2 weeks.   Diagnosis: Axis I: Depressive Disorder NOS   Axis II: No diagnosis   Wynonia Musty  06/21/2013

## 2013-07-02 ENCOUNTER — Ambulatory Visit (INDEPENDENT_AMBULATORY_CARE_PROVIDER_SITE_OTHER): Payer: Medicare Other | Admitting: Psychiatry

## 2013-07-02 ENCOUNTER — Other Ambulatory Visit (HOSPITAL_COMMUNITY): Payer: Self-pay | Admitting: Psychiatry

## 2013-07-02 ENCOUNTER — Encounter (HOSPITAL_COMMUNITY): Payer: Self-pay | Admitting: Psychiatry

## 2013-07-02 VITALS — Wt 216.0 lb

## 2013-07-02 DIAGNOSIS — F339 Major depressive disorder, recurrent, unspecified: Secondary | ICD-10-CM

## 2013-07-02 DIAGNOSIS — F329 Major depressive disorder, single episode, unspecified: Secondary | ICD-10-CM

## 2013-07-02 DIAGNOSIS — Z79899 Other long term (current) drug therapy: Secondary | ICD-10-CM

## 2013-07-02 LAB — COMPREHENSIVE METABOLIC PANEL
ALT: 20 U/L (ref 0–53)
AST: 20 U/L (ref 0–37)
Alkaline Phosphatase: 82 U/L (ref 39–117)
Calcium: 9.6 mg/dL (ref 8.4–10.5)
Chloride: 100 mEq/L (ref 96–112)
Creat: 1.27 mg/dL (ref 0.50–1.35)

## 2013-07-02 MED ORDER — ARIPIPRAZOLE 5 MG PO TABS: 5.0000 mg | ORAL_TABLET | Freq: Every day | ORAL | Status: DC

## 2013-07-02 MED ORDER — CLONAZEPAM 1 MG PO TABS: 1.0000 mg | ORAL_TABLET | Freq: Every day | ORAL | Status: DC

## 2013-07-02 MED ORDER — AMITRIPTYLINE HCL 75 MG PO TABS: ORAL_TABLET | ORAL | Status: DC

## 2013-07-02 NOTE — Progress Notes (Addendum)
[facility] [id number] Progress Note  Dennis Conley [id number] 52 y.o.  07/02/2013 10:42 AM  Chief Complaint: Medication management and followup.  History of Present Illness: Dennis Conley came for his followup appointment.  On her last visit the order blood work.  His hemoglobin A1c is 6.3 which is increased from the past.  We have not received her comprehensive metabolic panel.  Patient is taking his current psychotropic medication.  He is taking Amitriptyline, Abilify, Cymbalta and Klonopin.  We had discussed that we would increase the medication if he have a blood results.  He has a high liver enzymes and his creatinine was 1.4 .  The patient admitted increased stress in the past recent months.  His thinking about separation from his wife.  He reported marriage is falling apart.  His mother recently moved in .  His brother is incarcerated for drug related charges and recently moved from [place] prison to [facility] prison.  He is hoping to see him .  Patient denies any suicidal thoughts or homicidal thoughts.  He admitted increased anxiety and nervousness however he is seeing Dennis Conley regularly.  He denies any paranoia or any hallucination.  He has no tremors or shakes.  Suicidal Ideation: No Plan Formed: No Patient has means to carry out plan: No  Homicidal Ideation: No Plan Formed: No Patient has means to carry out plan: No  Review of Systems: Psychiatric: Agitation: No Hallucination: No Depressed Mood: Yes Insomnia: Yes Hypersomnia: No Altered Concentration: No Feels Worthless: No Grandiose Ideas: No Belief In Special Powers: No New/Increased Substance Abuse: No Compulsions: No  Neurologic: Headache: Yes Seizure: No Paresthesias: Yes  Past Medical Family, Social History: Patient has history of chronic back pain, diabetic neuropathy, neck pain, back surgery, hypertension, benign prostatic hypertrophy, claudication, IBS.  He is taking multiple medication for his pain  and diabetes.  He lives with his wife and 5 children who are under 14.  He has lived in the past in [place] and be dressed.  He moved in [place] due to his job however he is currently disabled.  He is part working since August 2011.  Outpatient Encounter Prescriptions as of 07/02/2013  Medication Sig  . amitriptyline (ELAVIL) 75 MG tablet TAKE 1 TABLET BY MOUTH AT BEDTIME  . ARIPiprazole (ABILIFY) 5 MG tablet Take 1 tablet (5 mg total) by mouth daily.  . clonazePAM (KLONOPIN) 1 MG tablet Take 1 tablet (1 mg total) by mouth daily.  Marland Kitchen dicyclomine (BENTYL) 10 MG capsule Take 10 mg by mouth daily as needed. Bowls  . DULoxetine (CYMBALTA) 60 MG capsule Take 1 capsule (60 mg total) by mouth daily.  . fluticasone (FLOVENT HFA) 220 MCG/ACT inhaler 2 puffs swallowed  Twice daily.  Marland Kitchen gabapentin (NEURONTIN) 800 MG tablet   . JANUMET 50-1000 MG per tablet   . losartan (COZAAR) 100 MG tablet   . oxycodone (ROXICODONE) 30 MG immediate release tablet Take 30 mg by mouth 4 (four) times daily.   . rosuvastatin (CRESTOR) 40 MG tablet Take 40 mg by mouth daily.  . tadalafil (CIALIS) 5 MG tablet Take 1 tablet (5 mg total) by mouth daily as needed for erectile dysfunction.  . [DISCONTINUED] amitriptyline (ELAVIL) 75 MG tablet TAKE 1 TABLET BY MOUTH AT BEDTIME  . [DISCONTINUED] ARIPiprazole (ABILIFY) 5 MG tablet Take 1 tablet (5 mg total) by mouth daily.  . [DISCONTINUED] clonazePAM (KLONOPIN) 1 MG tablet Take 1 tablet (1 mg total) by mouth daily.  . RABEprazole (ACIPHEX) 20  MG tablet Take 1 tablet (20 mg total) by mouth daily.    Past Psychiatric History/Hospitalization(s): Anxiety: Yes Bipolar Disorder: No Depression: Yes Mania: No Psychosis: No Schizophrenia: No Personality Disorder: No Hospitalization for psychiatric illness: No History of Electroconvulsive Shock Therapy: No Prior Suicide Attempts: No  Physical Exam: Constitutional:  Wt 216 lb (97.977 kg)  General Appearance: alert,  oriented, no acute distress  Musculoskeletal: Strength & Muscle Tone: within normal limits Gait & Station: normal Patient leans: N/A  Psychiatric: Speech (describe rate, volume, coherence, spontaneity, and abnormalities if any): Soft.  The decreased volume and tone  Thought Process (describe rate, content, abstract reasoning, and computation): Logical and goal-directed  Associations: Relevant and Intact  Thoughts: Preoccupied with his depressive thoughts.  No delusion or paranoia.  Mental Status: Orientation: oriented to person, place, time/date, situation and day of week Mood & Affect: depressed affect and anxiety Attention Span & Concentration: Fair  Medical Decision Making (Choose Three): Established Problem, Stable/Improving (1), Review of Psycho-Social Stressors (1), Review or order clinical lab tests (1), Review of Last Therapy Session (1) and Review of Medication Regimen & Side Effects (2)  Assessment: Axis I: Maj. depressive disorder recurrent  Axis II: Deferred  Axis III:  Patient Active Problem List   Diagnosis Date Noted  . Syncope 02/02/2013  . Acute renal failure 02/02/2013  . Foot fracture, right 02/02/2013  . HTN (hypertension) 02/02/2013  . Diabetic neuropathy 12/15/2012  . Lumbar radiculopathy 12/15/2012  . Tachycardia 07/13/2012  . Elevated total protein 11/22/2011  . BPH (benign prostatic hyperplasia) 11/20/2011  . Eosinophilic esophagitis 08/09/2011  . Type II or unspecified type diabetes mellitus without mention of complication, uncontrolled 07/31/2011  . GERD (gastroesophageal reflux disease) 03/08/2011  . Essential hypertension, benign 01/23/2011  . Pure hypercholesterolemia 01/23/2011  . Chronic back pain 01/03/2011  . Preventative health care 01/02/2011  . ERECTILE DYSFUNCTION 10/29/2010  . DEPRESSION 10/29/2010  . OSTEOARTHRITIS 10/29/2010    Axis IV: Moderate  Axis V: 55-60   Plan:  We are still waiting for her as comprehensive  metabolic panel.  I review hemoglobin A1c and CBC results.  Consider increasing Cymbalta once we have liver enzymes.  Recommended to see therapist for coping and social skills.  Followup in 2 months.  I order again comprehensive metabolic panel.  Patient will do blood work and call us back .  Varina Hulon T., MD 07/02/2013

## 2013-07-14 ENCOUNTER — Other Ambulatory Visit: Payer: Self-pay | Admitting: Internal Medicine

## 2013-07-14 DIAGNOSIS — R109 Unspecified abdominal pain: Secondary | ICD-10-CM

## 2013-07-16 ENCOUNTER — Other Ambulatory Visit (HOSPITAL_COMMUNITY): Payer: Self-pay | Admitting: Psychiatry

## 2013-07-20 ENCOUNTER — Other Ambulatory Visit: Payer: Self-pay

## 2013-07-26 ENCOUNTER — Ambulatory Visit
Admission: RE | Admit: 2013-07-26 | Discharge: 2013-07-26 | Disposition: A | Discharge status: Home / Self Care | Payer: Medicare Other | Source: Ambulatory Visit | Attending: Internal Medicine | Admitting: Internal Medicine

## 2013-08-17 ENCOUNTER — Other Ambulatory Visit (HOSPITAL_COMMUNITY): Payer: Self-pay | Admitting: *Deleted

## 2013-08-17 DIAGNOSIS — F329 Major depressive disorder, single episode, unspecified: Secondary | ICD-10-CM

## 2013-08-17 MED ORDER — DULOXETINE HCL 30 MG PO CPEP: 30.0000 mg | ORAL_CAPSULE | Freq: Every day | ORAL | Status: DC

## 2013-08-17 NOTE — Telephone Encounter (Signed)
Recv'd refill request for Duloxetine 60 mg daily. Reviewed Chart: Per Dr.Arfeen last visit 07/02/13 note-May increase dose based on lab results. Per Dr. Sheela StackArfeen's order, called in new RX at increased dose of 90 mg daily. Left message for pt to contact office to discuss refill.

## 2013-08-23 ENCOUNTER — Encounter: Payer: Self-pay | Admitting: Internal Medicine

## 2013-09-01 ENCOUNTER — Ambulatory Visit (HOSPITAL_COMMUNITY): Payer: Self-pay | Admitting: Psychiatry

## 2013-09-02 ENCOUNTER — Other Ambulatory Visit: Payer: Self-pay | Admitting: Orthopedic Surgery

## 2013-09-02 ENCOUNTER — Other Ambulatory Visit (HOSPITAL_COMMUNITY): Payer: Self-pay | Admitting: Psychiatry

## 2013-09-02 DIAGNOSIS — M545 Low back pain, unspecified: Secondary | ICD-10-CM

## 2013-09-09 ENCOUNTER — Other Ambulatory Visit: Payer: Self-pay

## 2013-09-13 ENCOUNTER — Other Ambulatory Visit (HOSPITAL_COMMUNITY): Payer: Self-pay | Admitting: Psychiatry

## 2013-09-14 ENCOUNTER — Other Ambulatory Visit (HOSPITAL_COMMUNITY): Payer: Self-pay | Admitting: *Deleted

## 2013-09-14 DIAGNOSIS — F329 Major depressive disorder, single episode, unspecified: Secondary | ICD-10-CM

## 2013-09-14 MED ORDER — ARIPIPRAZOLE 5 MG PO TABS
5.0000 mg | ORAL_TABLET | Freq: Every day | ORAL | Status: DC
Start: 2013-09-14 — End: 2013-11-10

## 2013-09-14 NOTE — Telephone Encounter (Signed)
Refill request received for Abilify. 30 day supply authorized by Dr. Lolly MustacheArfeen

## 2013-09-16 ENCOUNTER — Encounter (HOSPITAL_COMMUNITY): Payer: Self-pay | Admitting: Psychiatry

## 2013-09-16 ENCOUNTER — Ambulatory Visit (INDEPENDENT_AMBULATORY_CARE_PROVIDER_SITE_OTHER): Payer: Medicare Other | Admitting: Psychiatry

## 2013-09-16 VITALS — BP 142/84 | HR 114 | Ht 74.0 in | Wt 210.6 lb

## 2013-09-16 DIAGNOSIS — F329 Major depressive disorder, single episode, unspecified: Secondary | ICD-10-CM

## 2013-09-16 DIAGNOSIS — F339 Major depressive disorder, recurrent, unspecified: Secondary | ICD-10-CM

## 2013-09-16 MED ORDER — DULOXETINE HCL 30 MG PO CPEP: ORAL_CAPSULE | ORAL | Status: DC

## 2013-09-16 MED ORDER — AMITRIPTYLINE HCL 100 MG PO TABS: ORAL_TABLET | ORAL | Status: DC

## 2013-09-16 MED ORDER — CLONAZEPAM 1 MG PO TABS: 1.0000 mg | ORAL_TABLET | Freq: Every day | ORAL | Status: DC

## 2013-09-16 NOTE — Progress Notes (Signed)
[facility] [id number] Progress Note  ROBERTS BON [id number] 53 y.o.  09/16/2013 2:11 PM  Chief Complaint: Medication management and followup.  History of Present Illness: Dennis Conley came for his followup appointment.  He missed last appointment.  Patient endorsed increased stress at his home.  His 41 year old mother who has to be worried dementia is living with him.  Patient told mother has lot of behavioral issues .  His wife is very concerned and not cooperating.  Recently his wife told him that she might get divorced .  The patient does not want to leave his mother.  Patient admitted increased anxiety, irritability, social isolation , feeling overwhelmed and increased depression.  He denies any suicidal thoughts or homicidal thoughts but admitted chronic feeling of hopelessness and lack of energy.  He has poor sleep .  He has racing thoughts and he thinks about his mother all the time.  He has no other support.  He denies any tremors or shakes.  We received records from his nephrologist.  His renal ultrasound does not show any hydronephrosis.  His creatinine is better.  His liver function test is also getting better.  He reduced Crestor does and taking only 20 mg.  His hemoglobin A1c is 6.3.  His CBC is normal.  His creatinine dropped to 1.27.  Patient denies any mania or any psychosis.  He is seeing Victorino Dike however he has missed appointment in the past but scheduled again in March.  He is not drinking or using any illegal substances.  He has no tremors or shakes.  Suicidal Ideation: No Plan Formed: No Patient has means to carry out plan: No  Homicidal Ideation: No Plan Formed: No Patient has means to carry out plan: No  Review of Systems: Psychiatric: Agitation: No Hallucination: No Depressed Mood: Yes Insomnia: Yes Hypersomnia: No Altered Concentration: No Feels Worthless: No Grandiose Ideas: No Belief In Special Powers: No New/Increased Substance Abuse: No Compulsions:  No  Neurologic: Headache: Yes Seizure: No Paresthesias: Yes  Past Medical Family, Social History: Patient has history of chronic back pain, diabetic neuropathy, neck pain, back surgery, hypertension, benign prostatic hypertrophy, claudication, IBS.  He is taking multiple medication for his pain and diabetes.  His primary care physician is Dr. Shary Decamp and [facility]  He lives with his wife and 5 children who are under 14.  Recently his mother moved in.  He has lived in the past in [place]. He moved [place] due to his job however he is currently disabled.  He is not working since August 2011.  Outpatient Encounter Prescriptions as of 09/16/2013  Medication Sig  . amitriptyline (ELAVIL) 100 MG tablet TAKE 1 TABLET BY MOUTH AT BEDTIME  . ARIPiprazole (ABILIFY) 5 MG tablet Take 1 tablet (5 mg total) by mouth daily.  . clonazePAM (KLONOPIN) 1 MG tablet Take 1 tablet (1 mg total) by mouth daily.  Marland Kitchen dicyclomine (BENTYL) 10 MG capsule Take 10 mg by mouth daily as needed. Bowls  . DULoxetine (CYMBALTA) 30 MG capsule Take 3 capsule daily  . fluticasone (FLOVENT HFA) 220 MCG/ACT inhaler 2 puffs swallowed  Twice daily.  Marland Kitchen gabapentin (NEURONTIN) 800 MG tablet   . JANUMET 50-1000 MG per tablet   . losartan (COZAAR) 100 MG tablet   . rosuvastatin (CRESTOR) 40 MG tablet Take 40 mg by mouth daily.  . tadalafil (CIALIS) 5 MG tablet Take 1 tablet (5 mg total) by mouth daily as needed  for erectile dysfunction.  . [DISCONTINUED] amitriptyline (ELAVIL) 75 MG tablet TAKE 1 TABLET BY MOUTH AT BEDTIME  . [DISCONTINUED] clonazePAM (KLONOPIN) 1 MG tablet Take 1 tablet (1 mg total) by mouth daily.  . [DISCONTINUED] clonazePAM (KLONOPIN) 1 MG tablet Take 1 tablet (1 mg total) by mouth daily.  . [DISCONTINUED] DULoxetine (CYMBALTA) 30 MG capsule Take 1 capsule (30 mg total) by mouth daily.  . [DISCONTINUED] DULoxetine (CYMBALTA) 30 MG capsule Take 3 capsule daily  . oxycodone  (ROXICODONE) 30 MG immediate release tablet Take 30 mg by mouth 4 (four) times daily.   . RABEprazole (ACIPHEX) 20 MG tablet Take 1 tablet (20 mg total) by mouth daily.    Past Psychiatric History/Hospitalization(s): Anxiety: Yes Bipolar Disorder: No Depression: Yes Mania: No Psychosis: No Schizophrenia: No Personality Disorder: No Hospitalization for psychiatric illness: No History of Electroconvulsive Shock Therapy: No Prior Suicide Attempts: No  Physical Exam: Constitutional:  BP 142/84  Pulse 114  Ht 6\' 2"  (1.88 m)  Wt 210 lb 9.6 oz (95.528 kg)  BMI 27.03 kg/m2  General Appearance: He appears to be in his stated age.  He is casually dressed.  He maintained fair eye contact.  Musculoskeletal: Strength & Muscle Tone: within normal limits Gait & Station: normal Patient leans: N/A and Patient has a normal posture  Psychiatric: Speech (describe rate, volume, coherence, spontaneity, and abnormalities if any): Soft and clear.  He has nonspontaneous speech with decreased volume and tone.  Thought Process (describe rate, content, abstract reasoning, and computation): Logical and goal-directed  Associations: Relevant and Intact  Thoughts: Preoccupied with his depressive thoughts.  No delusion or paranoia., referred of knowledge is adequate.  Mental Status: Orientation: oriented to person, place, time/date, situation and day of week Mood & Affect: depressed affect and anxiety Attention Span & Concentration: Fair  Established Problem, Stable/Improving (1), Review of Psycho-Social Stressors (1), Review or order clinical lab tests (1), Review and summation of old records (2), Established Problem, Worsening (2), Review of Last Therapy Session (1), Review of Medication Regimen & Side Effects (2) and Review of New Medication or Change in Dosage (2)  Assessment: Axis I: Maj. depressive disorder recurrent  Axis II: Deferred  Axis III:  Patient Active Problem List   Diagnosis  Date Noted  . Syncope 02/02/2013  . Acute renal failure 02/02/2013  . Foot fracture, right 02/02/2013  . HTN (hypertension) 02/02/2013  . Diabetic neuropathy 12/15/2012  . Lumbar radiculopathy 12/15/2012  . Tachycardia 07/13/2012  . Elevated total protein 11/22/2011  . BPH (benign prostatic hyperplasia) 11/20/2011  . Eosinophilic esophagitis 08/09/2011  . Type II or unspecified type diabetes mellitus without mention of complication, uncontrolled 07/31/2011  . GERD (gastroesophageal reflux disease) 03/08/2011  . Essential hypertension, benign 01/23/2011  . Pure hypercholesterolemia 01/23/2011  . Chronic back pain 01/03/2011  . Preventative health care 01/02/2011  . ERECTILE DYSFUNCTION 10/29/2010  . DEPRESSION 10/29/2010  . OSTEOARTHRITIS 10/29/2010    Axis IV: Moderate  Axis V: 55-60   Plan:  I reviewed the records from his nephrologist, recent blood work results and his current medication.  Patient is feeling more depressed due to his psychosocial issues.  In the past he used to take amitriptyline 100 mg .  It was reduced because she has high liver enzymes and mild elevation in creatinine.  Now his blood results are better.  I will increase amitriptyline from 75 mg to 100 mg.  Daily Abilify 5 mg daily, Cymbalta 90 mg daily.  Patient is also  taking multiple pain medication.  Discuss in detail the risks and benefits of medication including polypharmacy and drug drug interaction.  Recommend to continue counseling with Boneta Lucks.  Patient is scheduled to see her in March.  Followup in 2 months.  Recommend to call us back if he has any question or any concern.  Time spent 25 minutes.  More than 50% of the time spent in psychoeducation, counseling and coordination of care.  Discuss safety plan that anytime having active suicidal thoughts or homicidal thoughts then patient need to call 911 or go to the local emergency room.   Dennis Conley T., MD 09/16/2013

## 2013-09-27 ENCOUNTER — Ambulatory Visit
Admission: RE | Admit: 2013-09-27 | Discharge: 2013-09-27 | Disposition: A | Discharge status: Home / Self Care | Payer: Medicare Other | Source: Ambulatory Visit | Attending: Orthopedic Surgery | Admitting: Orthopedic Surgery

## 2013-09-27 DIAGNOSIS — M545 Low back pain, unspecified: Secondary | ICD-10-CM

## 2013-09-27 MED ORDER — DIAZEPAM 5 MG PO TABS
10.0000 mg | ORAL_TABLET | Freq: Once | ORAL | Status: AC
  Administered 2013-09-27: 10 mg via ORAL

## 2013-09-27 MED ORDER — IOHEXOL 180 MG/ML  SOLN
18.0000 mL | Freq: Once | INTRAMUSCULAR | Status: AC | PRN
  Administered 2013-09-27: 18 mL via INTRATHECAL

## 2013-09-27 NOTE — Discharge Instructions (Signed)
Myelogram Discharge Instructions  1. Go home and rest quietly for the next 24 hours.  It is important to lie flat for the next 24 hours.  Get up only to go to the restroom.  You may lie in the bed or on a couch on your back, your stomach, your left side or your right side.  You may have one pillow under your head.  You may have pillows between your knees while you are on your side or under your knees while you are on your back.  2. DO NOT drive today.  Recline the seat as far back as it will go, while still wearing your seat belt, on the way home.  3. You may get up to go to the bathroom as needed.  You may sit up for 10 minutes to eat.  You may resume your normal diet and medications unless otherwise indicated.  Drink lots of extra fluids today and tomorrow.  4. The incidence of headache, nausea, or vomiting is about 5% (one in 20 patients).  If you develop a headache, lie flat and drink plenty of fluids until the headache goes away.  Caffeinated beverages may be helpful.  If you develop severe nausea and vomiting or a headache that does not go away with flat bed rest, call [phone].  5. You may resume normal activities after your 24 hours of bed rest is over; however, do not exert yourself strongly or do any heavy lifting tomorrow. If when you get up you have a headache when standing, go back to bed and force fluids for another 24 hours.  6. Call your physician for a follow-up appointment.  The results of your myelogram will be sent directly to your physician by the following day.  7. If you have any questions or if complications develop after you arrive home, please call [phone].  Discharge instructions have been explained to the patient.  The patient, or the person responsible for the patient, fully understands these instructions.      May resume cymabalta, amitriptyline, and abilify on Feb. 17, 2015 after 11:00 am.

## 2013-09-27 NOTE — Progress Notes (Signed)
Patient states he has been off Cymbalta, Abilify and Amitriptyline for at least the past two days.  jkl

## 2013-09-28 ENCOUNTER — Telehealth (HOSPITAL_COMMUNITY): Payer: Self-pay | Admitting: *Deleted

## 2013-09-28 NOTE — Telephone Encounter (Signed)
Contacted [employer] @ [phone]. [place]. Intiated Prior Authorization for Quantity Limit Exception for Duloxetine DR,30 mg, #90, 3/day.

## 2013-09-29 ENCOUNTER — Encounter (HOSPITAL_COMMUNITY): Payer: Self-pay | Admitting: *Deleted

## 2013-09-29 NOTE — Progress Notes (Signed)
Duloxetine 30 mg, #90, [facility] Effective 09/28/13 thru 09/28/14 Patient and pharmacy notified

## 2013-10-19 ENCOUNTER — Ambulatory Visit: Payer: Self-pay | Admitting: Pain Medicine

## 2013-11-01 ENCOUNTER — Ambulatory Visit (HOSPITAL_COMMUNITY): Payer: Self-pay | Admitting: Psychiatry

## 2013-11-10 ENCOUNTER — Ambulatory Visit (INDEPENDENT_AMBULATORY_CARE_PROVIDER_SITE_OTHER): Payer: 59 | Admitting: Psychiatry

## 2013-11-10 ENCOUNTER — Encounter (HOSPITAL_COMMUNITY): Payer: Self-pay | Admitting: Psychiatry

## 2013-11-10 VITALS — BP 136/102 | HR 100 | Ht 74.0 in | Wt 218.0 lb

## 2013-11-10 DIAGNOSIS — F339 Major depressive disorder, recurrent, unspecified: Secondary | ICD-10-CM

## 2013-11-10 DIAGNOSIS — F329 Major depressive disorder, single episode, unspecified: Secondary | ICD-10-CM

## 2013-11-10 MED ORDER — AMITRIPTYLINE HCL 75 MG PO TABS: ORAL_TABLET | ORAL | Status: DC

## 2013-11-10 MED ORDER — DULOXETINE HCL 30 MG PO CPEP: ORAL_CAPSULE | ORAL | Status: AC

## 2013-11-10 MED ORDER — CLONAZEPAM 1 MG PO TABS: 1.0000 mg | ORAL_TABLET | Freq: Two times a day (BID) | ORAL | Status: DC

## 2013-11-10 MED ORDER — ARIPIPRAZOLE 5 MG PO TABS: 5.0000 mg | ORAL_TABLET | Freq: Every day | ORAL | Status: DC

## 2013-11-10 NOTE — Progress Notes (Signed)
[facility] [id number] Progress Note  Dennis Conley [id number] 53 y.o.  11/10/2013 2:37 PM  Chief Complaint: I mother died a few days ago.  I have difficulty falling asleep.    History of Present Illness: Dennis Conley came for his followup appointment.  He appears very sad and depressed.  His mother died on the 27th .  Patient told his mother!s health started to deteriorated one week prior to her death.  He admitted to past few days he has unable to sleep.  He is very anxious, tearful, crying spells and having racing thoughts.  Is only sleeping one to 2 hours.  He is taking his medication but he feels medicine is not working.  His wife is supportive and she decided to stay in the marriage .  Patient has missed appointment with a therapist because he wanted to stay with her mother that day.  Patient denies any suicidal thoughts or homicidal thought but appears very sad depressed with flat affect.  [place] .  Patient is also thinking about counseling from the hospice once he returned from funeral services.  He denies any paranoia or any hallucination.  He denies any agitation or any anger.  He admitted crying spells but denies any feeling of hopelessness or worthlessness.  He feels sometime he retired with lack of energy and no motivation.  We have reviewed his blood results.  His crack and is dropped to 1.27.  Today patient's blood pressure was slightly high but he denies any chest pain or any shortness of breath.  Patient believes lack of sleep is making him more anxious.  He is taking Klonopin 1 mg in the morning.  He is taking Abilify and nortriptyline which was increased on his last visit.  Patient does not see any improvement with increase nortriptyline.  He has gained weight from the past.  He does not recall his appetite is increased but feels sometime no energy to do anything stated  Suicidal Ideation: No Plan Formed: No Patient has  means to carry out plan: No  Homicidal Ideation: No Plan Formed: No Patient has means to carry out plan: No  Review of Systems: Psychiatric: Agitation: No Hallucination: No Depressed Mood: Yes Insomnia: Yes Hypersomnia: No Altered Concentration: No Feels Worthless: No Grandiose Ideas: No Belief In Special Powers: No New/Increased Substance Abuse: No Compulsions: No  Neurologic: Headache: Yes Seizure: No Paresthesias: Yes  Past Medical Family, Social History: Patient has history of chronic back pain, diabetic neuropathy, neck pain, back surgery, hypertension, benign prostatic hypertrophy, claudication, IBS.  He is taking multiple medication for his pain and diabetes.  His primary care physician is Dr. Shary DecampBrian McKenzie and [facility]  He lives with his wife and 5 children who are under 14.  Recently his mother moved in.  He has lived in the past in [place]. [place] due to his job however he is currently disabled.  He is not working since August 2011.  Outpatient Encounter Prescriptions as of 11/10/2013  Medication Sig  . amitriptyline (ELAVIL) 75 MG tablet TAKE 1 TABLET BY MOUTH AT BEDTIME  . ARIPiprazole (ABILIFY) 5 MG tablet Take 1 tablet (5 mg total) by mouth daily.  . clonazePAM (KLONOPIN) 1 MG tablet Take 1 tablet (1 mg total) by mouth 2 (two) times daily.  Marland Kitchen. dicyclomine (BENTYL) 10 MG capsule Take 10 mg by mouth daily as needed. Bowls  .  DULoxetine (CYMBALTA) 30 MG capsule Take 3 capsule daily  . fluticasone (FLOVENT HFA) 220 MCG/ACT inhaler 2 puffs swallowed  Twice daily.  Marland Kitchen gabapentin (NEURONTIN) 800 MG tablet   . JANUMET 50-1000 MG per tablet   . losartan (COZAAR) 100 MG tablet   . oxycodone (ROXICODONE) 30 MG immediate release tablet Take 30 mg by mouth 4 (four) times daily.   . RABEprazole (ACIPHEX) 20 MG tablet Take 1 tablet (20 mg total) by mouth daily.  . rosuvastatin (CRESTOR) 40 MG tablet Take 40 mg by mouth daily.  .  tadalafil (CIALIS) 5 MG tablet Take 1 tablet (5 mg total) by mouth daily as needed for erectile dysfunction.  . [DISCONTINUED] amitriptyline (ELAVIL) 100 MG tablet TAKE 1 TABLET BY MOUTH AT BEDTIME  . [DISCONTINUED] ARIPiprazole (ABILIFY) 5 MG tablet Take 1 tablet (5 mg total) by mouth daily.  . [DISCONTINUED] clonazePAM (KLONOPIN) 1 MG tablet Take 1 tablet (1 mg total) by mouth daily.  . [DISCONTINUED] DULoxetine (CYMBALTA) 30 MG capsule Take 3 capsule daily    Past Psychiatric History/Hospitalization(s): Anxiety: Yes Bipolar Disorder: No Depression: Yes Mania: No Psychosis: No Schizophrenia: No Personality Disorder: No Hospitalization for psychiatric illness: No History of Electroconvulsive Shock Therapy: No Prior Suicide Attempts: No  Physical Exam: Constitutional:  BP 136/102  Pulse 100  Ht 6\' 2"  (1.88 m)  Wt 218 lb (98.884 kg)  BMI 27.98 kg/m2  Musculoskeletal: Strength & Muscle Tone: within normal limits Gait & Station: normal Patient leans: N/A and Patient has a normal posture  Mental status examination Patient is casually dressed and fairly groomed.  He appears very anxious and depressed.  His affect is constricted.  His thought processes slow but logical and goal directed.  His speech is slow with decreased volume and tone.  He denies any active or passive suicidal thoughts or homicidal thoughts.  He denies any auditory or visual hallucination.  There were no paranoia or delusions.  His psychomotor activity is slightly decreased.  His fund of knowledge is adequate.  His cognition is intact.  There were no flight of ideas or any loose association.  His attention and concentration is fair.  There were no tremors or shakes.  He is alert and oriented x3.  His insight judgment and impulse control is okay.  Established Problem, Stable/Improving (1), New problem, with additional work up planned, Review of Psycho-Social Stressors (1), Established Problem, Worsening (2), Review of  Last Therapy Session (1), Review of Medication Regimen & Side Effects (2) and Review of New Medication or Change in Dosage (2)  Assessment: Axis I: Maj. depressive disorder recurrent  Axis II: Deferred  Axis III:  Patient Active Problem List   Diagnosis Date Noted  . Syncope 02/02/2013  . Acute renal failure 02/02/2013  . Foot fracture, right 02/02/2013  . HTN (hypertension) 02/02/2013  . Diabetic neuropathy 12/15/2012  . Lumbar radiculopathy 12/15/2012  . Tachycardia 07/13/2012  . Elevated total protein 11/22/2011  . BPH (benign prostatic hyperplasia) 11/20/2011  . Eosinophilic esophagitis 08/09/2011  . Type II or unspecified type diabetes mellitus without mention of complication, uncontrolled 07/31/2011  . GERD (gastroesophageal reflux disease) 03/08/2011  . Essential hypertension, benign 01/23/2011  . Pure hypercholesterolemia 01/23/2011  . Chronic back pain 01/03/2011  . Preventative health care 01/02/2011  . ERECTILE DYSFUNCTION 10/29/2010  . DEPRESSION 10/29/2010  . OSTEOARTHRITIS 10/29/2010    Axis IV: Moderate  Axis V: 55-60   Plan:  Reassurance given.  The patient is going through grief because of  the loss of his mother.  He is unable to sleep.  I will increase Klonopin 1 mg twice a day for a short period of time .  I will reduce his nortriptyline to 75 mg since he has not seen any improvement with 100 mg.  At any Abilify at present dose.  Strongly recommended to see hospice counseling once he returned from their services .  Recommended to call us back if there is any question or any concern.  I will see him again in 4 weeks.  Time spent 25 minutes.  More than 50% of the time spent in psychoeducation, counseling and coordination of care.  Discuss safety plan that anytime having active suicidal thoughts or homicidal thoughts then patient need to call 911 or go to the local emergency room.   ARFEEN,SYED T., MD 11/10/2013

## 2013-12-08 ENCOUNTER — Encounter (HOSPITAL_COMMUNITY): Payer: Self-pay | Admitting: Psychiatry

## 2013-12-08 ENCOUNTER — Ambulatory Visit (INDEPENDENT_AMBULATORY_CARE_PROVIDER_SITE_OTHER): Payer: 59 | Admitting: Psychiatry

## 2013-12-08 VITALS — BP 135/99 | HR 105 | Ht 74.0 in | Wt 219.6 lb

## 2013-12-08 DIAGNOSIS — F339 Major depressive disorder, recurrent, unspecified: Secondary | ICD-10-CM

## 2013-12-08 DIAGNOSIS — F329 Major depressive disorder, single episode, unspecified: Secondary | ICD-10-CM

## 2013-12-08 MED ORDER — CLONAZEPAM 1 MG PO TABS: 1.0000 mg | ORAL_TABLET | Freq: Two times a day (BID) | ORAL | Status: AC

## 2013-12-08 MED ORDER — ARIPIPRAZOLE 5 MG PO TABS: 5.0000 mg | ORAL_TABLET | Freq: Every day | ORAL | Status: AC

## 2013-12-08 MED ORDER — AMITRIPTYLINE HCL 75 MG PO TABS: ORAL_TABLET | ORAL | Status: AC

## 2013-12-08 NOTE — Progress Notes (Signed)
[facility] [id number] Progress Note  Dennis Conley [id number] 53 y.o.  12/08/2013 10:02 AM  Chief Complaint:  I am feeling better with medication.      History of Present Illness: Dennis Conley came for his followup appointment.   On his last visit we reduced his amitriptyline and increase his Klonopin because he was complaining of anxiety and nervousness.  He was not sleeping better.  His mother died on March 27 and he was going through grief.  Patient is feeling somewhat better.  He is able to sleep.  He denies any recent panic attack but continued to have some time crying spells when he missed his mother.  He went to [place] and he had good support from his family.  Patient also scheduled to see hospice for counseling.  His appointment is on May 6.  Patient reported less intense anxiety and depressive symptoms.  He is compliant with his medication and denies any side effects.  He denies any hallucination or any paranoia.  He is very happy because his wife is very supportive.  Patient denies any agitation anger or any mood swing.  He is not drinking alcohol or using any illicit substances.  His appetite is okay.  His vitals are stable.  Suicidal Ideation: No Plan Formed: No Patient has means to carry out plan: No  Homicidal Ideation: No Plan Formed: No Patient has means to carry out plan: No  Review of Systems: Psychiatric: Agitation: No Hallucination: No Depressed Mood: No Insomnia: No Hypersomnia: No Altered Concentration: No Feels Worthless: No Grandiose Ideas: No Belief In Special Powers: No New/Increased Substance Abuse: No Compulsions: No  Neurologic: Headache: Yes Seizure: No Paresthesias: Yes  Past Medical Family, Social History: Patient has history of chronic back pain, diabetic neuropathy, neck pain, back surgery, hypertension, benign prostatic hypertrophy, claudication, IBS.  He is taking multiple medication for his pain and diabetes.  His primary  care physician is Dr. Shary DecampBrian McKenzie and [facility]  He lives with his wife and 5 children. He has lived in the past in [place]. [place] due to [place] job however he is currently disabled.  He is not working since August 2011.  Outpatient Encounter Prescriptions as of 12/08/2013  Medication Sig  . amitriptyline (ELAVIL) 75 MG tablet TAKE 1 TABLET BY MOUTH AT BEDTIME  . ARIPiprazole (ABILIFY) 5 MG tablet Take 1 tablet (5 mg total) by mouth daily.  . clonazePAM (KLONOPIN) 1 MG tablet Take 1 tablet (1 mg total) by mouth 2 (two) times daily.  . [DISCONTINUED] amitriptyline (ELAVIL) 75 MG tablet TAKE 1 TABLET BY MOUTH AT BEDTIME  . [DISCONTINUED] ARIPiprazole (ABILIFY) 5 MG tablet Take 1 tablet (5 mg total) by mouth daily.  . [DISCONTINUED] clonazePAM (KLONOPIN) 1 MG tablet Take 1 tablet (1 mg total) by mouth 2 (two) times daily.  Marland Kitchen. dicyclomine (BENTYL) 10 MG capsule Take 10 mg by mouth daily as needed. Bowls  . DULoxetine (CYMBALTA) 30 MG capsule Take 3 capsule daily  . fluticasone (FLOVENT HFA) 220 MCG/ACT inhaler 2 puffs swallowed  Twice daily.  Marland Kitchen. gabapentin (NEURONTIN) 800 MG tablet   . JANUMET 50-1000 MG per tablet   . losartan (COZAAR) 100 MG tablet   . oxycodone (ROXICODONE) 30 MG immediate release tablet Take 30 mg by mouth 4 (four) times daily.   . RABEprazole (ACIPHEX) 20 MG tablet Take 1 tablet (20 mg total) by mouth daily.  . rosuvastatin (CRESTOR) 40  MG tablet Take 40 mg by mouth daily.  . tadalafil (CIALIS) 5 MG tablet Take 1 tablet (5 mg total) by mouth daily as needed for erectile dysfunction.    Past Psychiatric History/Hospitalization(s): Anxiety: Yes Bipolar Disorder: No Depression: Yes Mania: No Psychosis: No Schizophrenia: No Personality Disorder: No Hospitalization for psychiatric illness: No History of Electroconvulsive Shock Therapy: No Prior Suicide Attempts: No  Physical Exam: Constitutional:  BP 135/99  Pulse 105   Ht 6\' 2"  (1.88 m)  Wt 219 lb 9.6 oz (99.61 kg)  BMI 28.18 kg/m2  Musculoskeletal: Strength & Muscle Tone: within normal limits Gait & Station: normal Patient leans: N/A and Patient has a normal posture  Mental status examination Patient is casually dressed and fairly groomed.  He appears calm and cooperative.  He described his mood is better and his affect is improved from the past.  His thought processes slow but logical and goal directed.  His speech is slow  but clear and coherent.  He denies any active or passive suicidal thoughts or homicidal thoughts.  He denies any auditory or visual hallucination.  There were no paranoia or delusions.  His psychomotor activity is slightly decreased.  His fund of knowledge is adequate.  His cognition is intact.  There were no flight of ideas or any loose association.  His attention and concentration is fair.  There were no tremors or shakes.  He is alert and oriented x3.  His insight judgment and impulse control is okay.  Established Problem, Stable/Improving (1), Review of Psycho-Social Stressors (1), Review of Last Therapy Session (1) and Review of Medication Regimen & Side Effects (2)  Assessment: Axis I: Maj. depressive disorder recurrent  Axis II: Deferred  Axis III:  Patient Active Problem List   Diagnosis Date Noted  . Syncope 02/02/2013  . Acute renal failure 02/02/2013  . Foot fracture, right 02/02/2013  . HTN (hypertension) 02/02/2013  . Diabetic neuropathy 12/15/2012  . Lumbar radiculopathy 12/15/2012  . Tachycardia 07/13/2012  . Elevated total protein 11/22/2011  . BPH (benign prostatic hyperplasia) 11/20/2011  . Eosinophilic esophagitis 08/09/2011  . Type II or unspecified type diabetes mellitus without mention of complication, uncontrolled 07/31/2011  . GERD (gastroesophageal reflux disease) 03/08/2011  . Essential hypertension, benign 01/23/2011  . Pure hypercholesterolemia 01/23/2011  . Chronic back pain 01/03/2011  .  Preventative health care 01/02/2011  . ERECTILE DYSFUNCTION 10/29/2010  . DEPRESSION 10/29/2010  . OSTEOARTHRITIS 10/29/2010    Axis IV: Moderate  Axis V: 55-60   Plan:  Patient is doing better on his medication.  Recommended to keep appointment with hospice on May 6 for counseling.  Recommended to keep appointment with his nephrologist .  I will continue Abilify 5 mg daily, Klonopin 1 mg twice a day and amitriptyline 75 mg at bedtime.  Recommended to call us back if he has any questions.  I will see him again in 2 months.  Asa Fath T., MD 12/08/2013

## 2014-02-02 ENCOUNTER — Other Ambulatory Visit (HOSPITAL_COMMUNITY): Payer: Self-pay | Admitting: Internal Medicine

## 2014-02-02 ENCOUNTER — Encounter (HOSPITAL_COMMUNITY): Payer: Self-pay

## 2014-02-02 DIAGNOSIS — R0602 Shortness of breath: Secondary | ICD-10-CM

## 2014-02-07 ENCOUNTER — Telehealth (HOSPITAL_COMMUNITY): Payer: Self-pay | Admitting: *Deleted

## 2014-02-07 ENCOUNTER — Ambulatory Visit (HOSPITAL_COMMUNITY): Payer: Self-pay | Admitting: Psychiatry

## 2014-02-08 ENCOUNTER — Encounter (HOSPITAL_COMMUNITY): Payer: Self-pay | Admitting: *Deleted

## 2014-02-08 ENCOUNTER — Telehealth (HOSPITAL_COMMUNITY): Payer: Self-pay | Admitting: *Deleted

## 2014-02-08 NOTE — Telephone Encounter (Signed)
Initiated Prior Authroization for Duloxetine 30 mg - 3capsules/day. Quantity override:Insurance allows only 2 capsules a day without PA. 90mg  not commercially available. Must give 3 - 30 mg capsules. Request pended to pharamcy for review.

## 2014-02-09 NOTE — Progress Notes (Signed)
Duloxetine 30 mg - 3/day authorized thru [employer]  Effective 02/08/14 to 08/11/14 PA# [id number] Pharmacy notified by fax

## 2014-02-14 ENCOUNTER — Telehealth (HOSPITAL_COMMUNITY): Payer: Self-pay | Admitting: *Deleted

## 2014-02-16 ENCOUNTER — Other Ambulatory Visit: Payer: Self-pay | Admitting: Nephrology

## 2014-02-16 ENCOUNTER — Ambulatory Visit
Admission: RE | Admit: 2014-02-16 | Discharge: 2014-02-16 | Disposition: A | Discharge status: Home / Self Care | Payer: Medicare Other | Source: Ambulatory Visit | Attending: Nephrology | Admitting: Nephrology

## 2014-02-16 DIAGNOSIS — N183 Chronic kidney disease, stage 3 unspecified: Secondary | ICD-10-CM

## 2014-02-18 ENCOUNTER — Other Ambulatory Visit: Payer: Self-pay

## 2014-02-22 ENCOUNTER — Ambulatory Visit (HOSPITAL_COMMUNITY): Payer: Self-pay | Admitting: Psychiatry

## 2014-02-24 ENCOUNTER — Encounter: Payer: Self-pay | Admitting: Internal Medicine

## 2014-03-04 ENCOUNTER — Telehealth (HOSPITAL_COMMUNITY): Payer: Self-pay

## 2014-03-04 NOTE — Telephone Encounter (Signed)
02/22/14 Patient and family has moved to [place] per conversation with patient.Marland Kitchen.[name]/sh

## 2014-03-05 ENCOUNTER — Other Ambulatory Visit (HOSPITAL_COMMUNITY): Payer: Self-pay | Admitting: Psychiatry

## 2014-03-08 ENCOUNTER — Other Ambulatory Visit (HOSPITAL_COMMUNITY): Payer: Self-pay | Admitting: Psychiatry

## 2014-03-08 NOTE — Telephone Encounter (Signed)
[place] of [place].

## 2014-03-10 IMAGING — RF DG MYELOGRAM LUMBAR
13 of 14 series · 13 of 14 positions shown · non-contrast
Comparison: 11/09/2010

CLINICAL DATA: Low back pain.  Bilateral leg pain.

EXAM:
LUMBAR MYELOGRAM
TECHNIQUE: Contiguous axial images were obtained through the Lumbar spine after
the intrathecal infusion of infusion. Coronal and sagittal
reconstructions were obtained of the axial image sets.

[Series 1: (hospital) · 1 of 1 slices shown]
[im 1/1]
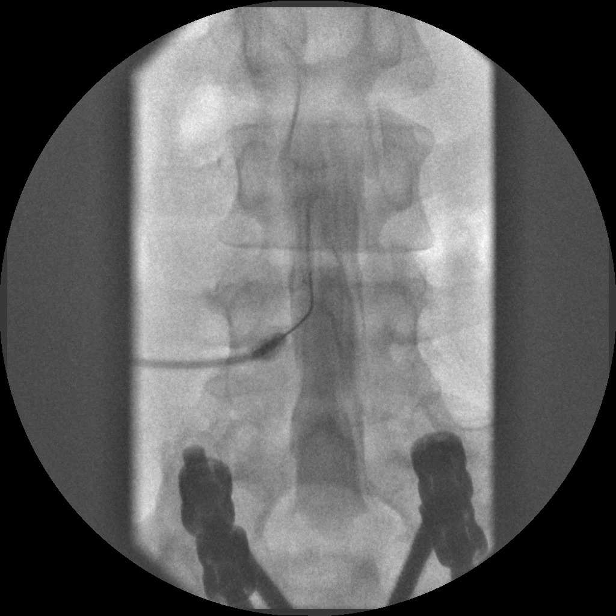

[Series 2: myelogram  white · 1 of 1 slices shown (1 of 9)]
[im 1/1]
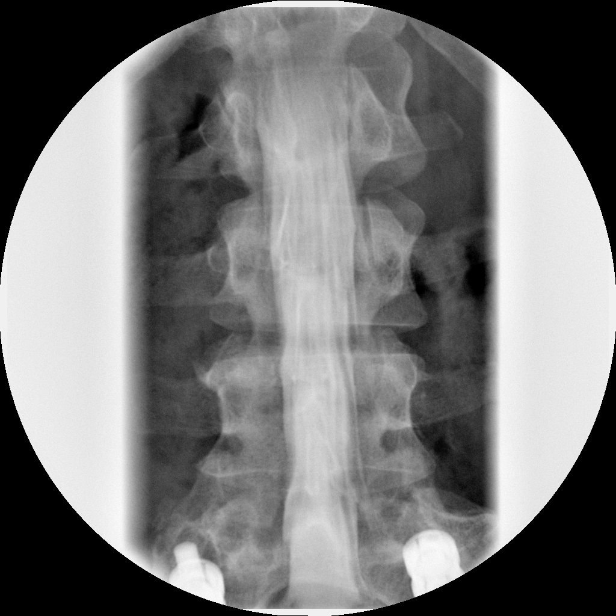

[Series 3: myelogram  white · 1 of 1 slices shown (2 of 9)]
[im 1/1]
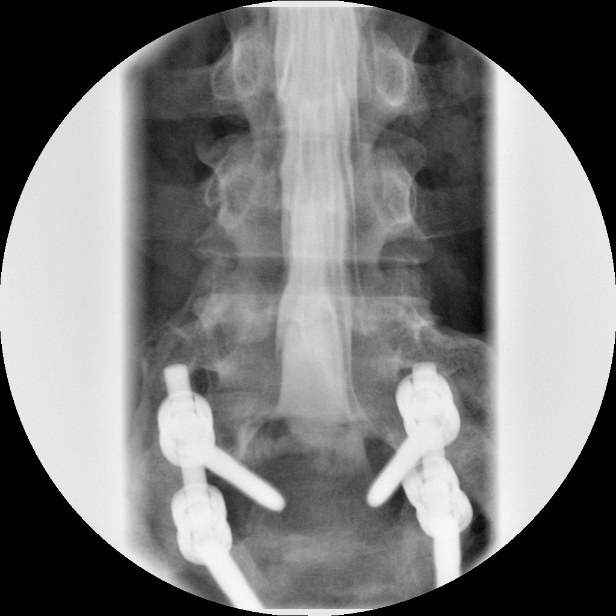

[Series 4: myelogram  white · 1 of 1 slices shown (3 of 9)]
[im 1/1]
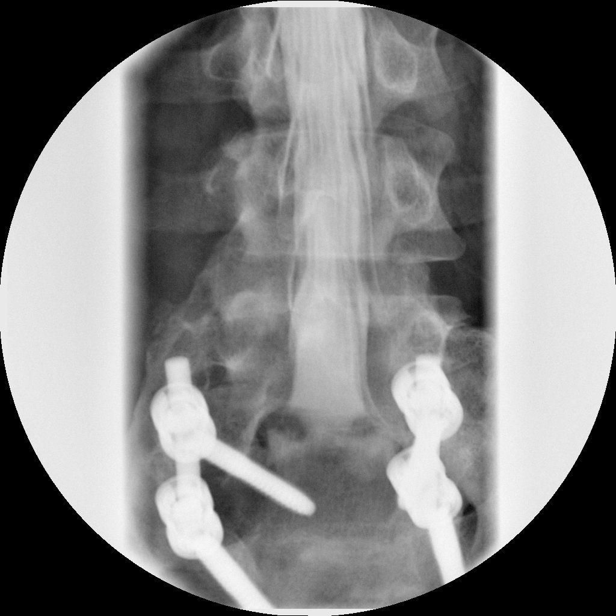

[Series 5: myelogram  white · 1 of 1 slices shown (4 of 9)]
[im 1/1]
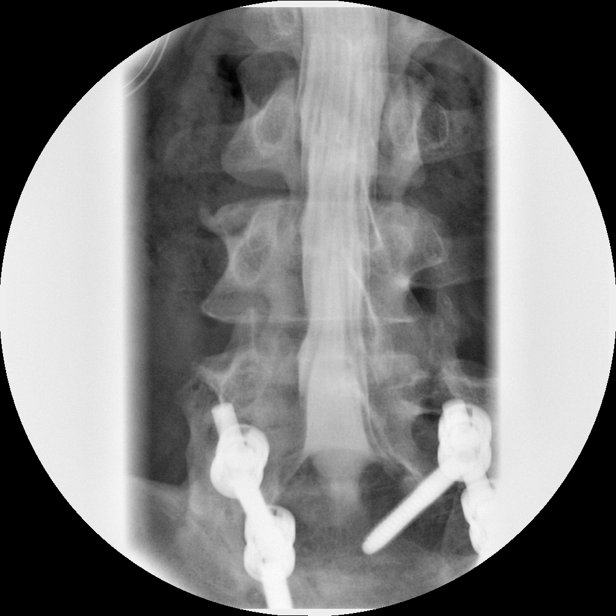

[Series 6: myelogram  white · 1 of 1 slices shown (5 of 9)]
[im 1/1]
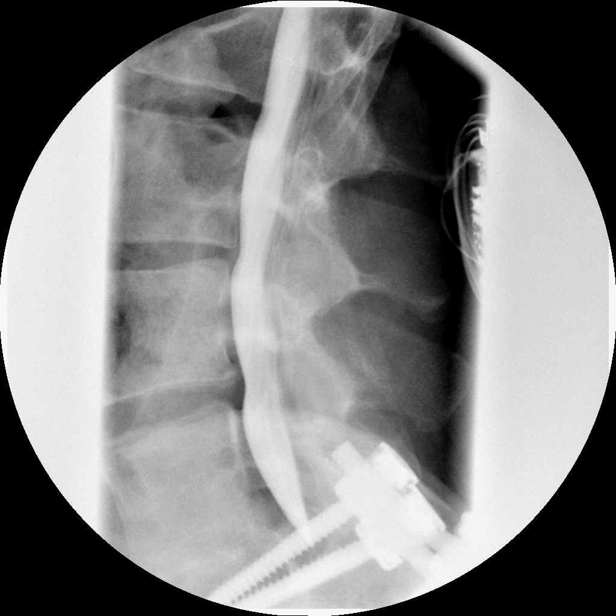

[Series 8: myelogram  white · 1 of 1 slices shown (6 of 9)]
[im 1/1]
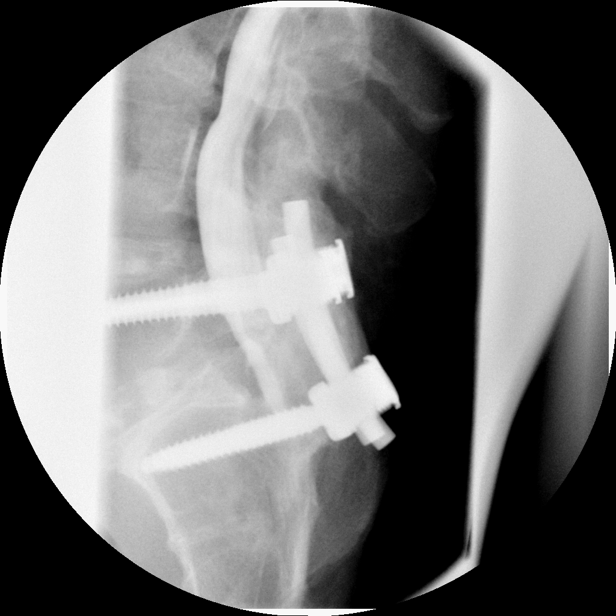

[Series 9: myelogram  white · 1 of 1 slices shown (7 of 9)]
[im 1/1]
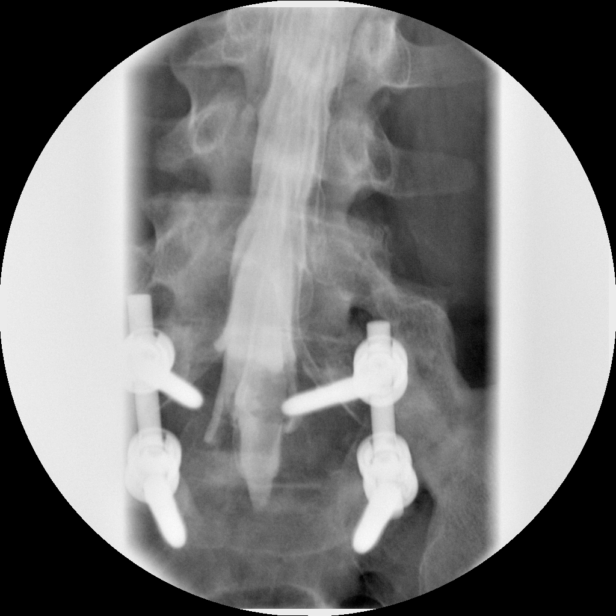

[Series 10: myelogram  white · 1 of 1 slices shown (8 of 9)]
[im 1/1]
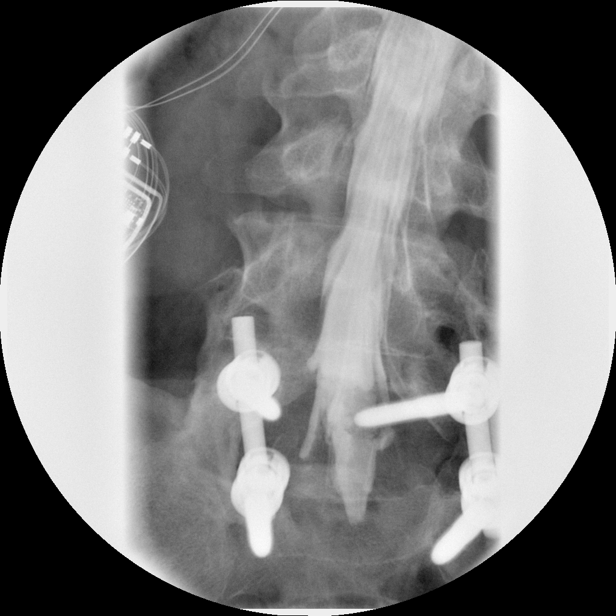

[Series 11: myelogram  white · 1 of 1 slices shown (9 of 9)]
[im 1/1]
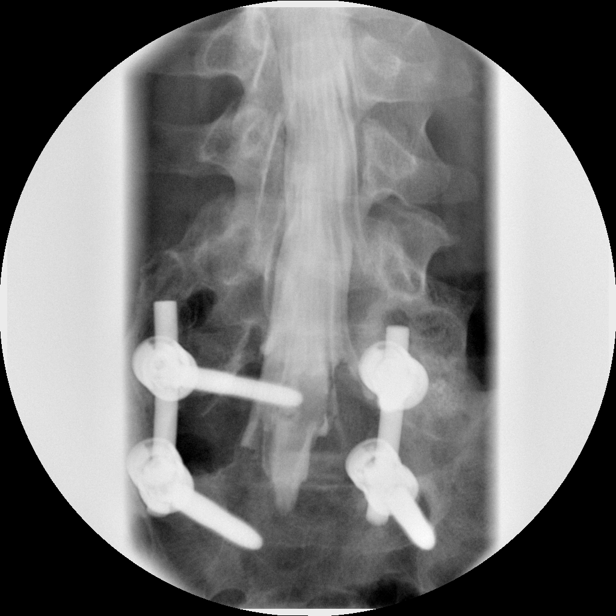

[Series 1001: view not recorded · 0.20mm/px · 1 of 1 slices shown (1 of 3)]
[im 1/1]
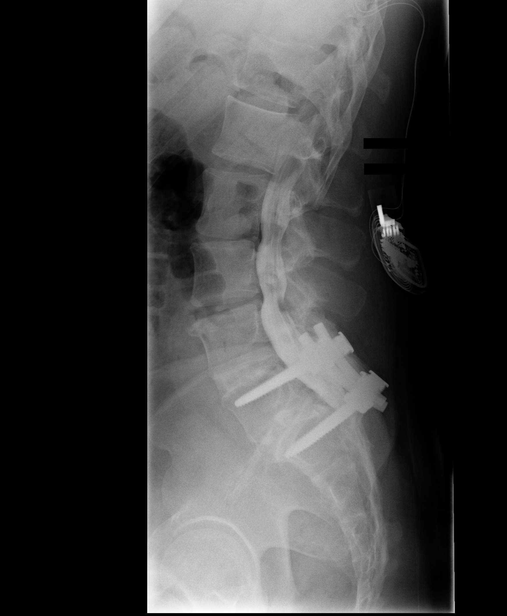

[Series 1002: view not recorded · 0.20mm/px · 1 of 1 slices shown (2 of 3)]
[im 1/1]
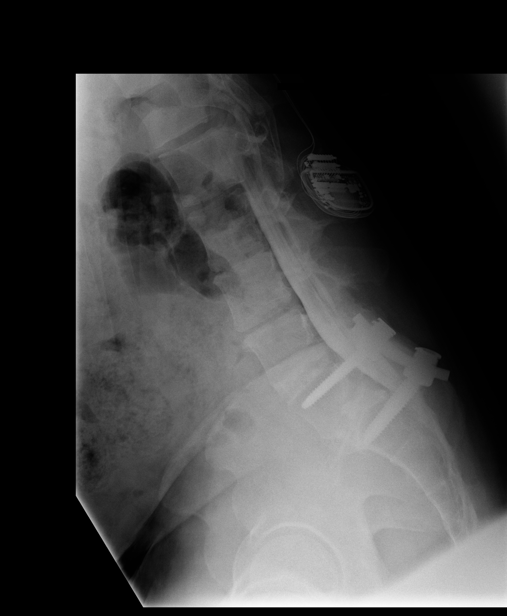

[Series 1003: view not recorded · 0.20mm/px · 1 of 1 slices shown (3 of 3)]
[im 1/1]
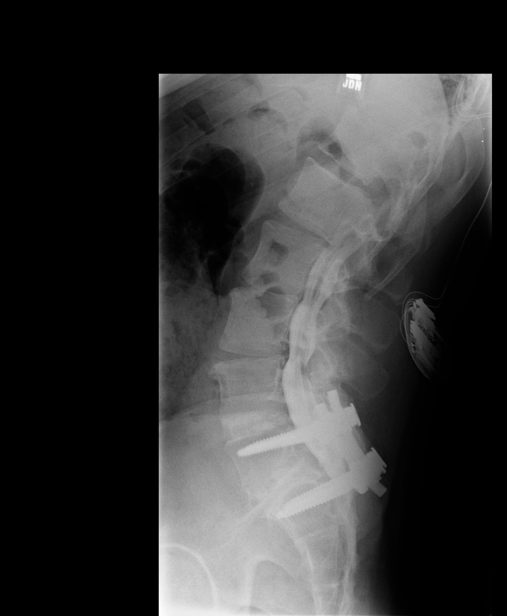

[13 of 14 positions shown; findings below may reference images not displayed]

FLUOROSCOPY TIME:  dictate in minutes and seconds

PROCEDURE:
After thorough discussion of risks and benefits of the procedure
including bleeding, infection, injury to nerves, blood vessels,
adjacent structures as well as headache and CSF leak, written and
oral informed consent was obtained. Consent was obtained by Dr. Kalu
Tai Fuk. Time out form was completed.

Patient was positioned prone on the fluoroscopy table. Local
anesthesia was provided with 1% lidocaine without epinephrine after
prepped and draped in the usual sterile fashion. Puncture was
performed at L2-3 using a 3 1/2 inch 22-gauge spinal needle via
left-sided approach. Using a single pass through the dura, the
needle was placed within the thecal sac, with return of clear CSF.
15 mL of Amnipaque-0BB was injected into the thecal sac, with normal
opacification of the nerve roots and cauda equina consistent with
free flow within the subarachnoid space.

I personally performed the lumbar puncture and administered the
intrathecal contrast. I also personally performed acquisition of the
myelogram images.
FINDINGS: LUMBAR MYELOGRAM FINDINGS:

Discectomy and fusion has been performed from L4 to the sacrum with
pedicle screws and posterior rods at the L5-S1 level.

There is mild narrowing of the lateral recesses at L3-4 without
gross neural compression.

There is an intradural filling defect to the right of midline behind
the L5 vertebral body. This has the appearance of a 8 mass lesion
associated with the nerve roots.

Standing lateral flexion-extension views do not show any motion in
the fusion segment. There is slight rocking motion at the L3-4 level
without significant listhesis.

CT LUMBAR MYELOGRAM FINDINGS:

T12-L1:  Normal interspace.  Conus tip at upper L1.

L1-2:  Minimal bulging of the disc.  No stenosis.

L2-3:  Mild bulging of the disc.  No significant stenosis.

L3-4: Mild bulging of the disc. Mild facet and ligamentous
hypertrophy. Minimal narrowing of the lateral recesses. Foraminal
narrowing left more than right because of endplate osteophytes and
bulging disc material. There would be some potential for compression
of the left L3 nerve root.

L4 to sacrum: Solid fusion. Wide patency of the canal and foramina.
Approximately 10-12 mm mass density with the nerve roots on the
right behind the L5 level. This was not present in 6366. This is
nonspecific and could be a benign or malignant mass.

No significant sacroiliac degeneration seen.

Well-circumscribed lucency anteriorly within the L3 vertebral body
measuring maximally 8 mm is unchanged since 6366 and therefore
certainly benign. Screw shadows are present at L4.
IMPRESSION: LUMBAR MYELOGRAM and post myelogram CT IMPRESSION:

Solid fusion from L4 to the sacrum with wide patency of the canal
and foramina.

10 x 12 mm mass density associated with the nerve roots to the right
of midline behind L5. Most commonly, this would represent a
neurofibroma. However, malignant lesions such as metastatic disease
is not excluded. One might consider MRI with and without contrast to
evaluate this further.

Mild adjacent segment degenerative disease at L3-4 with bulging of
the disc and facet and ligamentous hypertrophy. Foraminal stenosis
on the left because of endplate osteophytes and bulging disc
material could focally affect the L3 nerve root.

## 2017-05-06 NOTE — Nursing Note (Signed)
Adult Patient History Form-Text       Adult Patient History Entered On:  05/06/2017 17:36 EDT    Performed On:  05/06/2017 17:34 EDT by Anice Paganini, RN, KATHERINE A               General Info   Patient Identified :   Identification band   Preferred Mode of Communication :   Verbal, Written   Pregnancy Status :   N/A   Anice Paganini, RN, KATHERINE A - 05/06/2017 17:34 EDT   Allergies   (As Of: 05/06/2017 17:36:00 EDT)   Allergies (Active)   No Known Medication Allergies  Estimated Onset Date:   Unspecified ; Created By:   August Saucer, RN, DONNA L-RN; Reaction Status:   Active ; Category:   Drug ; Substance:   No Known Medication Allergies ; Type:   Allergy ; Updated By:   August Saucer RN, Serita Butcher; Reviewed Date:   05/06/2017 12:14 EDT        Medication History   Medication List   (As Of: 05/06/2017 17:36:00 EDT)   Normal Order    Sodium Chloride 0.45% intravenous solution 1,000 mL  :   Sodium Chloride 0.45% intravenous solution 1,000 mL ; Status:   Ordered ; Ordered As Mnemonic:   Sodium Chloride 0.45% 1,000 mL ; Simple Display Line:   100 mL/hr, IV ; Ordering Provider:   LANDERS-PA,  SUMMER A; Catalog Code:   Sodium Chloride 0.45% ; Order Dt/Tm:   05/06/2017 16:18:30          Lactated Ringers Injection solution 1000 mL  :   Lactated Ringers Injection solution 1000 mL ; Status:   Ordered ; Ordered As Mnemonic:   Lactated Ringers Injection 1000 mL ; Simple Display Line:   40 mL/hr, IV ; Ordering Provider:   Bonna Gains; Catalog Code:   Lactated Ringers Injection ; Order Dt/Tm:   04/10/2017 15:49:45 ; Comment:   Perioperative use ONLY  For Non Dialysis Patient          Sodium Chloride 0.9% intravenous solution 1000 mL  :   Sodium Chloride 0.9% intravenous solution 1000 mL ; Status:   Discontinued ; Ordered As Mnemonic:   Sodium Chloride 0.9% 1000 mL ; Simple Display Line:   40 mL/hr, IV ; Ordering Provider:   Bonna Gains; Catalog Code:   Sodium Chloride 0.9% ; Order Dt/Tm:   04/10/2017 15:49:46 ; Comment:   Perioperative use  ONLY          amLODIPine 5 mg Tab  :   amLODIPine 5 mg Tab ; Status:   Ordered ; Ordered As Mnemonic:   amLODIPine ; Simple Display Line:   5 mg, 1 tabs, Oral, Daily ; Ordering Provider:   Sheran Lawless A; Catalog Code:   amLODIPine ; Order Dt/Tm:   05/06/2017 14:23:09          atorvastatin 40 mg Tab  :   atorvastatin 40 mg Tab ; Status:   Ordered ; Ordered As Mnemonic:   atorvastatin ; Simple Display Line:   40 mg, 1 tabs, Oral, Daily ; Ordering Provider:   Sheran Lawless A; Catalog Code:   atorvastatin ; Order Dt/Tm:   05/06/2017 14:23:24          DULoxetine 30 mg DR Cap  :   DULoxetine 30 mg DR Cap ; Status:   Ordered ; Ordered As Mnemonic:   DULoxetine ; Simple Display Line:   60 mg, 2 caps,  Oral, Daily ; Ordering Provider:   Rush Barer,  SUMMER A; Catalog Code:   DULoxetine ; Order Dt/Tm:   05/06/2017 14:23:13          losartan 50 mg Tab  :   losartan 50 mg Tab ; Status:   Ordered ; Ordered As Mnemonic:   losartan ; Simple Display Line:   50 mg, 1 tabs, Oral, Daily ; Ordering Provider:   Rush Barer,  SUMMER A; Catalog Code:   losartan ; Order Dt/Tm:   05/06/2017 14:23:19          metoprolol succinate 50 mg ER Tab  :   metoprolol succinate 50 mg ER Tab ; Status:   Ordered ; Ordered As Mnemonic:   Metoprolol Succinate ER ; Simple Display Line:   50 mg, 1 tabs, Oral, Daily ; Ordering Provider:   Sheran Lawless A; Catalog Code:   metoprolol ; Order Dt/Tm:   05/06/2017 14:23:21          Non-Formulary Medication  :   Non-Formulary Medication ; Status:   Ordered ; Ordered As Mnemonic:   VESIcare 10 mg oral tablet ; Simple Display Line:   10 mg, Oral, Daily ; Ordering Provider:   Sheran Lawless A; Catalog Code:   Non-Formulary Medication ; Order Dt/Tm:   05/06/2017 16:27:36          solifenacin  :   solifenacin ; Status:   Voided ; Ordered As Mnemonic:   VESIcare 10 mg oral tablet ; Simple Display Line:   10 mg, 1 tabs, Oral, Daily ; Ordering Provider:   Sheran Lawless A; Catalog Code:   solifenacin  ; Order Dt/Tm:   05/06/2017 14:23:28          tamsulosin 0.4 mg Cap  :   tamsulosin 0.4 mg Cap ; Status:   Ordered ; Ordered As Mnemonic:   tamsulosin ; Simple Display Line:   0.4 mg, 1 caps, Oral, Daily ; Ordering Provider:   Rush Barer,  SUMMER A; Catalog Code:   tamsulosin ; Order Dt/Tm:   05/06/2017 14:23:32          ceFAZolin duplex  :   ceFAZolin duplex ; Status:   Ordered ; Ordered As Mnemonic:   Ancef IVPB ; Simple Display Line:   2 g, 50 mL, 100 mL/hr, IV Piggyback, q8hr ; Ordering Provider:   Rush Barer,  SUMMER A; Catalog Code:   ceFAZolin ; Order Dt/Tm:   05/06/2017 16:18:34          glycopyrrolate 1 mg Tab  :   glycopyrrolate 1 mg Tab ; Status:   Ordered ; Ordered As Mnemonic:   glycopyrrolate ; Simple Display Line:   1 mg, 1 tabs, Oral, TID ; Ordering Provider:   Sheran Lawless A; Catalog Code:   glycopyrrolate ; Order Dt/Tm:   05/06/2017 14:23:15          docusate-senna 50 mg-8.6 mg Tab  :   docusate-senna 50 mg-8.6 mg Tab ; Status:   Ordered ; Ordered As Mnemonic:   docusate-senna 50 mg-8.6 mg oral tablet ; Simple Display Line:   1 tabs, Oral, BID ; Ordering Provider:   Rush Barer,  SUMMER A; Catalog Code:   docusate-senna ; Order Dt/Tm:   05/06/2017 16:18:32 ; Comment:   hold for loose stools          acetaminophen 325 mg Tab  :   acetaminophen 325 mg Tab ; Status:   Ordered ; Ordered As Mnemonic:  acetaminophen ; Simple Display Line:   650 mg, 2 tabs, Oral, q6hr, PRN: mild pain (1-3) ; Ordering Provider:   Sheran Lawless A; Catalog Code:   acetaminophen ; Order Dt/Tm:   05/06/2017 16:18:31          acetaminophen-oxyCODONE 325 mg-5 mg Tab  :   acetaminophen-oxyCODONE 325 mg-5 mg Tab ; Status:   Ordered ; Ordered As Mnemonic:   oxyCODONE-acetaminophen 5 mg-325 mg oral tablet range dose ; Simple Display Line:   2 tabs, Oral, q4hr, PRN: moderate pain (4-7) ; Ordering Provider:   Sheran Lawless A; Catalog Code:   acetaminophen-oxyCODONE ; Order Dt/Tm:   05/06/2017 16:18:32 ; Comment:   MAX  DAILY DOSE OF ACETAMINOPHEN = 3000 MG  MAX DAILY DOSE OF ACETAMINOPHEN = 4000 MG          aluminum hydroxide/magnesium hydroxide/simethicone 200 mg-200 mg-20 mg/5 mL  :   aluminum hydroxide/magnesium hydroxide/simethicone 200 mg-200 mg-20 mg/5 mL ; Status:   Ordered ; Ordered As Mnemonic:   aluminum hydroxide/magnesium hydroxide/simethicone 200 mg-200 mg-20 mg/5 mL oral suspension ; Simple Display Line:   30 mL, Oral, q4hr, PRN: indigestion ; Ordering Provider:   LANDERS-PA,  SUMMER A; Catalog Code:   Al hydroxide/Mg hydroxide/simethicone ; Order Dt/Tm:   05/06/2017 16:18:33          bisacodyl 10 mg Rectal Supp  :   bisacodyl 10 mg Rectal Supp ; Status:   Ordered ; Ordered As Mnemonic:   bisacodyl ; Simple Display Line:   10 mg, 1 supp, PR, Daily, PRN: constipation ; Ordering Provider:   LANDERS-PA,  SUMMER A; Catalog Code:   bisacodyl ; Order Dt/Tm:   05/06/2017 16:18:33 ; Comment:   unrelieved by docusate-senna          cyclobenzaprine 10 mg Tab  :   cyclobenzaprine 10 mg Tab ; Status:   Ordered ; Ordered As Mnemonic:   cyclobenzaprine ; Simple Display Line:   10 mg, 1 tabs, Oral, TID, PRN: other (see comment) ; Ordering Provider:   Sheran Lawless A; Catalog Code:   cyclobenzaprine ; Order Dt/Tm:   05/06/2017 16:18:33          diphenhydrAMINE 25 mg Cap  :   diphenhydrAMINE 25 mg Cap ; Status:   Ordered ; Ordered As Mnemonic:   diphenhydrAMINE ; Simple Display Line:   25 mg, 1 caps, Oral, q6hr, PRN: itching/allergic reaction ; Ordering Provider:   Rush Barer,  SUMMER A; Catalog Code:   diphenhydrAMINE ; Order Dt/Tm:   05/06/2017 16:18:33          lidocaine 2% Topical Gel with applicator 10 mL  :   lidocaine 2% Topical Gel with applicator 10 mL ; Status:   Completed ; Ordered As Mnemonic:   Uro-Jet 2% topical gel with applicator ; Simple Display Line:   1 app, Transurethral, Once ; Ordering Provider:   LANDERS-PA,  SUMMER A; Catalog Code:   lidocaine topical ; Order Dt/Tm:   05/06/2017 16:18:29          morphine 4  mg/mL preservative-free Sol  :   morphine 4 mg/mL preservative-free Sol ; Status:   Ordered ; Ordered As Mnemonic:   morphine range dose ; Simple Display Line:   8 mg, 2 mL, IM, q4hr, PRN: severe pain (8-10) ; Ordering Provider:   Sheran Lawless A; Catalog Code:   morphine ; Order Dt/Tm:   05/06/2017 16:18:32  ondansetron 2 mg/mL Inj Soln 2 mL  :   ondansetron 2 mg/mL Inj Soln 2 mL ; Status:   Ordered ; Ordered As Mnemonic:   ondansetron ; Simple Display Line:   4 mg, 2 mL, IV Push, q6hr, PRN: nausea/vomiting ; Ordering Provider:   Rush Barer,  SUMMER A; Catalog Code:   ondansetron ; Order Dt/Tm:   05/06/2017 16:18:31 ; Comment:   use ondansetron before promethazine if both are ordered          phenol 1.4% Topical Spray 177 mL  :   phenol 1.4% Topical Spray 177 mL ; Status:   Ordered ; Ordered As Mnemonic:   phenol 1.4% topical spray ; Simple Display Line:   5 sprays, Oral, q2hr, PRN: sore throat ; Ordering Provider:   Rush Barer,  SUMMER A; Catalog Code:   phenol topical ; Order Dt/Tm:   05/06/2017 16:18:32          simethicone 125 mg Chew Tab  :   simethicone 125 mg Chew Tab ; Status:   Ordered ; Ordered As Mnemonic:   simethicone ; Simple Display Line:   125 mg, 1 tabs, Oral, QID, PRN: gas ; Ordering Provider:   LANDERS-PA,  SUMMER A; Catalog Code:   simethicone ; Order Dt/Tm:   05/06/2017 16:18:33 ; Comment:   max dose 500 mg in 24 hours          zolpidem 5 mg Tab  :   zolpidem 5 mg Tab ; Status:   Ordered ; Ordered As Mnemonic:   zolpidem ; Simple Display Line:   10 mg, 2 tabs, Oral, Once a Day (at bedtime), PRN: insomnia ; Ordering Provider:   Rush Barer,  SUMMER A; Catalog Code:   zolpidem ; Order Dt/Tm:   05/06/2017 16:18:34          HYDROmorphone 1 mg/mL Inj Soln 2 mL  :   HYDROmorphone 1 mg/mL Inj Soln 2 mL ; Status:   Discontinued ; Ordered As Mnemonic:   HYDROmorphone range dose ; Simple Display Line:   0.5 mg, 0.5 mL, IV Push, q32min, PRN: other (see comment) ; Ordering Provider:   Tilda Burrow; Catalog Code:   HYDROmorphone ; Order Dt/Tm:   05/06/2017 14:21:35 ; Comment:   For Pain  Max dose = 2mg  Primary choice          labetalol 5 mg/mL IV Soln 4 mL  :   labetalol 5 mg/mL IV Soln 4 mL ; Status:   Discontinued ; Ordered As Mnemonic:   labetalol ; Simple Display Line:   5 mg, 1 mL, IV Push, q62min, PRN: other (see comment) ; Ordering Provider:   Tilda Burrow; Catalog Code:   labetalol ; Order Dt/Tm:   05/06/2017 14:21:36 ; Comment:   Give for SBP greater than 160  Hold for HR less than  60.    Max dose 20 mg.          morphine 1 mg/mL PF Inj Sol 2 mL  :   morphine 1 mg/mL PF Inj Sol 2 mL ; Status:   Discontinued ; Ordered As Mnemonic:   morphine range dose ; Simple Display Line:   5 mg, 5 mL, IV Push, q23min, PRN: other (see comment) ; Ordering Provider:   Tilda Burrow; Catalog Code:   morphine ; Order Dt/Tm:   05/06/2017 14:21:36 ; Comment:   For Pain  Max dose = 10 mg For pain unrelieved by primary choice  ondansetron 2 mg/mL Inj Soln 2 mL  :   ondansetron 2 mg/mL Inj Soln 2 mL ; Status:   Discontinued ; Ordered As Mnemonic:   ondansetron ; Simple Display Line:   4 mg, 2 mL, IV Push, Once, PRN: nausea ; Ordering Provider:   Tilda Burrow; Catalog Code:   ondansetron ; Order Dt/Tm:   05/06/2017 14:21:36 ; Comment:   Primary choice          A Patient Specific Medication  :   A Patient Specific Medication ; Status:   Ordered ; Ordered As Mnemonic:   A Patient Specific Medication ; Simple Display Line:   1 EA, Kit-Combo, q85min, PRN: other (see comment) ; Ordering Provider:   Bonna Gains; Catalog Code:   A Patient Specific Medication ; Order Dt/Tm:   05/06/2017 11:25:59 ; Comment:   to access the patient specific medication drawer          A Patient Specific Refrigerated Medication  :   A Patient Specific Refrigerated Medication ; Status:   Ordered ; Ordered As Mnemonic:   A Patient Specific Refrigerated Medication ; Simple Display Line:   1 EA,  Kit-Combo, q73min, PRN: other (see comment) ; Ordering Provider:   Bonna Gains; Catalog Code:   A Patient Specific Refrigerated Medicati ; Order Dt/Tm:   05/06/2017 11:26:00 ; Comment:   to access the patient specific Refrigerated medications          ceFAZolin 2 g/100 ml NS  :   ceFAZolin 2 g/100 ml NS ; Status:   Completed ; Ordered As Mnemonic:   ceFAZolin IVPB ; Simple Display Line:   2 g, 100 mL, 200 mL/hr, IV Piggyback, On Call ; Ordering Provider:   Bonna Gains; Catalog Code:   ceFAZolin ; Order Dt/Tm:   04/10/2017 15:49:46          lidocaine 1% PF Inj Soln 2 mL  :   lidocaine 1% PF Inj Soln 2 mL ; Status:   Ordered ; Ordered As Mnemonic:   lidocaine 1% preservative-free injectable solution ; Simple Display Line:   0.5 mL, ID, q85min, PRN: other (see comment) ; Ordering Provider:   Bonna Gains; Catalog Code:   lidocaine ; Order Dt/Tm:   05/06/2017 11:26:00 ; Comment:   to access lidocaine 1%  2 mL vial for IV start and Life Port access          Respiratory MDI Treatment  :   Respiratory MDI Treatment ; Status:   Ordered ; Ordered As Mnemonic:   Respiratory MDI Treatment ; Simple Display Line:   1 EA, Kit-Combo, q76min, PRN: other (see comment) ; Ordering Provider:   Bonna Gains; Catalog Code:   Respiratory MDI Treatment ; Order Dt/Tm:   05/06/2017 11:26:00          sodium chloride 0.9% Inj Soln 10 mL syringe  :   sodium chloride 0.9% Inj Soln 10 mL syringe ; Status:   Ordered ; Ordered As Mnemonic:   sodium chloride 0.9% flush syringe range dose ; Simple Display Line:   30 mL, IV Push, q40min, PRN: other (see comment) ; Ordering Provider:   Bonna Gains; Catalog Code:   sodium chloride flush ; Order Dt/Tm:   05/06/2017 11:26:00 ; Comment:   for access to sodium chloride 0.9% syringe for INT flush if needed          sodium chloride 0.9%  Inj Soln 10 mL vial  :   sodium chloride 0.9% Inj Soln 10 mL vial ; Status:   Ordered ; Ordered As Mnemonic:   sodium chloride 0.9% vial for  reconstitution range dose ; Simple Display Line:   30 mL, IV Push, q60min, PRN: other (see comment) ; Ordering Provider:   Bonna Gains; Catalog Code:   sodium chloride flush ; Order Dt/Tm:   05/06/2017 11:26:00 ; Comment:   for access to sodium chloride 0.9% vial when needed as a diluent for reconstitutable medications          sterile water Inj Soln 10 mL  :   sterile water Inj Soln 10 mL ; Status:   Ordered ; Ordered As Mnemonic:   sterile water for reconstitution ; Simple Display Line:   10 mL, N/A, q38min, PRN: other (see comment) ; Ordering Provider:   Bonna Gains; Catalog Code:   sterile water for reconstitution ; Order Dt/Tm:   05/06/2017 11:26:00 ; Comment:   to access sterile water when needed as a diluent for reconstitutable medications. Not for IV use.            Home Meds    rosuvastatin  :   rosuvastatin ; Status:   Documented ; Ordered As Mnemonic:   Crestor 20 mg oral tablet ; Simple Display Line:   20 mg, 1 tabs, Oral, Daily, 0 Refill(s) ; Catalog Code:   rosuvastatin ; Order Dt/Tm:   05/02/2017 09:50:46          solifenacin  :   solifenacin ; Status:   Documented ; Ordered As Mnemonic:   VESIcare 10 mg oral tablet ; Simple Display Line:   10 mg, 1 tabs, Oral, Daily, 30 tabs, 0 Refill(s) ; Catalog Code:   solifenacin ; Order Dt/Tm:   05/02/2017 09:50:53          tamsulosin  :   tamsulosin ; Status:   Documented ; Ordered As Mnemonic:   tamsulosin 0.4 mg oral capsule ; Simple Display Line:   0.4 mg, 1 caps, Oral, Daily, 30 caps, 0 Refill(s) ; Catalog Code:   tamsulosin ; Order Dt/Tm:   05/02/2017 09:51:01          metoprolol  :   metoprolol ; Status:   Documented ; Ordered As Mnemonic:   Metoprolol Succinate ER 50 mg oral tablet, extended release ; Simple Display Line:   50 mg, 1 tabs, Oral, Daily, 0 Refill(s) ; Catalog Code:   metoprolol ; Order Dt/Tm:   04/02/2017 11:03:26          amLODIPine  :   amLODIPine ; Status:   Documented ; Ordered As Mnemonic:   amLODIPine 5 mg oral tablet ;  Simple Display Line:   5 mg, 1 tabs, Oral, Daily, 0 Refill(s) ; Catalog Code:   amLODIPine ; Order Dt/Tm:   04/02/2017 11:03:26          glycopyrrolate  :   glycopyrrolate ; Status:   Documented ; Ordered As Mnemonic:   glycopyrrolate 1 mg oral tablet ; Simple Display Line:   1 mg, 1 tabs, Oral, TID, 0 Refill(s) ; Catalog Code:   glycopyrrolate ; Order Dt/Tm:   04/02/2017 11:03:26          HYDROcodone-acetaminophen  :   HYDROcodone-acetaminophen ; Status:   Documented ; Ordered As Mnemonic:   HYDROcodone-acetaminophen 10 mg-325 mg range dose ; Simple Display Line:   1 tabs, Oral, q6hr, PRN, 0  Refill(s) ; Catalog Code:   HYDROcodone-acetaminophen ; Order Dt/Tm:   04/02/2017 11:03:26          losartan  :   losartan ; Status:   Documented ; Ordered As Mnemonic:   losartan 50 mg oral tablet ; Simple Display Line:   50 mg, 1 tabs, Oral, Daily, 0 Refill(s) ; Catalog Code:   losartan ; Order Dt/Tm:   04/02/2017 11:03:26          sitaGLIPtin-metFORMIN  :   sitaGLIPtin-metFORMIN ; Status:   Documented ; Ordered As Mnemonic:   Janumet 50 mg-1000 mg oral tablet ; Simple Display Line:   1 tabs, Oral, BID, 0 Refill(s) ; Catalog Code:   sitaGLIPtin-metFORMIN ; Order Dt/Tm:   04/02/2017 11:03:26          DULoxetine  :   DULoxetine ; Status:   Documented ; Ordered As Mnemonic:   DULoxetine 60 mg oral delayed release capsule ; Simple Display Line:   60 mg, 1 caps, Oral, Daily, 0 Refill(s) ; Catalog Code:   DULoxetine ; Order Dt/Tm:   04/02/2017 11:03:26            Problem History   (As Of: 05/06/2017 17:36:00 EDT)   Problems(Active)    Claustrophobia (SNOMED CT  :04540981 )  Name of Problem:   Claustrophobia ; Recorder:   DEAN, RN, DONNA L-RN; Confirmation:   Confirmed ; Classification:   Patient Stated ; Code:   [id number] ; Contributor System:   PowerChart ; Last Updated:   04/02/2017 11:09 EDT ; Life Cycle Date:   04/02/2017 ; Life Cycle Status:   Active ; Vocabulary:   SNOMED CT        Diabetes (SNOMED CT  :562130865 )  Name of Problem:    Diabetes ; Recorder:   DEAN, RN, DONNA L-RN; Confirmation:   Confirmed ; Classification:   Patient Stated ; Code:   [id number] ; Contributor System:   PowerChart ; Last Updated:   04/02/2017 11:09 EDT ; Life Cycle Date:   04/02/2017 ; Life Cycle Status:   Active ; Vocabulary:   SNOMED CT        History of BPH (SNOMED CT  :284132440 )  Name of Problem:   History of BPH ; Recorder:   DEAN, RN, DONNA L-RN; Confirmation:   Confirmed ; Classification:   Patient Stated ; Code:   [id number] ; Contributor System:   Dietitian ; Last Updated:   04/02/2017 11:09 EDT ; Life Cycle Date:   04/02/2017 ; Life Cycle Status:   Active ; Vocabulary:   SNOMED CT        HTN (hypertension) (SNOMED CT  :4403474259 )  Name of Problem:   HTN (hypertension) ; Recorder:   DEAN, RN, DONNA L-RN; Confirmation:   Confirmed ; Classification:   Patient Stated ; Code:   [id number] ; Contributor System:   Dietitian ; Last Updated:   04/02/2017 11:08 EDT ; Life Cycle Date:   04/02/2017 ; Life Cycle Status:   Active ; Vocabulary:   SNOMED CT        Lumbar pain (SNOMED CT  :295188416 )  Name of Problem:   Lumbar pain ; Recorder:   DEAN, RN, DONNA L-RN; Confirmation:   Confirmed ; Classification:   Patient Stated ; Code:   [id number] ; Contributor System:   PowerChart ; Last Updated:   04/02/2017 11:09 EDT ; Life Cycle Date:   04/02/2017 ; Life Cycle Status:   Active ; Vocabulary:  SNOMED CT        Neck pain (SNOMED CT  :960454098 )  Name of Problem:   Neck pain ; Recorder:   DEAN, RN, DONNA L-RN; Confirmation:   Confirmed ; Classification:   Patient Stated ; Code:   [id number] ; Contributor System:   PowerChart ; Last Updated:   04/02/2017 11:09 EDT ; Life Cycle Date:   04/02/2017 ; Life Cycle Status:   Active ; Vocabulary:   SNOMED CT          Diagnoses(Active)    Cervical spinal stenosis  Date:   04/10/2017 ; Diagnosis Type:   Discharge ; Confirmation:   Confirmed ; Clinical Dx:   Cervical spinal stenosis ; Classification:   Medical ; Clinical Service:    Non-Specified ; Code:   ICD-10-CM ; Probability:   0 ; Diagnosis Code:   M48.02      Cervical spinal stenosis  Date:   05/02/2017 ; Confirmation:   Confirmed ; Clinical Dx:   Cervical spinal stenosis ; Classification:   Medical ; Clinical Service:   Non-Specified ; Code:   ICD-10-CM ; Probability:   0 ; Diagnosis Code:   M48.02      Type 2 diabetes mellitus without complications  Date:   05/02/2017 ; Confirmation:   Confirmed ; Clinical Dx:   Type 2 diabetes mellitus without complications ; Classification:   Medical ; Clinical Service:   Non-Specified ; Code:   ICD-10-CM ; Probability:   0 ; Diagnosis Code:   E11.9        Procedure History        -    Procedure History   (As Of: 05/06/2017 17:36:00 EDT)     Anesthesia Minutes:   0 ; Procedure Name:   Cervical fusion X3 levels ; Procedure Minutes:   0            Anesthesia Minutes:   0 ; Procedure Name:   lumbar laminectomy ; Procedure Minutes:   0            Procedure Dt/Tm:   02/11/2017 ; Anesthesia Minutes:   0 ; Procedure Name:   TURP ; Procedure Minutes:   0 ; Last Reviewed Dt/Tm:   02/11/2017 00:00:00 EDT            Anesthesia Minutes:   0 ; Procedure Name:   reviesion L5-S1 ; Procedure Minutes:   0            Anesthesia Minutes:   0 ; Procedure Name:   lumbar fusion L5-S1 ; Procedure Minutes:   0            Anesthesia Minutes:   0 ; Procedure Name:   spinal cord stimulator removal ; Procedure Minutes:   0            Procedure Dt/Tm:   05/06/2017 13:17:00 EDT ; Location:   [facility] ; Provider:   Bonna Gains; Anesthesia Type:   General ; :   Helyn Numbers,  JEFFREY DEAN; Anesthesia Minutes:   0 ; Procedure Name:   Anterior Cervical Discectomy with Fusion and/or Stabilization SCIP ; Procedure Minutes:   49 ; Comments:     05/06/2017 14:20 EDT - Ninetta Lights, RN, MELANIE W  auto-populated from documented surgical case ; Clinical Service:   Surgery ; Last Reviewed Dt/Tm:   05/06/2017 13:17:00 EDT            Anesthesia Minutes:   0 ; Procedure Name:  Colonoscopy ; Procedure  Minutes:   0            Anesthesia Minutes:   0 ; Procedure Name:   spinal cord stimulator ; Procedure Minutes:   0            Immunizations   Influenza Vaccine Status :   Non-influenza season (before Oct 1st and after Mar 31st)   Last Tetanus :   Maudry DiegoUnknown   KREIDER, RN, KATHERINE A - 05/06/2017 17:34 EDT   ID Risk Screen   Chills :   No   Cough (Any Duration) :   No   Fever :   No   Hemoptysis (Blood in Sputum) :   No   Night Sweats :   No   Weight Loss Greater Than 10 Pounds :   No   Hx of TB Now or at Any Time In the Past (Even if on Meds) :   No   Foreign-Born :   No   Homeless or In Shelter :   No   Incarcerated Within Last 2 Years :   No   Intravenous Drug User :   No   Male Homosexual :   No   New TST/IGRA Results Pos(Within 2 yrs),Hx Recent TB Exposure :   No   KREIDER, RN, KATHERINE A - 05/06/2017 17:34 EDT   3 or more loose/watery stools :   No   MRSA/VRE Screening :   None of these apply   Patient Recent Travel History :   No recent travel   Family Member Travel History :   No recent travel   [place], RN, [place] A - 05/06/2017 17:34 EDT   Infectious Disease Risk Factor Grid   Chills :   No   Fever :   No   Fatigue :   No   Headache :   No   Runny or Stuffy Nose :   No   Sore Throat :   No   Difficulty Breathing :   No   Shortness of Breath :   No   New or Worsening Cough :   No   Wheezing :   No   Vomiting :   No   Diarrhea :   No   Abdominal (Stomach Pain) :   No   Muscle Pain :   No   Weakness/Numbness :   No   Recent Exposure to Communicable Disease :   No   Illness With Generalized Rash :   No   Abnormal Bleeding :   No   Unexplained Hemorrhage (Bleeding or Bruising) :   No   Arthralgia :   No   Conjunctivitis :   No   Anice PaganiniKREIDER, RN, KATHERINE A - 05/06/2017 17:34 EDT   Bloodless Medicine   Will Patient Accept Blood Transfusion and/or Blood Products :   Yes   Anice PaganiniKREIDER, RN, KATHERINE A - 05/06/2017 17:34 EDT   Nutrition   Nutritional Risk Factors :   None   Anice PaganiniKREIDER, RN, KATHERINE A - 05/06/2017 17:34 EDT    Functional   Sensory Deficits :   None   Anice PaganiniKREIDER, RN, KATHERINE A - 05/06/2017 17:34 EDT   Social History   Social History   (As Of: 05/06/2017 17:36:00 EDT)   Tobacco:        Never smoker   (Last Updated: 04/02/2017 11:07:21 EDT by August SaucerEAN, RN, DONNA L-RN)          Alcohol:  Current, Wine, 1-2 times per week, 2 Number of Drinks Per Day.   (Last Updated: 04/02/2017 11:08:15 EDT by August Saucer, RN, DONNA L-RN)          Substance Abuse:        Past   (Last Updated: 04/02/2017 11:08:29 EDT by August Saucer, RN, DONNA L-RN)            Spiritual   Do you have a concern that you would like to address with a Chaplain? :   No   Do you have any religious/spiritual/cultural beliefs that could impact the way your care is provided? :   No   Anice Paganini, RN, KATHERINE A - 05/06/2017 17:34 EDT   Harm Screen   Injuries/Abuse/Neglect in Household :   Denies   Feels Unsafe at Home :   No   Recent Life Events :   None   Previous Mental Illness Diagnosis :   Anxiety disorder, Depressive disorder   Suicidal Behavior :   None   Self Harming Behavior :   None   Suicidal Ideation :   None   Anice Paganini, RN, KATHERINE A - 05/06/2017 17:34 EDT   Advance Directive   Advance Directive :   No   Anice Paganini RN, Felix Pacini - 05/06/2017 17:34 EDT   Education   Written Language :   [place]   Primary Language :   Laveda Abbe, RN, KATHERINE A - 05/06/2017 17:34 EDT   Caregiver/Advocate Language   Patient :   Programme researcher, broadcasting/film/video, Verbal explanation   Anice Paganini, RN, KATHERINE A - 05/06/2017 17:34 EDT   Barriers to Learning :   None evident   Teaching Method :   Explanation, Printed materials   Anice Paganini, RN, KATHERINE A - 05/06/2017 17:34 EDT   DCP GENERIC CODE   Unit/Room Orientation :   [place] understanding, Demonstrates   Environmental Safety :   Verbalizes understanding, Demonstrates   Infection Prevention :   Verbalizes understanding, Demonstrates   DVT Prophylaxis :   Verbalizes understanding, Priscille Loveless, RN, KATHERINE A - 05/06/2017 17:34 EDT   DC Needs    Living Situation :   Home with family support   Anice Paganini RN, Natalia Leatherwood A - 05/06/2017 17:34 EDT   Valuables and Belongings   Does Patient Have Valuables and Belongings :   Yes   Anice Paganini, RN, KATHERINE A - 05/06/2017 17:34 EDT   DCP GENERIC CODE   At Bedside :   Clothes, Purse/Wallet, Glasses, Cell phone   Anice Paganini RN, KATHERINE A - 05/06/2017 17:34 EDT   Admission Complete   Admission Complete :   Yes   Anice Paganini, RN, KATHERINE A - 05/06/2017 17:34 EDT

## 2017-05-06 NOTE — Nursing Note (Signed)
Medication Administration Follow Up-Text       Medication Administration Follow Up Entered On:  05/06/2017 15:07 EDT    Performed On:  05/06/2017 15:06 EDT by Myer HaffYARBROUGH, RN, DONNA C      Intervention Information:     hydromorphone  Performed by Myer HaffYARBROUGH, RN, DONNA C on 05/06/2017 15:06:00 EDT       hydromorphone,0.5mg   IV Push,Hand, Left,other (see comment)       Med Response   ED Medication Response :   Continue to observe for symptoms   Numeric Rating Pain Scale :   7   Pasero Opioid Induced Sedation Scale :   2 = Slightly drowsy, easily aroused   Pain Description Section :   Document assessment   Myer HaffYARBROUGH, RN, DONNA C - 05/06/2017 15:06 EDT   Pain Description   Time Pattern :   Constant   Pain Location :   Other: posterior neck.  dy   Quality :   Mignon PineAching   YARBROUGH, RN, DONNA C - 05/06/2017 15:06 EDT

## 2017-05-06 NOTE — Nursing Note (Signed)
Medication Administration Follow Up-Text       Medication Administration Follow Up Entered On:  05/06/2017 17:36 EDT    Performed On:  05/06/2017 17:34 EDT by Anice PaganiniKREIDER, RN, KATHERINE A      Intervention Information:     morphine  Performed by Anice PaganiniKREIDER, RN, KATHERINE A on 05/06/2017 16:45:00 EDT       morphine,4mg   IM,Deltoid, Left,severe pain (8-10)       Med Response   ED Medication Response :   Symptoms improved   Numeric Rating Pain Scale :   4   Pasero Opioid Induced Sedation Scale :   1 = Awake and alert   Respiratory Rate :   16 br/min   Anice PaganiniKREIDER, RN, KATHERINE A - 05/06/2017 17:34 EDT

## 2017-05-06 NOTE — Nursing Note (Signed)
Medication Administration Follow Up-Text       Medication Administration Follow Up Entered On:  05/06/2017 19:36 EDT    Performed On:  05/06/2017 19:36 EDT by Olivia Mackie, RN, Jodelle Red      Intervention Information:     acetaminophen-oxycodone  Performed by Anice Paganini RN, KATHERINE A on 05/06/2017 18:54:00 EDT       oxyCODONE-acetaminophen,2tabs  Oral,moderate pain (4-7)       Med Response   ED Medication Response :   Symptoms improved   Harriet Masson - 05/06/2017 19:36 EDT

## 2017-05-06 NOTE — Nursing Note (Signed)
Medication Administration Follow Up-Text       Medication Administration Follow Up Entered On:  05/06/2017 14:51 EDT    Performed On:  05/06/2017 14:46 EDT by Myer HaffYARBROUGH, RN, DONNA C      Intervention Information:     hydromorphone  Performed by Myer HaffYARBROUGH, RN, DONNA C on 05/06/2017 14:46:00 EDT       hydromorphone,0.5mg   IV Push,Hand, Left,other (see comment)       Med Response   ED Medication Response :   Continue to observe for symptoms   Numeric Rating Pain Scale :   7   Pasero Opioid Induced Sedation Scale :   2 = Slightly drowsy, easily aroused   Pain Description Section :   Document assessment   Myer HaffYARBROUGH, RN, DONNA C - 05/06/2017 14:50 EDT   Pain Description   Time Pattern :   Constant   Pain Location :   Other: posterior neck.  dy   Quality :   Mignon PineAching   YARBROUGH, RN, DONNA C - 05/06/2017 14:50 EDT

## 2017-05-06 NOTE — Nursing Note (Signed)
Medication Administration Follow Up-Text       Medication Administration Follow Up Entered On:  05/06/2017 14:57 EDT    Performed On:  05/06/2017 14:56 EDT by Myer HaffYARBROUGH, RN, DONNA C      Intervention Information:     hydromorphone  Performed by Myer HaffYARBROUGH, RN, DONNA C on 05/06/2017 14:56:00 EDT       hydromorphone,0.5mg   IV Push,Hand, Left,other (see comment)       Med Response   ED Medication Response :   Continue to observe for symptoms   Numeric Rating Pain Scale :   7   Pasero Opioid Induced Sedation Scale :   2 = Slightly drowsy, easily aroused   Pain Description Section :   Document assessment   Myer HaffYARBROUGH, RN, DONNA C - 05/06/2017 14:56 EDT   Pain Description   Time Pattern :   Constant   Pain Location :   Other: posterior neck.  dy   Quality :   Mignon PineAching   YARBROUGH, RN, DONNA C - 05/06/2017 14:56 EDT

## 2017-05-07 NOTE — Discharge Summary (Signed)
 Inpatient Patient Summary               [facility]  [address]  [place], [place] [address]  [phone]  Patient Discharge Instructions     Name: Kurt Mosley, Kurt Mosley  Current Date: 05/07/2017 11:02:08  DOB: 1960-11-21 MRN: [id number] FIN: [id number]  Patient Address: [address] [place] [address]  Patient Phone: [phone]  Primary Care Provider:  Name: COLONEL FAIRY LOVING  Phone: [phone]   Immunizations Provided:       Discharge Diagnosis: Cervical spinal stenosis  Discharged To: TO, ANTICIPATED%>Home with family support  Home Treatments: TREATMENTS, ANTICIPATED%>  Devices/Equipment: EQUIPMENT REHAB%>  Post Hospital Services: HOSPITAL SERVICES%>  Professional Skilled Services: SKILLED SERVICES%>  Therapist, sports and Community Resources:               SERV AND COMM RES, ANTICIPATED%>  Mode of Discharge Transportation: TRANSPORTATION%>Private vehicle  Discharge Orders           Discharge Patient 05/07/17 8:36:00 EDT, Discharge Home/Self Care         Comment:      Medications   During the course of your visit, your medication list was updated with the most current information. The details of those changes are reflected below:           New Medications  Other Medications  acetaminophen-oxyCODONE (oxyCODONE-acetaminophen 5 mg-325 mg oral tablet) 2 Tabs Oral (given by mouth) every 4 hours as needed moderate pain (4-7).  Patient Instructions: May take 1 tablet every 4 hours as needed for pain  Last Dose:____________________  cyclobenzaprine (cyclobenzaprine 10 mg oral tablet) 1 Tabs Oral (given by mouth) 3 times a day as needed other (see comment).  Last Dose:____________________  Medications that have not changed  Other Medications  amLODIPine (amLODIPine 5 mg oral tablet) 1 Tabs Oral (given by mouth) every day.  Last Dose:____________________  DULoxetine (DULoxetine 60 mg oral delayed release capsule) 1 Capsules Oral (given by mouth) every day.  Last  Dose:____________________  glycopyrrolate (glycopyrrolate 1 mg oral tablet) 1 Tabs Oral (given by mouth) 3 times a day.  Last Dose:____________________  losartan (losartan 50 mg oral tablet) 1 Tabs Oral (given by mouth) every day.  Last Dose:____________________  metoprolol (Metoprolol Succinate ER 50 mg oral tablet, extended release) 1 Tabs Oral (given by mouth) every day.  Last Dose:____________________  rosuvastatin (Crestor 20 mg oral tablet) 1 Tabs Oral (given by mouth) every day.  Last Dose:____________________  sitaGLIPtin-metFORMIN (Janumet 50 mg-1000 mg oral tablet) 1 Tabs Oral (given by mouth) 2 times a day.  Last Dose:____________________  solifenacin (VESIcare 10 mg oral tablet) 1 Tabs Oral (given by mouth) every day.  Last Dose:____________________  tamsulosin (tamsulosin 0.4 mg oral capsule) 1 Capsules Oral (given by mouth) every day.  Last Dose:____________________  No Longer Take the Following Medications  HYDROcodone-acetaminophen (HYDROcodone-acetaminophen 10 mg-325 mg range dose) 1 Tabs Oral (given by mouth) every 6 hours as needed.  Stop Taking Reason: Physician Request         [facility] would like to thank you for allowing us  to assist you with your healthcare needs. The following includes patient education materials and information regarding your injury/illness.     Verde, Eri has been given the following list of follow-up instructions, prescriptions, and patient education materials:  Follow-up Instructions:               With: Address: When:   ZACHARY CURL [address],  [address] [place] [address]  [phone] Business (1)    Comments:   as scheduled          With: Address: When:   [facility] [address] [place] [address]  [phone] Business (1)                     It is important to always keep an active list of medications available so that you can share with other providers and manage your medications appropriately. As  an additional courtesy, we are also providing you with your final active medications list that you can keep with you.             acetaminophen-oxyCODONE (oxyCODONE-acetaminophen 5 mg-325 mg oral tablet) 2 Tabs Oral (given by mouth) every 4 hours as needed moderate pain (4-7).  amLODIPine (amLODIPine 5 mg oral tablet) 1 Tabs Oral (given by mouth) every day.  cyclobenzaprine (cyclobenzaprine 10 mg oral tablet) 1 Tabs Oral (given by mouth) 3 times a day as needed other (see comment).  DULoxetine (DULoxetine 60 mg oral delayed release capsule) 1 Capsules Oral (given by mouth) every day.  glycopyrrolate (glycopyrrolate 1 mg oral tablet) 1 Tabs Oral (given by mouth) 3 times a day.  losartan (losartan 50 mg oral tablet) 1 Tabs Oral (given by mouth) every day.  metoprolol (Metoprolol Succinate ER 50 mg oral tablet, extended release) 1 Tabs Oral (given by mouth) every day.  rosuvastatin (Crestor 20 mg oral tablet) 1 Tabs Oral (given by mouth) every day.  sitaGLIPtin-metFORMIN (Janumet 50 mg-1000 mg oral tablet) 1 Tabs Oral (given by mouth) 2 times a day.  solifenacin (VESIcare 10 mg oral tablet) 1 Tabs Oral (given by mouth) every day.  tamsulosin (tamsulosin 0.4 mg oral capsule) 1 Capsules Oral (given by mouth) every day.      Take only the medications listed above. Contact your doctor prior to taking any medications not on this list.        Discharge instructions, if any, will display below     Instructions for Diet: INSTRUCTIONS FOR DIET%>   Instructions for Supplements: SUPPLEMENT INSTRUCTIONS%>   Instructions for Activity: INSTRUCTIONS FOR ACTIVITY%>   Instructions for Wound Care: INSTRUCTIONS FOR WOUND CARE%>     Medication leaflets, if any, will display below     Patient education materials, if any, will display below        Constipation (Adult)   Constipation means that you have bowel movements that are less frequent than usual. Stools often become very hard and difficult to pass.   Constipation is very common. At  some point in life it affects almost everyone. Since everyone's bowel habits are different, what is constipation to one person may not be to another. Your healthcare provider may do tests to diagnose constipation. It depends on what he or she finds when evaluating you.      Symptoms of constipation include:    Abdominal pain    Bloating    Vomiting    Painful bowel movements    Itching, swelling, bleeding, or pain around the anus   Causes   Constipation can have many causes. These include:    Diet low in fiber    Too much dairy    Not drinking enough liquids    Lack of exercise or physical activity. This is especially true for older adults.    Changes in lifestyle or daily routine, including pregnancy, aging, work, and travel  Frequent use or misuse of laxatives    Ignoring the urge to have a bowel movement or delaying it until later    Medicines, such as certain prescription pain medicines, iron supplements, antacids, certain antidepressants, and calcium supplements    Diseases like irritable bowel syndrome, bowel obstructions, stroke, diabetes, thyroid disease, Parkinson disease, hemorrhoids, and colon cancer   Complications   Potential complications of constipation can include:    Hemorrhoids    Rectal bleeding from hemorrhoids or anal fissures (skin tears)    Hernias    Dependency on laxatives    Chronic constipation    Fecal impaction    Bowel obstruction or perforation   Home care   All treatment should be done after talking with your healthcare provider. This is especially true if you have another medical problems, are taking prescription medicines, or are an older adult. Treatment most often involves lifestyle changes. You may also need medicines. Your healthcare provider will tell you which will work best for you. Follow the advice below to help avoid this problem in the future.   Lifestyle changes   These lifestyle changes can help prevent constipation:    Diet. Eat a high-fiber  diet, with fresh fruit and vegetables, and reduce dairy intake, meats, and processed foods    Fluids. It's important to get enough fluids each day. Drink plenty of water when you eat more fiber. If you are on diet that limits the amount of fluid you can have, talk about this with your healthcare provider.    Regular exercise. Check with your healthcare provider first.   Medications   Take any medicines as directed. Some laxatives are safe to use only every now and then. Others can be taken on a regular basis. Talk with your doctor or pharmacist if you have questions.   Prescription pain medicines can cause constipation. If you are taking this kind of medicine, ask your healthcare provider if you should also take a stool softener.   Medicines you may take to treat constipation include:    Fiber supplements    Stool softeners    Laxatives    Enemas    Rectal suppositories   Follow-up care   Follow up with your healthcare provider if symptoms don't get better in the next few days. You may need to have more tests or see a specialist.   Call 911   Call 911 if any of these occur:    Trouble breathing    Stiff, rigid abdomen that is severely painful to touch    Confusion    Fainting or loss of consciousness    Rapid heart rate    Chest pain   When to seek medical advice   Call your healthcare provider right away if any of these occur:    Fever over 100.60F (38C)    Failure to resume normal bowel movements    Pain in your abdomen or back gets worse    Nausea or vomiting    Swelling in your abdomen    Blood in the stool    Black, tarry stool    Involuntary weight loss    Weakness      2000-2017 [employer] [address], [place], [place] [address]. All rights reserved. This information is not intended as a substitute for professional medical care. Always follow your healthcare professional's instructions.             Acetaminophen; Oxycodone tablets   What is this  medicine?    ACETAMINOPHEN; OXYCODONE (a set a MEE noe fen; ox i KOE done) is a pain reliever. It is used to treat moderate to severe pain.   How should I use this medicine?   Take this medicine by mouth with a full glass of water. Follow the directions on the prescription label. You can take it with or without food. If it upsets your stomach, take it with food. Take your medicine at regular intervals. Do not take it more often than directed.   A special MedGuide will be given to you by the pharmacist with each prescription and refill. Be sure to read this information carefully each time.   Talk to your pediatrician regarding the use of this medicine in children. Special care may be needed.   What side effects may I notice from receiving this medicine?   Side effects that you should report to your doctor or health care professional as soon as possible:      allergic reactions like skin rash, itching or hives, swelling of the face, lips, or tongue      breathing problems      confusion      redness, blistering, peeling or loosening of the skin, including inside the mouth      signs and symptoms of liver injury like dark yellow or brown urine; general ill feeling or flu-like symptoms; light-colored stools; loss of appetite; nausea; right upper belly pain; unusually weak or tired; yellowing of the eyes or skin      signs and symptoms of low blood pressure like dizziness; feeling faint or lightheaded, falls; unusually weak or tired      trouble passing urine or change in the amount of urine     Side effects that usually do not require medical attention (report to your doctor or health care professional if they continue or are bothersome):      constipation      dry mouth      nausea, vomiting      tiredness    What may interact with this medicine?   This medicine may interact with the following medications:      alcohol      antihistamines for allergy, cough and cold      antiviral medicines for HIV or AIDS       atropine      certain antibiotics like clarithromycin, erythromycin, linezolid, rifampin      certain medicines for anxiety or sleep      certain medicines for bladder problems like oxybutynin, tolterodine      certain medicines for depression like amitriptyline, fluoxetine, sertraline      certain medicines for fungal infections like ketoconazole, itraconazole, voriconazole      certain medicines for migraine headache like almotriptan, eletriptan, frovatriptan, naratriptan, rizatriptan, sumatriptan, zolmitriptan      certain medicines for nausea or vomiting like dolasetron, ondansetron, palonosetron      certain medicines for Parkinson's disease like benztropine, trihexyphenidyl      certain medicines for seizures like phenobarbital, phenytoin, primidone      certain medicines for stomach problems like dicyclomine, hyoscyamine      certain medicines for travel sickness like scopolamine      diuretics      general anesthetics like halothane, isoflurane, methoxyflurane, propofol      ipratropium      local anesthetics like lidocaine, pramoxine, tetracaine      MAOIs like Carbex, Eldepryl, Marplan, Nardil, and Parnate  medicines that relax muscles for surgery      methylene blue      nilotinib      other medicines with acetaminophen      other narcotic medicines for pain or cough      phenothiazines like chlorpromazine, mesoridazine, prochlorperazine, thioridazine    What if I miss a dose?   If you miss a dose, take it as soon as you can. If it is almost time for your next dose, take only that dose. Do not take double or extra doses.   Where should I keep my medicine?   Keep out of the reach of children. This medicine can be abused. Keep your medicine in a safe place to protect it from theft. Do not share this medicine with anyone. Selling or giving away this medicine is dangerous and against the law.   This medicine may cause accidental overdose and death if it taken by other adults,  children, or pets. Mix any unused medicine with a substance like cat litter or coffee grounds. Then throw the medicine away in a sealed container like a sealed bag or a coffee can with a lid. Do not use the medicine after the expiration date.   Store at room temperature between 20 and 25 degrees C (68 and 77 degrees F).   What should I tell my health care provider before I take this medicine?   They need to know if you have any of these conditions:      brain tumor      Crohn's disease, inflammatory bowel disease, or ulcerative colitis      drug abuse or addiction      head injury      heart or circulation problems      if you often drink alcohol      kidney disease or problems going to the bathroom      liver disease      lung disease, asthma, or breathing problems      an unusual or allergic reaction to acetaminophen, oxycodone, other opioid analgesics, other medicines, foods, dyes, or preservatives      pregnant or trying to get pregnant      breast-feeding    What should I watch for while using this medicine?   Tell your doctor or health care professional if your pain does not go away, if it gets worse, or if you have new or a different type of pain. You may develop tolerance to the medicine. Tolerance means that you will need a higher dose of the medication for pain relief. Tolerance is normal and is expected if you take this medicine for a long time.   Do not suddenly stop taking your medicine because you may develop a severe reaction. Your body becomes used to the medicine. This does NOT mean you are addicted. Addiction is a behavior related to getting and using a drug for a non-medical reason. If you have pain, you have a medical reason to take pain medicine. Your doctor will tell you how much medicine to take. If your doctor wants you to stop the medicine, the dose will be slowly lowered over time to avoid any side effects.   There are different types of narcotic medicines (opiates). If you take  more than one type at the same time or if you are taking another medicine that also causes drowsiness, you may have more side effects. Give your health care provider a list of all medicines you use.  Your doctor will tell you how much medicine to take. Do not take more medicine than directed. Call emergency for help if you have problems breathing or unusual sleepiness.   Do not take other medicines that contain acetaminophen with this medicine. Always read labels carefully. If you have questions, ask your doctor or pharmacist.   If you take too much acetaminophen get medical help right away. Too much acetaminophen can be very dangerous and cause liver damage. Even if you do not have symptoms, it is important to get help right away.   You may get drowsy or dizzy. Do not drive, use machinery, or do anything that needs mental alertness until you know how this medicine affects you. Do not stand or sit up quickly, especially if you are an older patient. This reduces the risk of dizzy or fainting spells. Alcohol may interfere with the effect of this medicine. Avoid alcoholic drinks.   The medicine will cause constipation. Try to have a bowel movement at least every 2 to 3 days. If you do not have a bowel movement for 3 days, call your doctor or health care professional.   Your mouth may get dry. Chewing sugarless gum or sucking hard candy, and drinking plenty or water may help. Contact your doctor if the problem does not go away or is severe.          NOTE:This sheet is a summary. It may not cover all possible information. If you have questions about this medicine, talk to your doctor, pharmacist, or health care provider. Copyright 2017 [employer]                               Dr. Zachary Charon  [address]  [place], [place] [address]  [phone]    After your Surgery:  Frequent Questions or Concerns     Pain in the back of neck and/or between your shoulder blades: It is not uncommon to experience this type  of pain post-operatively from an ACDF surgery. This is due to opening up the collapsed disc space. This usually improves over the next 4-6 weeks.     Discomfort with swallowing/ Voice hoarseness: It is not uncommon to feel like you "have a lump in your throat", experience pain with swallowing, or have voice hoarseness. This should improve over the next couple of months. These are common side effects related to retraction from the surgery.     Ongoing numbness or tingling: Your preoperative pain may be improved or gone, but you can feel more numbness in the earlier days after surgery. This is common. Before surgery the pain was interfering with other sensations that may be more noticeable now. Please be aware there may always be some residual numbness.    Activity:   No strenuous activity.    You may walk as you are able. Slowly increasing your distance for up to 30 minutes. This should be your only form of exercise in the first two weeks after surgery.   Limit bending and turning of neck. NO overhead work or looking up.   Do not lift more than 10 pounds (a gallon of milk)   If instructed to wear a collar, you will need to wear it at all times except when you shower.  You may take the collar off during the shower and put it on afterwards.     You may shower the day after surgery.  A  therapist will review ways to make showering and other daily tasks safer and easier after surgery.     DO NOT drive until allowed by your doctor at your office visit. You may ride as a passenger, but avoid sitting for prolonged periods of time.      Incision Care:   Cover the incision with plastic wrap or foam tape before getting in shower.   Remove the bandage and allow to dry. You do not need to apply a new bandage.   If you have stitches, they will be removed at your follow up appointment within 10-24 days. If you have dermabond, allow it to dissolve on its own.    Steri-strips (small strips of surgical tape) or Dermabond  ("skin glue") have been applied in the operating room to reinforce the incision.    Steri strips will fall off on their own, DO NOT rip off. Dermabond will 'peel off', DO NOT scrub.   No creams, ointments, or powders are to be applied to incision.   Do not submerge incision under water. No bathtubs or swimming until instructed by doctor.    Diet:   You do not have new diet restriction.   Drink plenty of water and eat foods high in fiber (fruits and vegetables).    Managing You Pain:   You will have a prescription for medicine to take a home. Please read the instructions for taking this medication and the possible side effects.   DO NOT drive while still taking pain medicine. Medication can make you sleepy.   It is recommended that you take stool softeners and drink plenty of water. Medication can cause constipation.   Ice packs may be applied to incision site to help with discomfort and swelling. NO hot packs on the incision.   You will have some discomfort at the incision as a result of the operation. Incision pain can last for several weeks, but should improve over time, not get worse.  Your pain before surgery may be better or gone.    It is normal to feel more numbness in the early days after surgery.      Medications:   DO NOT take any non-steroidal anti-inflammatory medications (NSAIDS) such as: Motrin, Ibuprofen, Aleve, Advil, Naprosyn, Mobic, Voltaren, or Celebrex for the next 3 months or until okay with your doctor.  These medications can interfere with bony fusion.     Do not take aspirin, aspirin products, fish oil, or blood thinners for 1 week after surgery. After 1 week, you may resume taking aspirin, aspirin products, fish oil, or blood thinners.       The on-call physician will not refill any prescriptions at night or on weekends.    Call You Doctor for:   Temperature higher than 100.5 degrees   Redness, drainage, or increased swelling at the incision site.   Increased difficulty  walking   Increased pain, numbness, or weakness in your arms or legs.  Smoking     Do NOT smoke. Nicotine interferes with the fusion process which can impact the whether the surgery is successful or not. Contact your primary care doctor if you need assistance to quit smoking.    Follow-up:    Call [phone] to confirm your appointment.   If you have staples or sutures schedule visit for 10 - 14 days after surgery. If not, you will be scheduled for a visit 2-3 weeks after surgery.  IS IT A STROKE? Act FAST and Check for these signs:    FACE                         Does the face look uneven?    ARM                         Does one arm drift down?    SPEECH                    Does their speech sound strange?    TIME                         Call 9-1-1 at any sign of stroke  ---------------------------------------------------------------------------------------------------------------------------  Heart Attack Signs  Chest discomfort: Most heart attacks involve discomfort in the center of the chest and lasts more than a few minutes, or goes away and comes back. It can feel like uncomfortable pressure, squeezing, fullness or pain.  Discomfort in upper body: Symptoms can include pain or discomfort in one or both arms, back, neck, jaw or stomach.  Shortness of breath: With or without discomfort.  Other signs: Breaking out in a cold sweat, nausea, or lightheaded.  Remember, MINUTES DO MATTER. If you experience any of these heart attack warning signs, call 9-1-1 to get immediate medical attention!     ---------------------------------------------------------------------------------------------------------------------------  [facility] allows you to manage your health, view your test results, and retrieve your discharge documents from your hospital stay securely and conveniently from your computer.  To begin the enrollment process, visit [email or web address] Click on "Sign up now" under [facility].

## 2017-05-07 NOTE — Nursing Note (Signed)
Nursing Discharge Summary - Text       Nursing Discharge Summary Entered On:  05/07/2017 11:00 EDT    Performed On:  05/07/2017 10:59 EDT by Anice Paganini, RN, KATHERINE A               DC Information   Discharge To, Anticipated :   Home with family support   Transportation :   Private vehicle   Accompanied By :   Conception Chancy of Discharge :   Wheelchair   KREIDER, RN, KATHERINE A - 05/07/2017 10:59 EDT   Education   Responsible Learner(s) :   Living Situation: Home with family support        Performed by: Leota Jacobsen A - 05/06/2017 17:34     Home Caregiver Present for Session :   Yes   Barriers To Learning :   None evident   Teaching Method :   Demonstration, Explanation, Printed materials   Anice Paganini, RN, KATHERINE A - 05/07/2017 10:59 EDT   Post-Hospital Education Adult Grid   Activity Expectations :   [place] Gammon understanding, Demonstrates   Bladder Management :   Verbalizes understanding, Demonstrates   Bowel Management :   Verbalizes understanding, Demonstrates   Diagnostic Results :   Verbalizes understanding, Demonstrates   Disease Process :   Verbalizes understanding, Demonstrates   Equipment/Devices :   Verbalizes understanding, Demonstrates   Importance of Follow-Up Visits :   Verbalizes understanding, Demonstrates   Pain Management :   Verbalizes understanding, Demonstrates   Physical Limitations :   Verbalizes understanding, Demonstrates   Plan of Care :   [employer] understanding, Demonstrates   Postoperative Instructions :   TEFL teacher understanding, Demonstrates   When to Call Health Care Provider :   [employer] understanding, Demonstrates   Anice Paganini, RN, Felix Pacini - 05/07/2017 10:59 EDT   Health Maintenance Education Adult Grid   Bathing/Hygiene :   TEFL teacher understanding, Demonstrates   Diet/Nutrition :   TEFL teacher understanding, Priscille Loveless, RN, Felix Pacini - 05/07/2017 10:59 EDT   Medication Education Adult Grid   Drug to Drug Interactions :   Verbalizes understanding, Hydrologist, Medication :   TEFL teacher understanding, Priscille Loveless, RN, Felix Pacini - 05/07/2017 10:59 EDT   Safety Education Adult Grid   Safety, Fall :   Verbalizes understanding, Demonstrates   Anice Paganini, RN, Natalia Leatherwood A - 05/07/2017 10:59 EDT   Additional Learner(s) Present :   Spouse   Time Spent Educating Patient :   30 minutes   Anice Paganini, RN, KATHERINE A - 05/07/2017 10:59 EDT

## 2017-05-07 NOTE — Nursing Note (Signed)
Medication Administration Follow Up-Text       Medication Administration Follow Up Entered On:  05/07/2017 0:12 EDT    Performed On:  05/07/2017 0:12 EDT by Olivia Mackie, RN, Jodelle Red      Intervention Information:     acetaminophen-oxycodone  Performed by Olivia Mackie RN, Jodelle Red on 05/06/2017 22:51:00 EDT       oxyCODONE-acetaminophen,2tabs  Oral,moderate pain (4-7)       Med Response   ED Medication Response :   Symptoms improved   Harriet Masson - 05/07/2017 0:12 EDT

## 2017-05-07 NOTE — Nursing Note (Signed)
Medication Administration Follow Up-Text       Medication Administration Follow Up Entered On:  05/07/2017 5:04 EDT    Performed On:  05/07/2017 5:04 EDT by Olivia Mackie, RN, Jodelle Red      Intervention Information:     acetaminophen-oxycodone  Performed by Harriet Masson on 05/07/2017 04:01:00 EDT       oxyCODONE-acetaminophen,2tabs  Oral,moderate pain (4-7)       Med Response   ED Medication Response :   Symptoms improved   Harriet Masson - 05/07/2017 5:04 EDT

## 2017-05-07 NOTE — Procedures (Signed)
 IntraOp Record - S[facility]             IntraOp Record - [facility] Summary                                                                   Primary Physician:        TARAS ZACHARY DEL    Case Number:              [id number]    Finalized Date/Time:      05/13/17 15:48:09    Pt. Name:                 Kurt Mosley, Kurt Mosley    D.O.B./Sex:               [birth year]    Male    Med Rec #:                [id number]    Physician:                TARAS ZACHARY DEL    Financial #:              [id number]    Pt. Type:                 I    Room/Bed:                 0514/01    Admit/Disch:              05/06/17 10:43:00 -                              05/07/17 12:15:00    Institution:       [facility] - Case Times                                                                                                         Entry 1                                                                                                          Patient      In Room Time             05/06/17 12:50:00               Out Room Time  05/06/17 14:14:00    Anesthesia     Procedure      Start Time               05/06/17 13:17:00               Stop Time                       05/06/17 14:06:00    Last Modified By:         GERALENE OBIE ANDREA LELON 05/06/17 14:20:00      S[facility] - Case Times Audit                                                                          05/06/17 14:20:00         Owner: [employer]                              Modifier: TANNERM                                                       <+> 1         Out Room Time     05/06/17 14:07:00         Owner: [employer]                              Modifier: MIDGE                                                       <+> 1         Stop Time     05/06/17 13:18:16         Owner: [employer]                              Modifier: MIDGE                                                       <+> 1         Start Time        [facility] - Case Attendance  Entry 1                         Entry 2                         Entry 3                                          Case Attendee             KHOURY-MD,  ZACHARY VEAR SNIDE, RN, MELANIE W        PINER,  KATHRYN P    Role Performed            Surgeon Primary                 Circulator                      Surgical Scrub    Time In                   05/06/17 12:50:00               05/06/17 12:50:00               05/06/17 12:50:00    Time Out     Procedure                 Anterior Cervical               Anterior Cervical               Anterior Cervical                              Discectomy with Fusion          Discectomy with Fusion          Discectomy with Fusion    Last Modified By:         SNIDE RN, ANDREA SNIDE, RN, ANDREA SNIDE, RN, [facility]                              W 05/06/17 13:13:11             W 05/06/17 13:13:11             W 05/06/17 13:13:11                                Entry 4                         Entry 5                         Entry 6                                          Case  Attendee             BURNETTE-MD,  JEFFREY           HENSLEY-CRNA,                   SARGEANT,  SYDNEY A                              DEAN                            CHRISTOPHER    Role Performed            Anesthesiologist                CRNA                            Radiology Tech    Time In                   05/06/17 12:50:00               05/06/17 12:50:00               05/06/17 12:50:00    Time Out     Procedure                 Anterior Cervical               Anterior Cervical               Anterior Cervical                              Discectomy with Fusion          Discectomy with Fusion          Discectomy with Fusion    Last Modified By:         GERALENE RN, ANDREA GERALENE, RN, ANDREA GERALENE, RN, MELANIE                              W 05/06/17 13:13:11             W 05/06/17  13:13:11             W 05/06/17 13:13:11                                Entry 7                                                                                                          Case Attendee             MICHALENE,  SUMMER A    Role Performed            First Assistant    Time In                   05/06/17 12:50:00    Time Out     Procedure                 Anterior Cervical                              Discectomy with Fusion    Last Modified By:         GERALENE RN, MELANIE                              W 05/06/17 13:13:11    General Comments:            HADASSAH FAST - MONITORING      SFOR - Case Attendance Audit                                                                     05/06/17 13:13:11         Owner: [employer]                              Modifier: TANNERM                                                           1     <*> Procedure            1     <*> Procedure            1     <*> Procedure                              Anterior Cervical Discectomy with Fusion            1     <*> Procedure                              Anterior Cervical Discectomy with Fusion            1     <*> Procedure                              Anterior Cervical Discectomy with Fusion            2     <*> Time In            2     <*> Time In            2     <*> Procedure  2     <*> Procedure            2     <*> Procedure                              Anterior Cervical Discectomy with Fusion            2     <*> Procedure                              Anterior Cervical Discectomy with Fusion            2     <*> Procedure                              Anterior Cervical Discectomy with Fusion            3     <*> Time In            3     <*> Time In            3     <*> Procedure            3     <*> Procedure            3     <*> Procedure                              Anterior Cervical Discectomy with Fusion            3     <*> Procedure                              Anterior Cervical Discectomy with Fusion             3     <*> Procedure                              Anterior Cervical Discectomy with Fusion            4     <*> Time In            4     <*> Time In            4     <*> Procedure            4     <*> Procedure            4     <*> Procedure                              Anterior Cervical Discectomy with Fusion            4     <*> Procedure                              Anterior Cervical Discectomy with Fusion            4     <*> Procedure  Anterior Cervical Discectomy with Fusion            5     <*> Time In            5     <*> Time In            5     <*> Procedure            5     <*> Procedure            5     <*> Procedure                              Anterior Cervical Discectomy with Fusion            5     <*> Procedure                              Anterior Cervical Discectomy with Fusion            5     <*> Procedure                              Anterior Cervical Discectomy with Fusion            6     <*> Time In            6     <*> Time In            6     <*> Procedure            6     <*> Procedure            6     <*> Procedure                              Anterior Cervical Discectomy with Fusion            6     <*> Procedure                              Anterior Cervical Discectomy with Fusion            6     <*> Procedure                              Anterior Cervical Discectomy with Fusion            7     <*> Case Attendee                          LANDERS-PA,  SUMMER A            7     <*> Case Attendee                          LANDERS-PA,  SUMMER A            7     <*> Time In            7     <*> Time In  7     <*> Procedure            7     <*> Procedure            7     <*> Procedure                              Anterior Cervical Discectomy with Fusion            7     <*> Procedure                              Anterior Cervical Discectomy with Fusion            7     <*> Procedure                              Anterior Cervical Discectomy with  Fusion     05/06/17 12:58:17         Owner: [employer]                              Modifier: TANNERM                                                           1     <*> Time In            1     <*> Time In            1     <*> Time In            1     <*> Procedure            1     <*> Procedure                              Anterior Cervical Discectomy with Fusion            1     <*> Procedure                              Anterior Cervical Discectomy with Fusion            1     <*> Procedure                              Anterior Cervical Discectomy with Fusion            1     <*> Procedure                              Anterior Cervical Discectomy with Fusion            2     <*> Case Attendee  GERALENE, RN, MELANIE W            2     <*> Case Attendee                          GERALENE, RN, MELANIE W            2     <*> Case Attendee                          GERALENE, RN, MELANIE W            2     <*> Role Performed            2     <*> Role Performed            2     <*> Role Performed            2     <*> Procedure            3     <*> Case Attendee                          PINER,  KATHRYN P            3     <*> Case Attendee                          PINER,  KATHRYN P            3     <*> Case Attendee                          PINER,  KATHRYN P            3     <*> Role Performed            3     <*> Role Performed            3     <*> Role Performed            3     <*> Procedure            4     <*> Case Attendee                          BURNETTE-MD,  JEFFREY DEAN            4     <*> Case Attendee                          BURNETTE-MD,  JEFFREY DEAN            4     <*> Case Attendee                          BURNETTE-MD,  JEFFREY DEAN            4     <*> Role Performed            4     <*> Role Performed            4     <*> Role Performed  4     <*> Procedure            5     <*> Case Attendee                          HENSLEY-CRNA,  CHRISTOPHER            5     <*> Case  Attendee                          HENSLEY-CRNA,  CHRISTOPHER            5     <*> Case Attendee                          HENSLEY-CRNA,  CHRISTOPHER            5     <*> Role Performed            5     <*> Role Performed            5     <*> Role Performed            5     <*> Procedure            6     <*> Case Attendee                          SARGEANT,  SYDNEY A            6     <*> Case Attendee                          SARGEANT,  SYDNEY A            6     <*> Case Attendee                          SARGEANT,  SYDNEY A            6     <*> Role Performed            6     <*> Role Performed            6     <*> Role Performed            6     <*> Procedure            7     <*> Role Performed            7     <*> Role Performed            7     <*> Role Performed            7     <*> Procedure        [facility] - Skin Assessment  Pre-Care Text:            A.240 Assesses baseline skin condition Im.120 Implements protective measures to prevent skin or tissue injury           due to mechanical sources  Im.280.1 Implements progective measures to prevent skin or tissue injury due to           thermal sources Im.360 Monitors for signs and symptons of infection                              Entry 1                                                                                                          Skin Integrity            Intact    Last Modified By:         GERALENE, RN, MELANIE                              W 05/06/17 12:59:38    Post-Care Text:            E.10 Evaluates for signs and symptoms of physical injury to skin and tissue E.270 Evaluate tissue perfusion           O.60 Patient is free from signs and symptoms of injury caused by extraneous objects   O.210 Patinet's tissue           perfusion is consistent with or improved from baseline levels      [facility] - Patient Positioning                                                                       Pre-Care Text:            A.240 Assesses baseline skin condition A.280 Identifies baseline musculoskeletal status A.280.1 Identifies           physical alterations that require additional precautions for procedure-specific positioning A.510.8 Maintains           patient's dignity and privacy Im.120 Implements protective measures to prevent skin/tissue injury due to           mechanical sources Im.40 Positions the patient Im.80 Applies safety devices                              Entry 1  Procedure                 Anterior Cervical               Body Position                   Supine                              Discectomy with Fusion    Left Arm Position         Tucked and Padded at            Right Arm Position              Tucked and Padded at                              Side                                                            Side    Left Leg Position         Extended Security               Right Leg Position              Extended Security                              Strap, Pillow Under Knee                                        Strap, Pillow Under Knee    Feet Uncrossed            Yes                             Pressure Points                 Yes                                                              Checked     Positioning Device        Roll Shoulder, Heel             Positioned By                   GERALENE, RN, MELANIE                              pad, Head Rest Donut  W, HENSLEY-CRNA,                                                                                              CHRISTOPHER, KHOURY-MD,                                                                                               GEORGE H    Outcome Met (O.80)        Yes    Last Modified By:         GERALENE, RN, MELANIE                              W 05/06/17 13:03:15    Post-Care  Text:            E.10 Evaluates for signs and symptoms of physical injury to skin and tissue E.290 Evaluates musculoskeletal           status O.80 Patient is free from signs and symptoms of injury related to positioning O.120 the patient is free           from signs and symptoms of injury related to transfer/transport  O.250 Patient's musculoskeletal status is           maintained at or improved from baseline levels    General Comments:            GEL DONUT. ARMS PADDED AND PAPOOSED. SAFETY STRAP PADDED ACROSS THIGHS. PILLOWS UNDER KNEES. HEELS ON EGGCRATE.           ALL POSITIONING DONE WITH THE ASSISTANCE AND APPROVAL OF DR. KHOURY.      [facility] - Skin Prep                                                                                Pre-Care Text:            A.30 Verifies allergies A.20 Verifies procedure, surgical site, and laterality A.510.8 Maintains paritnet's           dignity and privacy Im.270 Performs Skin Preparation Im.270.1 Implements protective measures to prevent skin           and tissue injury due to chemical sources  A.300.1 Protects from cross-contamination  Entry 1                                                                                                          Hair Removal     Skin Prep      Prep Agents (Im.270)     Povidone Iodine Scrub           Prep Area (Im.270)              Neck, Chest                              7.5%, Povidone Iodine                              Solution 10%,                              Chlorhexidine Gluconate                              2% w/Alcohol     Prep By                  GERALENE, RN, MELANIE W    Outcome Met (O.100)       Yes    Last Modified By:         GERALENE, RN, MELANIE                              W 05/06/17 13:03:24    Post-Care Text:            E.10 Evaluates for signs and symptoms of physical injury to skin and tissue O.100 Patient is free from signs           and symptoms of chemical injury  O.740 The patient's  right to privacy is maintained      [facility] - Counts Initial and Final                                                                 Pre-Care Text:            A.20 Verifies operative procedure, sugical site, and laterality A.20.2 Assesses the risk for unintended           retained foreign body Im.20 Performs required counts                              Entry 1  Initial Counts      Initial Counts           MACDONALD, RN, MELANIE          Items included in               Sponges, Sharps     Performed By             LELON SORENSON,  KATHRYN P            the Initial Count     Final Counts      Final Counts             GERALENE, RN, MELANIE          Final Count Status              Correct     Performed By             W, PINER,  KATHRYN P     Items Included in        Sponges, Sharps     Final Count     Outcome Met (O.20)        Yes    Last Modified By:         GERALENE RN, ANDREA                              W 05/06/17 13:56:41    Post-Care Text:            E.50 Evaluates results of the surgical count O.20 Patient is free from unintended retained foreign objects      [facility] - Counts Initial and Final Audit                                                            05/06/17 13:56:41         Owner: [employer]                              Modifier: TANNERM                                                       <+> 1         Final Count Status        <+> 1         Outcome Met (O.20)        [facility] - Counts Additional                                                                        Pre-Care Text:            A.20 Verifies operative procedure, sugical site, and laterality A.20.2 Assesses the risk for unintended  retained foreign body Im.20 Performs required counts                              Entry 1                                                                                                          Additional Count           Closing Count                   Additional Count                MACDONALD, RN, MELANIE    Type                                                      Participants                    W, PINER,  KATHRYN P    Count Status              Correct                         Items Counted                   Sponges, Sharps    Outcome Met (O.20)        Yes    Last Modified By:         GERALENE RN, MELANIE                              W 05/06/17 13:56:47    Post-Care Text:            E.50 Evaluates results of the surgical count O.20 Patient is free from unintended retained foreign objects      [facility] - Counts Additional Audit                                                                   05/06/17 13:56:47         Owner: [employer]                              Modifier: [facility]                                                       <+>  1         Count Status        <+> 1         Outcome Met (O.20)        S[facility] - General Case Data                                                                        Pre-Care Text:            A.350.1 Classifies surgical wound                              Entry 1                                                                                                          Case Information      ASA Class                3                               Case Level                      Level 4     OR                       SF 10                           Specialty                       Neurosurgery (SN)     Wound Class              1-Clean    Preop Diagnosis           CERVICAL STENOSIS               Postop Diagnosis                SAME    Last Modified By:         GERALENE RN, ANDREA                              W 05/06/17 13:12:57    Post-Care Text:            O.760 Patient receives consistent and comparable care regardless of the setting      [facility] - General Case Data Audit  05/06/17 13:12:57         Owner: [employer]                               Modifier: [facility]                                                       <+> 1         ASA Class        SFOR - Fire Risk Assessment                                                                                               Entry 1                                                                                                          Fire Risk                 Alcohol Based Prep              Fire Risk Score                 3    Assessment: If            Solution, Surgical Site    checked, checkmark        Above Xiphoid, Ignition    = 1 point                 Source In Use    Last Modified By:         GERALENE, RN, ANDREA                              W 05/06/17 13:04:27      S[facility] - Time Out                                                                                 Pre-Care Text:            A.10 Confirms patient identity A.20 Verifies operative procedure, surgical site, and laterality A.20.1  Verifies           consent for planned procedure A.30 Verifies allergies                              Entry 1                                                                                                          Procedure                 Anterior Cervical               Is everyone ready               Yes                              Discectomy with Fusion          to perform time out     Have all members of       Yes                             Patient name and                Yes    the surgical team                                         DOB confirmed     been introduced     Allergies discussed       Yes                             Surgical procedure              Yes                                                              to be performed                                                               confirmed and  verified by                                                               completed surgical                                                                consent     Correct surgical          Yes                             Correct laterality              Yes    site marked and                                           confirmed     initials are     visible through     prepped and draped     field (or     alternative ID band     used)     Correct patient           Yes                             Surgeon shares                  Yes    position confirmed                                        operative plan,                                                               possible                                                               difficulties,                                                               expected duration,  anticipated blood                                                               loss and reviews                                                               all                                                               critical/specific                                                               concerns     Required blood            Yes                             Essential imaging               Yes    products, implants,                                       available and fetal     devices and/or                                            heartones confirmed     special equipment                                         (if applicable)     available and     sterility confirmed     VTE prophylaxis           Yes                             Antibiotics ordered             Yes    addressed                                                 and administered     Anesthesia shares  Yes                             Fire risk                       Yes    anesthetic plan and                                       assessment scored     reviews patient                                           and plan discussed     specific concerns     Appropriate drying        Yes                              Surgeon states:                 Yes    time for prep                                             Does anyone have     observed before                                           any concerns? If     draping                                                   you see, suspect,                                                               or feel that                                                               patient care is                                                               being compromised,  speak up for                                                               patient safety     Time Out Complete         05/06/17 13:16:00    Last Modified By:         GERALENE RN, MELANIE                              W 05/06/17 13:18:08    Post-Care Text:            E.30 Evaluates verification process for correct patient, site, side, and level surgery      [facility] - Debrief                                                                                  Pre-Care Text:            Im.330 Manages specimen handling and disposition                              Entry 1                                                                                                          Procedure                 Anterior Cervical               Final counts                    Yes                              Discectomy with Fusion          correct and                                                               verbally verified  with                                                               surgeon/licensed                                                               independent                                                               practitioner (if                                                               applicable)     Actual procedure          Yes                              Postop diagnosis                Yes    performed confirmed                                       confirmed     Wound                     Yes                             Confirm specimens               Yes    classification                                            and specimens     confirmed                                                 labeled                                                               appropriately (if  applicable)     Foley catheter            Yes                             Patient recovery                Yes    removed (if                                               plan confirmed     applicable)     Debrief Complete          05/06/17 13:57:00    Last Modified By:         GERALENE RN, MELANIE                              W 05/06/17 13:57:44    Post-Care Text:            E.800 Ensures continuity of care E.50 Evaluates results of the surgical count O.30 Patient's procedure is           performed on the correct site, side, and level O.50 patient's current status is communicated throughout the           continuum of care O.40 Patient's specimen(s) is managed in the appropriate manner      S[facility] - Cautery                                                                                  Pre-Care Text:            A.240 Assesses baseline skin condition A280.1 Identifies baseline musculoskeletal status Im.50 Implements           protective measures to prevent injury due to electrical sources  Im.60 Uses supplies and equipment within safe           parameters Im.80 Applies safety devices                              Entry 1                         Entry 2                                                                          ESU Type                  GENERATOR                       GENERATOR CODMAN  COVIDIEN/VALLEYLAB              BIPOLAR W/IRRIG    Identification            [id number]                           [id number]    Number     Coag Setting (watts)      25    Cut Setting (watts)       0    Bipolar Setting                                           45    (watts)     Blend/Effect     Setting (watts)     Argon Plasma     Coagulator Mode     Flow Rate/Power     Level     Endocut     Endocut Energy     (watts)     Grounding Pad             Yes                             No    Needed?     Grounding Pad Site        Thigh, left    Grounding Pad             MACDONALD, RN, MELANIE W    Applied By     [employer]     Outcome Met (O.10)        Yes                             Yes    Last Modified By:         GERALENE RN, ANDREA GERALENE, RN, MELANIE                              W 05/06/17 13:12:43             W 05/06/17 13:12:44    Post-Care Text:            E.10 Evaluates for signs and symptoms of physical injury to skin and tissue O.10 Patient is free from signs and           symptoms of injury related to thermal sources  O.70 Patient is free from signs and symptoms of electrical injury      S[facility] - Cautery Audit                                                                             05/06/17 13:12:44         Owner: MIDGE  Modifier: TANNERM                                                           2     <*> Identification Number            2     <*> Identification Number            2     <*> Bipolar Setting (watts)            2     <*> Bipolar Setting (watts)            2     <*> Grounding Pad Needed?            2     <*> Grounding Pad Needed?            2     <*> Outcome Met (O.10)            2     <*> Outcome Met (O.10)        S[facility] - Patient Care Devices                                                                     Pre-Care Text:            A.200 Assesses risk for normothermia regulation A.40 Verifies presence of prosthetics or corrective devices           Im.280 Implements thermoregulation measures Im.60 Uses supplies and equipment within safe parameters                               Entry 1                                                                                                          Equipment Type            MACHINE SEQUENTIAL              SCD Sleeve Site                 Legs Bilateral                              COMPRESSION    Equipment/Tag Number      [id number]                          Initiated Pre  Yes                                                              Induction     Last Modified By:         GERALENE, RN, MELANIE                              W 05/06/17 13:12:17    Post-Care Text:            E.10 Evaluates signs and symptoms of physical injury to skin and tissue O.60 Patient is free from signs and           symptoms of injury caused by extraneous objects      [facility] - Medications                                                                              Pre-Care Text:            A.10 Confirms patient identity A.30 Verifies allergies Im.220 Administers prescribed medications Im.220.2           Administers prescribed antibiotic therapy as ordered                              Entry 1                         Entry 2                         Entry 3                                          Time Administered     Medication                THROMBIN TOPICAL VIAL           GELFOAM STERILE POWDER          BACITRACIN IRRIGATION                              5000 UNITS TOPICAL VIAL         1 GM                            500CC    Route of Admin            Topical                         Topical  Irrigation    Dose/Volume               5000 units    (include amount and     unit of measure)     Site                      Neck                            Neck                            Neck    Site Detail     Administered By           TARAS ZACHARY VEAR TARAS,  GEORGE H            KHOURY-MD,  GEORGE H    Outcome Met (O.130)       Yes                             Yes                             Yes    Last Modified By:          GERALENE, RN, ANDREA GERALENE, RN, ANDREA GERALENE, RN, MELANIE                              W 05/06/17 13:13:32             W 05/06/17 13:13:32             W 05/06/17 13:13:32                                Entry 4                                                                                                          Time Administered     Medication                LIDOCAINE 1% W/EPI                              VIAL    Route of Admin            Local Injection    Dose/Volume                  (include amount and     unit of measure)     Site  Neck    Site Detail     Administered By           TARAS BARE H    Outcome Met (O.130)       Yes    Last Modified By:         GERALENE, RN, MELANIE                              W 05/06/17 13:18:23    Post-Care Text:            E.20 Evaluates response to medications O.130 Patient receives appropriately administerd medication(s)      [facility] - Medications Audit                                                                         05/06/17 13:18:23         Owner: MIDGE                              Modifier: MIDGE                                                       <+> 4         Medication        <+> 4         Route of Admin        <+> 4         Administered By        <+> 4         Dose/Volume (include amount and                      unit of measure)        <+> 4         Outcome Met (O.130)        <+> 4         Site        S[facility] - Implants/Endoscopy Stents                                                                Pre-Care Text:            A.20 Verifies operative procedure, surgical site, and laterality A.20.1 Verifies consent for planned procedure           Im.350 Records implants inserted during the operative or invasive procedure                              Entry 1  Entry 2                         Entry 3                                          Implant/Explant           Implant                          Implant                         Implant    Catalog #                [id number]                          [id number]                       [id number]    Implant     Identification      Description              BONE MATRIX CELLULAR            PLATE CERVICAL PEEK             SPACER CERVICAL PEEK                              TRINITY ELITE 1.2CC             COALITION AGXRP X          COALITION AGXR 7 DEG                                                              GLOBUS 1128.2209            X X GLOBUS                                                                                              6871.8790     Expiration Date          12/30/18     Lot Number      Unique ID Number         [id number]     Manufacturer             [employer]                        [employer]                  [employer]  Serial Number            [id number]    Usage Data      Implant Site             NECK                            NECK                            NECK     Quantity                 1                               1                               1     Last Modified By:         GERALENE RN, ANDREA GERALENE, RN, ANDREA GERALENE, RN, ANDREA ORN 05/06/17 13:25:13             W 05/06/17 13:56:33             W 05/06/17 13:56:33                                Entry 4                                                                                                          Implant/Explant           Implant    Catalog #                [id number]    Implant     Identification      Description              SCREW CERVICAL                              COALITION VARIABLE                              ANGLE 3.6MM X S/D                              GLOBUS 184.154     Expiration Date      Lot Number      Unique ID Number  Manufacturer             [employer]     Serial Number     Usage Data      Implant Site             NECK     Quantity                  2    Last Modified ByBETHA SNIDE, RN, ANDREA ORN 05/06/17 13:56:33    Post-Care Text:            E.30 Evaluates verification process for correct patient, site, side and level surgery O.30 Patient's procedure           is performed on the correct site, side, and level      [facility] - Implants/Endoscopy Stents Audit                                                           05/06/17 13:56:33         Owner: MIDGE                              Modifier: MIDGE                                                       <+> 2         Description        <+> 2         Manufacturer        <+> 2         Implant Site        <+> 2         Quantity        <+> 2         Catalog #        <+> 2         Implant/Explant        <+> 3         Description        <+> 3         Manufacturer        <+> 3         Implant Site        <+> 3         Quantity        <+> 3         Catalog #        <+> 3         Implant/Explant        <+> 4         Description        <+> 4         Manufacturer        <+> 4  Implant Site        <+> 4         Quantity        <+> 4         Catalog #        <+> 4         Implant/Explant        S[facility] - Tubes, Drains, Catheters                                                                 Pre-Care Text:            A.310 Identifies factors associated with an increased risk for hemorrhage or fluid and electrolyte imbalance           Im.250 Administers care to invasive device sites                              Entry 1                                                                                                          Device Description        DRAIN CLOSED WOUND              Location                        Neck                              SUCTION HEMOVAC SMALL                              W/ TROCAR    Last Modified By:         GERALENE, RN, ANDREA                              W 05/06/17 13:13:56    Post-Care Text:            E.340 Evaluates tubes and drains are intact  and functioning as planned O.60 Patient is free from signs and           symptoms of injury caused by extraneous objects      SFOR - Spinal Fusion Level  Entry 1                                                                                                          Actual Spinal             C3-C4                           # of Spinal Levels             Cervical 1 Level    Levels Fused                                              Fused     Last Modified By:         GERALENE RN, ANDREA ORN 05/06/17 13:13:44      S[facility] - Dressing/Packing                                                                         Pre-Care Text:            A.350 Assesses susceptibility for infection Im.250 Administers care to invasive devices Im.290 Administer care           to wound sites  Im.300 Implements aseptic technique                              Entry 1                                                                                                          Site                      Neck    Dressing Item     Details      Dressing Item            Liquid Bandage,     (Im.290)  Non-Adhesive Dressing,                              Tape    Last Modified By:         GERALENE, RN, MELANIE                              W 05/06/17 13:04:37    Post-Care Text:            E.320 Evaluate factors associted with increased risk for postoperative infection at the completion of the           procedure O.200 Patient's wound perfusion is consistent with or improved from baseline levels  O.Patient is           free from signs and symptoms of infection      S[facility] - Procedures                                                                               Pre-Care Text:            A.20 Verifies operative procedure, surgical site, and laterality Im.150 Develops individualized plan of care                              Entry  1                                                                                                          Procedure     Description      Procedure                Anterior Cervical               Surgical Procedure              1 LEVEL ACDF, C3/4                              Discectomy with Fusion          Text                               and/or Stabilization                              SCIP    Primary Procedure         Yes  Primary Surgeon                 TARAS ZACHARY DEL    Start                     05/06/17 13:17:00               Stop                            05/06/17 14:06:00    Anesthesia Type           General                         Surgical Service                Neurosurgery (SN)    Wound Class               1-Clean    Last Modified By:         GERALENE RN, ANDREA ORN 05/06/17 14:07:02    Post-Care Text:            O.730 The patient's care is consistent with the individualized perioperative plan of care      [facility] - Transfer                                                                                                           Entry 1                                                                                                          Transferred By            GERALENE, RN, MELANIE          Via                             Bed                              W, HENSLEY-CRNA,                              CHRISTOPHER    Post-op Destination       PACU  Skin Assessment     Last Modified By:         GERALENE, RN, MELANIE                              W 05/06/17 13:14:01      Case Comments                                                                                         <None>              Finalized By: PINKEY FELT      Document Signatures                                                                             Signed By:           WILLMA TAFFY LABOR 05/06/17 22:39          LLOYD,  DELORIS A 05/13/17 10:56          MACDONALD, RN,  MELANIE W 05/06/17 14:20          LLOYD,  DELORIS A 05/08/17 12:55          LLOYD,  DELORIS A 05/08/17 13:02          LLOYD,  DELORIS A 05/12/17 12:16          MALHI-BEALL,  SUHA 05/13/17 15:48      Unfinalized History                                                                                     Date/Time            Username    Reason for Unfinalizing         Freetext Reason for Unfinalizing                                          05/06/17 22:37       LLOYDD      Charging          05/08/17 12:54       LLOYDD      Charging          05/08/17 13:01       LLOYDD      Charging          05/12/17 12:12  LLOYDD      Charging          05/13/17 10:50       LLOYDD      Charging          05/13/17 15:47       MALHIS      Charging

## 2017-05-07 NOTE — Progress Notes (Signed)
SLP Time Spent With Patient - Text       SLP Time Spent With Patient Entered On:  05/07/2017 9:21 EDT    Performed On:  05/07/2017 9:21 EDT by Luisa HartPatrick, SLP, [place] R               Time Spent With Patient   SLP Dysphagia Eval Time :   2 minutes   SLP Total Individual Therapy Time :   2 minutes   SLP Treatment Time Comment :   Communicated with RN Katie who reports pt is having no difficulty with regular diet or PO meds. Full dysphagia eval not necessary at this time.    SLP Total Tx Time :   2 minutes   Luisa HartPatrick, SLP, [place] R - 05/07/2017 9:21 EDT

## 2017-05-07 NOTE — Case Communication (Signed)
CM Discharge Planning Assessment - Text       CM Discharge Planning Ongoing Assessment Entered On:  05/07/2017 14:54 EDT    Performed On:  05/07/2017 11:00 EDT by Donnetta SimpersBrown,  James E               Discharge Needs I   Previously Documented Discharge Needs :   DISCHARGE PLAN/NEEDS:  EQUIPMENT/TREATMENT NEEDS:       Previously Documented Benefits Information :   No discharge data available.     Anticipated Discharge Date :   05/07/2017 EDT   Discharge To :   Home with family support   CM Progress Note :   05/07/17/JEB- Pt admitted post op Anterior cervical discectomy and fusion at C3-4, partial vertebrectomies C3, C4, peek cage interbody graft filled with allograft and autograft bone C3-4, plating at C3-4, the use of the operating microscope and lateral C-arm fluoroscopy. Pt has d/c orders for home. No needs noted. IM signed 9/25.       Donnetta SimpersBrown,  James E - 05/07/2017 14:53 EDT   Discharge Needs II   Professional Skilled Services :   No Needs   Needs Assistance with Transportation :   No   Discharge Transportation Arranged :   No   Needs Assistance at Home Upon Discharge :   No   Requires Caregiver Involvement :   No   Discharge Planning Time Spent :   15 minutes   Donnetta SimpersBrown,  James E - 05/07/2017 14:53 EDT

## 2017-05-07 NOTE — Discharge Summary (Signed)
 Inpatient Clinical Summary             [facility]  Post-Acute Care Transfer Instructions  PERSON INFORMATION   Name: Kurt Mosley, Kurt Mosley  MRN: [id number]    FIN#: [id number]   PHYSICIANS  Admitting Physician: TARAS ZACHARY DEL  Attending Physician: TARAS ZACHARY DEL   PCP: COLONEL FAIRY LOVING  Discharge Diagnosis:  Cervical spinal stenosis  Comment:       PATIENT EDUCATION INFORMATION  Instructions:             CONSTIPATION (Adult); Acetaminophen; Oxycodone tablets; NS Dr Ric ACDF (Custom)  Medication Leaflets:               Follow-up:                            With: Address: When:   [facility] [address], [address] [place], [place] [address]  [phone] Business (1)    Comments:   as scheduled          With: Address: When:   [facility] [phone], [place] [address]  [phone] Business (1)                              MEDICATION LIST  Medication Reconciliation at Discharge:           New Medications  Other Medications  acetaminophen-oxyCODONE (oxyCODONE-acetaminophen 5 mg-325 mg oral tablet) 2 Tabs Oral (given by mouth) every 4 hours as needed moderate pain (4-7).  Patient Instructions: May take 1 tablet every 4 hours as needed for pain  Last Dose:____________________  cyclobenzaprine (cyclobenzaprine 10 mg oral tablet) 1 Tabs Oral (given by mouth) 3 times a day as needed other (see comment).  Last Dose:____________________  Medications that have not changed  Other Medications  amLODIPine (amLODIPine 5 mg oral tablet) 1 Tabs Oral (given by mouth) every day.  Last Dose:____________________  DULoxetine (DULoxetine 60 mg oral delayed release capsule) 1 Capsules Oral (given by mouth) every day.  Last Dose:____________________  glycopyrrolate (glycopyrrolate 1 mg oral tablet) 1 Tabs Oral (given by mouth) 3 times a day.  Last Dose:____________________  losartan (losartan 50 mg oral tablet) 1 Tabs Oral (given by mouth) every day.  Last  Dose:____________________  metoprolol (Metoprolol Succinate ER 50 mg oral tablet, extended release) 1 Tabs Oral (given by mouth) every day.  Last Dose:____________________  rosuvastatin (Crestor 20 mg oral tablet) 1 Tabs Oral (given by mouth) every day.  Last Dose:____________________  sitaGLIPtin-metFORMIN (Janumet 50 mg-1000 mg oral tablet) 1 Tabs Oral (given by mouth) 2 times a day.  Last Dose:____________________  solifenacin (VESIcare 10 mg oral tablet) 1 Tabs Oral (given by mouth) every day.  Last Dose:____________________  tamsulosin (tamsulosin 0.4 mg oral capsule) 1 Capsules Oral (given by mouth) every day.  Last Dose:____________________  No Longer Take the Following Medications  HYDROcodone-acetaminophen (HYDROcodone-acetaminophen 10 mg-325 mg range dose) 1 Tabs Oral (given by mouth) every 6 hours as needed.  Stop Taking Reason: Physician Request         Patient's Final Home Medication List Upon Discharge:            acetaminophen-oxyCODONE (oxyCODONE-acetaminophen 5 mg-325 mg oral tablet) 2 Tabs Oral (given by mouth) every 4 hours as needed moderate pain (4-7).  amLODIPine (amLODIPine 5 mg oral tablet) 1 Tabs Oral (given by mouth) every  day.  cyclobenzaprine (cyclobenzaprine 10 mg oral tablet) 1 Tabs Oral (given by mouth) 3 times a day as needed other (see comment).  DULoxetine (DULoxetine 60 mg oral delayed release capsule) 1 Capsules Oral (given by mouth) every day.  glycopyrrolate (glycopyrrolate 1 mg oral tablet) 1 Tabs Oral (given by mouth) 3 times a day.  losartan (losartan 50 mg oral tablet) 1 Tabs Oral (given by mouth) every day.  metoprolol (Metoprolol Succinate ER 50 mg oral tablet, extended release) 1 Tabs Oral (given by mouth) every day.  rosuvastatin (Crestor 20 mg oral tablet) 1 Tabs Oral (given by mouth) every day.  sitaGLIPtin-metFORMIN (Janumet 50 mg-1000 mg oral tablet) 1 Tabs Oral (given by mouth) 2 times a day.  solifenacin (VESIcare 10 mg oral tablet) 1 Tabs Oral (given by mouth)  every day.  tamsulosin (tamsulosin 0.4 mg oral capsule) 1 Capsules Oral (given by mouth) every day.         Comment:       ORDERS           Order Name Order Details   Discharge Patient 05/07/17 8:36:00 EDT, Discharge Home/Self Care

## 2020-06-19 ENCOUNTER — Encounter: Payer: Self-pay | Admitting: Psychiatry

## 2020-06-19 ENCOUNTER — Ambulatory Visit (INDEPENDENT_AMBULATORY_CARE_PROVIDER_SITE_OTHER): Payer: BC Managed Care – PPO | Admitting: Psychiatry

## 2020-06-19 VITALS — BP 130/82 | HR 74 | Ht 74.0 in | Wt 187.0 lb

## 2020-06-19 DIAGNOSIS — G4752 REM sleep behavior disorder: Secondary | ICD-10-CM

## 2020-06-19 DIAGNOSIS — G479 Sleep disorder, unspecified: Secondary | ICD-10-CM

## 2020-06-19 DIAGNOSIS — G478 Other sleep disorders: Secondary | ICD-10-CM

## 2020-06-19 DIAGNOSIS — G4733 Obstructive sleep apnea (adult) (pediatric): Secondary | ICD-10-CM

## 2020-06-19 DIAGNOSIS — G4700 Insomnia, unspecified: Secondary | ICD-10-CM

## 2020-06-19 DIAGNOSIS — R0683 Snoring: Secondary | ICD-10-CM

## 2020-06-19 MED ORDER — TRAZODONE HCL 100 MG OR TABS
100.0000 mg | ORAL_TABLET | Freq: Every evening | ORAL | Status: DC
Start: ? — End: 2020-08-21

## 2020-06-19 MED ORDER — OMEGA-3-ACID ETHYL ESTERS 1 GM OR CAPS
2.0000 g | ORAL_CAPSULE | Freq: Two times a day (BID) | ORAL | Status: DC
Start: ? — End: 2022-02-11

## 2020-06-19 MED ORDER — GABAPENTIN 800 MG OR TABS
800.0000 mg | ORAL_TABLET | Freq: Three times a day (TID) | ORAL | Status: DC
Start: ? — End: 2021-03-19

## 2020-06-19 MED ORDER — BUPROPION HCL 100 MG OR TABS: 100.0000 mg | ORAL_TABLET | Freq: Every day | ORAL | Status: AC

## 2020-06-19 MED ORDER — ROSUVASTATIN CALCIUM 40 MG OR TABS: 20.0000 mg | ORAL_TABLET | Freq: Every day | ORAL | Status: AC

## 2020-06-19 MED ORDER — AMLODIPINE 10 MG OR TABS
10.0000 mg | ORAL_TABLET | Freq: Every day | ORAL | Status: DC
Start: ? — End: 2021-05-10

## 2020-06-19 MED ORDER — LOSARTAN POTASSIUM 25 MG OR TABS
25.0000 mg | ORAL_TABLET | Freq: Every day | ORAL | Status: DC
Start: ? — End: 2021-05-10

## 2020-06-19 MED ORDER — VITAMIN D3 125 MCG (5000 UT) PO CAPS: 1.0000 | ORAL_CAPSULE | ORAL | Status: AC

## 2020-06-19 NOTE — Progress Notes (Signed)
Chief complaint (Questions to Be Answered):  dream enactment, frequently happening and leading into injuries      History of Presenting Illness:  Hunter Shaw is being seen in the sleep clinic requested for further evaluation of the above symptoms noted in the chief complaint section.      a. [name] of sleep symptoms: acting out dreaminig  Onset: 2 years ago  Frequency:  Now every night, getting worse  Alleviated by:   trazodone    In August 2021, went through a PSG in [place]. OSA was diagnosed and the report made no comments about REM behavior.       b. Sleep schedule:  Bed time: 2300  Sleep latency: 120 min  Number of awakening: 3  Final awakening time: 0700  Estimate sleep time: 5 h  Nap: x2 per week    c. Sleeping Environment  Medication/food at bed time: trazodone 100mg  hs since June 2021  Dark, quiet, cool bed room? yes  Sleep partner issue: no  Sleeping position: left side      d. Sleep Quality  Urge to move your legs? no  Snoring? yes  Gasping/snorting? yes  Witnessed apneas? no  Headache/heartburn/pain? yes  Sleep hallucination? yes  Sleep paralysis? yes  Dream quality: nightmares  Sleep behavior (sleepwalking, sleep eating, sleep terror, acting out dream): acting out dream  Nocturia? yes    e. Daytime Sleepiness and Sleep Related Problems  Regular daytime activity? (work schedule) July 2021  Excessive daytime sleepiness? yes  Caffeine use?  7-up (latest time of caffeine use:bed time)  Stimulant med? (name, dosing time, for what?) no  Sleep attacks (ever fallen asleep in potentially dangerous situations?)     no    f. comorbid conditions  Depression? no  Anxiety? yes  T&A? no  UPPP? no  Supplemental oxygen? no  COPD/asthma? no  Head injury? no  Seizure? no    g. family sleep issue: no. Mother was diagnosed with LB dementia.  Insomnia:  OSA:  Narcolepsy:  RLS:  Sleepwalking:    Epworth Sleep Assessment Questionnaire 06/19/2020   Sitting and Reading 2   Watching TV 2   Sitting inactive in a public place  (mtg, church, theater,etc) 2   As a passenger in a car for an hour without a break 1   Lying down to rest in the afternoon when the circumstances permit 2   Sitting and talking to someone 0   Sitting quietly after lunch without alcohol 1   In a car, while stopped for a few minutes in traffic 0   Epworth Total Score 10       FOSQ Score: 14     No past medical history on file.     MEDS:  Current Outpatient Medications on File Prior to Visit   Medication Sig Dispense Refill   . amLODIPINE (NORVASC) 10 MG tablet Take 10 mg by mouth daily.     13/03/2020 buPROPion (WELLBUTRIN) 100 MG tablet Take 100 mg by mouth daily.     . Cholecalciferol (VITAMIN D3) 125 MCG (5000 UT) capsule Take 1 capsule by mouth.     . gabapentin (NEURONTIN) 800 MG tablet Take 800 mg by mouth 3 times daily.     Marland Kitchen losartan (COZAAR) 25 MG tablet Take 25 mg by mouth daily.     Marland Kitchen omega-3 acid ethyl esters (LOVAZA) 1 GM capsule Take 2 g by mouth 2 times daily.     . rosuvastatin (CRESTOR) 40 MG tablet Take 20 mg  by mouth daily.     . traZODone (DESYREL) 100 MG tablet Take 100 mg by mouth nightly.       No current facility-administered medications on file prior to visit.     ALL:  Patient has no known allergies.    Family History:  No family history on file.    Social History     Tobacco Use   . Smoking status: Never Smoker   . Smokeless tobacco: Never Used   Substance Use Topics   . Alcohol use: Not on file   . Drug use: Not on file         Review of Systems:  10/14 systems reviewed, all negative except where mentioned in HPI or below:  Review of Systems       Physical Examination  Vitals:    06/19/20 0827   BP: 130/82   BP Location: Left arm   BP Patient Position: Sitting   BP cuff size: Regular   Pulse: 74   SpO2: 98%   Weight: 84.8 kg (187 lb)   Height: 6\' 2"  (1.88 m)   Estimated body mass index is 24.01 kg/m as calculated from the following:    Height as of this encounter: 6\' 2"  (1.88 m).    Weight as of this encounter: 84.8 kg (187 lb).    General:  Alert,  cooperative, no distress, appears stated age.  Head:  Normocephalic, without obvious abnormality, atraumatic.  Eyes:  Conjunctivae/corneas clear.   Nose: Nares normal.    Throat:  Mallampati: 4.  Neck: Supple, symmetrical, trachea midline, neck circumference was 15 inches.  Lungs:  Clear to auscultation bilaterally.  Heart:  Regular rate and rhythm, S1, S2 normal, no murmur, click, rub or gallop.   Extremities:  Extremities normal, atraumatic, no cyanosis or edema.  Pulses:  2+ and symmetric all extremities.  Neuropsychiatric exams   Mental status:  Speech and language were normal.  Attention span and concentration were normal.      Results/Records Reviewed: outside PSG reviewed in detail.      Impression:  Hunter Shaw is a 59 year old male with 2 year history of acting out dreaming, sleep paralysis, hallucinations with recent diagnosis of obstructive sleep apnea .     We will need a PAP titration PSG to differentiate RBD vs pseudo-RBD after optimizing the treatment of obstructive sleep apnea.      Problem List     ICD-10-CM ICD-9-CM    1. OSA (obstructive sleep apnea)  G47.33 327.23 Overnight Polysomnography   2. Snoring  R06.83 786.09    3. Dream enactment behavior  G47.52 327.42 Overnight Polysomnography   4. Sleep paralysis  G47.8 780.58    5. Sleep disturbance  G47.9 780.50    6. Insomnia, unspecified type  G47.00 780.52    7. RBD (REM behavioral disorder)  G47.52 327.42 Overnight Polysomnography         Plan:  1.  Ensure safe sleep environment.   2.  PAP titration polysomnogram to optimized PAP treatment and to evaluate for REM sleep behavior disorder (RBD).    3.  Discussed the risk of untreated OSA including vascular complications and driving while fatigued.   4.  Nutrition and weight management.  5.  Avoid unnecessary CNS stimulants or depressants.  6.  Discussed dangers of untreated EDS and of driving while fatigued.  7.  He may continue trazodone if that helps with acting out dream episodes.  8.  FU with  me via  zoom after the sleep study.     Staff Attestation  I have personally provided 60 minutes of clinical care time. Time includes review of sleep questionnaire, review of outside PSG, clarification of symptoms, discussion of differential diagnosis, necessary work-ups, contingency plans, communication to arrange next steps and documentation of the above.         Staff Involved: Attending Physician Only  Earma Reading, MD, MS  Attending Physician  Sleep Medicine

## 2020-06-19 NOTE — Patient Instructions (Signed)
1. We will arrange an in-lab sleep study (polysomnography) for treatment of obstructive sleep apnea and assessment for possible REM sleep behavior disorder (RBD)  2. Follow up zoom visit with me to discuss the result of the study. 3. Ensure safe environment.

## 2020-08-07 ENCOUNTER — Ambulatory Visit (INDEPENDENT_AMBULATORY_CARE_PROVIDER_SITE_OTHER): Payer: BC Managed Care – PPO | Admitting: Psychiatry

## 2020-08-07 VITALS — BP 134/82 | HR 60 | Temp 99.7°F | Resp 12 | Ht 74.0 in | Wt 186.9 lb

## 2020-08-07 DIAGNOSIS — G4752 REM sleep behavior disorder: Secondary | ICD-10-CM

## 2020-08-07 DIAGNOSIS — G4733 Obstructive sleep apnea (adult) (pediatric): Secondary | ICD-10-CM

## 2020-08-08 NOTE — Progress Notes (Signed)
TECH NOTE    VITALS  Blood pressure (BP): (!) 156/92  Temperature: 99.7 F (37.6 C)  Temperature source: Oral  Heart Rate: 87  Respirations: 17  Height: 6\' 2"  (188 cm)  Weight: 84.8 kg (186 lb 15.2 oz)  BMI (Calculated): 24  SpO2: 97 %  Neck circumference (in): 15 in  Waist (cm): 127 cm  Hip (cm): 126 cm  Waist/Hip: 1.01   Referred by: B     Patient is here for a Titration. Patient arrived to the [facility] on time with patient.      Incomplete study: No      Pre-study education: Yes  Translator needed: No Translator ID/Reference #: N/A    On-call provider contacted: No  Name:   On-call provider verbal instructions: N/A    Patient was discharged from [facility] on time with patient.  Patient and/or caregiver was alert, oriented and awake upon discharge.     Corday Wyka Muroni [facility], RCP, RPSGT

## 2020-08-21 ENCOUNTER — Telehealth: Payer: BC Managed Care – PPO | Admitting: Psychiatry

## 2020-08-21 DIAGNOSIS — G4752 REM sleep behavior disorder: Secondary | ICD-10-CM

## 2020-08-21 DIAGNOSIS — G4733 Obstructive sleep apnea (adult) (pediatric): Secondary | ICD-10-CM

## 2020-08-21 DIAGNOSIS — G4700 Insomnia, unspecified: Secondary | ICD-10-CM

## 2020-08-21 DIAGNOSIS — G479 Sleep disorder, unspecified: Secondary | ICD-10-CM

## 2020-08-21 MED ORDER — CLONAZEPAM 1 MG OR TABS
1.0000 mg | ORAL_TABLET | Freq: Every evening | ORAL | 3 refills | Status: DC
Start: 2020-08-21 — End: 2020-09-01

## 2020-08-21 NOTE — Patient Instructions (Signed)
1.  start clonazepam 0.5-1 mg at bed time.  Stop trazodone.  2. Start the PAP therapy as soon as the DME arranges it for you.   3. Ensure safe sleep environment.   4. Follow up in 10 weeks.

## 2020-08-21 NOTE — Progress Notes (Signed)
Chief complaint (Questions to Be Answered): treatment of obstructive sleep apnea and REM sleep behavior disorder (RBD)     History of Presenting Illness:  Hunter Shaw is being seen in the sleep clinic requested for further evaluation of the above symptoms noted in the chief complaint section.      a. Nature of sleep symptoms:   Over interval, PSG was 08/07/2020 with PAP titration and confirmation of RBD.   The patient had tried 10mg  melatonin on his own and did not see benefit in dream actions and thus wants to start clonazepam.   DME contacted him and told him that PAP machine is backordered.          No past medical history on file.     MEDS:  Current Outpatient Medications on File Prior to Visit   Medication Sig Dispense Refill   . amLODIPINE (NORVASC) 10 MG tablet Take 10 mg by mouth daily.     buPROPion (WELLBUTRIN) 100 MG tablet Take 100 mg by mouth daily.     . Cholecalciferol (VITAMIN D3) 125 MCG (5000 UT) capsule Take 1 capsule by mouth.     . gabapentin (NEURONTIN) 800 MG tablet Take 800 mg by mouth 3 times daily.     Marland Kitchen losartan (COZAAR) 25 MG tablet Take 25 mg by mouth daily.     Marland Kitchen omega-3 acid ethyl esters (LOVAZA) 1 GM capsule Take 2 g by mouth 2 times daily.     . rosuvastatin (CRESTOR) 40 MG tablet Take 20 mg by mouth daily.     . traZODone (DESYREL) 100 MG tablet Take 100 mg by mouth nightly.       No current facility-administered medications on file prior to visit.     ALL:  Patient has no known allergies.    Family History:  No family history on file.    Social History     Tobacco Use   . Smoking status: Never Smoker   . Smokeless tobacco: Never Used   Substance Use Topics   . Alcohol use: Not on file   . Drug use: Not on file         Review of Systems:  10/14 systems reviewed, all negative except where mentioned in HPI or below:  Review of Systems       Physical Examination  There were no vitals filed for this visit.Estimated body mass index is 24 kg/m as calculated from the following:     Height as of 08/07/20: 6\' 2"  (1.88 m).    Weight as of 08/07/20: 84.8 kg (186 lb 15.2 oz).    General:  Appearance is consistent with stated age and good nutritional status.  There are no apparent signs of acute distress.   Head and Face:  No head or facial deformity, no facial weakness or involuntary movement.   Neurologic:  Alert and oriented.    Psychological/MSE:  Appropriate dress and hygiene, alert, attentive, comfortable eye contact, easily engaged in conversation at a moderate pace, word choice and articulation are effortless and appropriate, mood is appropriate, affect is full, normal intensity and mood congruent, orientated, recent and remote memory are intake, normal thought content.     Results/Records Reviewed: PSG reviewed in detail    Impression:  Hunter Shaw is a 60 year old male with OSA and newly diagnosed RBD.    #OSA : outside PSG on 03/23/2020 with AHI of 8.5/hr and O2 nadir of 89%.   - PAP titration on 08/07/2020 successful    #  RBD: per PSG 08/07/2020 with RWA and frequent acting out dream per pt.   Tried melatonin with no benefit.  - to start clonazepam.      ICD-10-CM ICD-9-CM    1. RBD (REM behavioral disorder)  G47.52 327.42 clonazePAM (KLONOPIN) 1 MG tablet   2. OSA (obstructive sleep apnea)  G47.33 327.23    3. Dream enactment behavior  G47.52 327.42    4. Sleep disturbances  G47.9 780.50    5. Insomnia, unspecified type  G47.00 780.52         Plan:  1.  start clonazepam 0.5-1 mg at bed time.  Rx next due in 4 months.   - Stop trazodone.  2. Start the PAP therapy as soon as the DME arranges it for you.   3. Ensure safe sleep environment.   4. Follow up in 10 weeks.     Patient instructions were given as the below:    Staff Attestation  I have personally provided 30 minutes of clinical care time. Time includes review of sleep questionnaire, review of the sleep study, clarification of symptoms, discussion of differential diagnosis, necessary work-ups, contingency plans, communication to  arrange next steps and documentation of the above.         Staff Involved: Attending Physician Only  Earma Reading, MD, MS  Attending Physician  Sleep Medicine

## 2020-08-23 ENCOUNTER — Encounter: Payer: Self-pay | Admitting: Psychiatry

## 2020-08-24 NOTE — Telephone Encounter (Signed)
Patient called and wants status on his cpap machine he said DME said they didn't get order in his name please contact patient.

## 2020-08-29 NOTE — Telephone Encounter (Signed)
My chart sent to patient with DME contact information.

## 2020-09-01 ENCOUNTER — Encounter: Payer: Self-pay | Admitting: Psychiatry

## 2020-09-01 DIAGNOSIS — G4752 REM sleep behavior disorder: Secondary | ICD-10-CM

## 2020-09-01 MED ORDER — CLONAZEPAM 2 MG OR TABS
2.0000 mg | ORAL_TABLET | Freq: Every evening | ORAL | 3 refills | Status: DC
Start: 2020-09-01 — End: 2020-12-13

## 2020-10-02 ENCOUNTER — Encounter: Payer: Self-pay | Admitting: Psychiatry

## 2020-10-12 ENCOUNTER — Encounter: Payer: Self-pay | Admitting: Psychiatry

## 2020-11-01 ENCOUNTER — Telehealth: Payer: BC Managed Care – PPO | Admitting: Psychiatry

## 2020-11-01 ENCOUNTER — Encounter: Payer: Self-pay | Admitting: Psychiatry

## 2020-11-01 DIAGNOSIS — R0981 Nasal congestion: Secondary | ICD-10-CM

## 2020-11-01 DIAGNOSIS — Z7189 Other specified counseling: Secondary | ICD-10-CM

## 2020-11-01 DIAGNOSIS — G4733 Obstructive sleep apnea (adult) (pediatric): Secondary | ICD-10-CM

## 2020-11-01 DIAGNOSIS — G4752 REM sleep behavior disorder: Secondary | ICD-10-CM

## 2020-11-01 MED ORDER — FLUTICASONE PROPIONATE 50 MCG/ACT NA SUSP
1.0000 | Freq: Every day | NASAL | 3 refills | Status: AC
Start: 2020-11-01 — End: ?

## 2020-11-01 NOTE — Patient Instructions (Signed)
#.   He was reminded to wear his CPAP during all hours of sleep including during naps.  #. Continue clonazepam 2mg  and add melatonin 10 mg back.   #. Try sinus saline rinses to help with nasal congestion. A prescription has also been sent for Flonase as requested [facility])   #. Make sure you have a safe sleep surrounding - remove all sharp or potentially dangerous objects.   #. He was reminded that he should never drive if drowsy and should pull over at a safe place if he becomes drowsy while driving.  #. Follow up in clinic in 6 weeks with NP.  He was reminded to contact the clinic if he has any questions prior to the next visit.

## 2020-11-01 NOTE — Progress Notes (Signed)
Due to COVID-19 pandemic and a federally declared state of public health emergency, this service is being conducted via video.     The patient confirmed to be in the [place] presently.   The patient gave consent to proceed with the video/telephone visit today     ~~~  I had the pleasure of seeing Hunter Shaw, at the [facility] in virtual consultation.  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    DME: [facility]   Machine: AirSense 11 AutoSet   Mask: Nasal pillows     Interval History:  Since the last visit, [name] began using the PAP machine on 10/11/2020. He reports feeling more refreshed since starting PAP treatment. He occasionally feels nasal congestion and has tried switching mask sizes. He has noticed that when the straps are not tight enough there will be some leak but improves with tightening straps. He has been experiencing more nasal congestion in morning and evening. He is interested in trying Flonase again     He woke up last night in the middle of a dream and had almost fallen out of bed. He takes one clonazepam 2mg  before sleep.    Sleep schedule was reviewed:   He retires to bed at 11 pm and falls asleep within 20 min.  He awakens for the day at 7-9PM and does feel refreshed. He does take naps during the day but was unsure if he should be using the PAP machine. He has 2 awakenings per night.     Past medical history:  He  has no past medical history on file.    Past surgical history:  He  has no past surgical history on file.     Social history:  He  reports that he has never smoked. He has never used smokeless tobacco.     Family history:  His  family history is not on file.     Medications:  He  has a current medication list which includes the following prescription(s): amlodipine, bupropion, vitamin d3, clonazepam, gabapentin, losartan, omega-3 acid ethyl esters, and rosuvastatin.       Allergies:  He has No Known Allergies.     Review of Systems  No recent weight changes  Eyes: No blurred vision or double vision  Ears: No tinnitus or hearing loss  Nasal: No chronic congestion, runny nose, post nasal drip, seasonal allergies  Respiratory: No chronic cough, wheezing, shortness of breath with activity  Cardiac: No chest pain, palpitations, leg edema, orthopnea, PND  Neuro: No history of stroke, focal weakness, sensory deficit, syncope, seizures, neuropathy. +memory impairment   Endocrine: No excessive eating or drinking, no hair or skin changes  Musculoskeletal: No joint swelling or pain, no fibromyalgia  GI: No heartburn, diarrhea, nausea, chronic abdominal pain. +constipation   GU: No dysuria or polyuria. +erectile dysfunction  Skin: No itching or rash    Physical Exam:  There were no vitals taken for this visit. There is no height or weight on file to calculate BMI.  General:  Alert, cooperative, no distress, appears stated age  Mental status:  Speech and language were normal.  Attention span and concentration were normal.     Data Reviewed during this visit:  Download:  Type of PAP  Type of PAP:: Auto PAP (AirSense 11 AutoSet )  Pressure  PAP pressure: 95%:10.7 (7-12)  DATE  Start Date: 10/09/20  Download period  Number of days: 30  Percentage greater than 4 hours  % > 4 Hrs: 87  Average number of hrs per night when using  Average hours: 6 hours 25 minutes  AHI  Residual: 1.7  CPAP Compliance  Days Used: 20 Days  Total Days in Possession: 30 Days  Percentage of Days Used: 66.67 %  Leaks: 18.5 liters/hour    Other Relevant Data:      IMPRESSION:   Azel Gumina is a very pleasant 60 year old male with T2DM and HTN here for follow up OSA and RBD. His baseline AHI was 8.5/hr and O2 nadir 89%.He is compliant with PAP treatment and benefiting from therapy as evidenced by recent AHI 1.7/hr. He also has diagnosed RBD managed with clonazepam.     #. Obstructive Sleep Apnea (AHI on most recent Sleep  Study: 8.5/hr): improvement in AHI to 1.7/hr on PAP treatment. Subjective improvement in sleep quality endorsed     #. RBD: decrease in behaviors reported with clonazepam but still has breakthroughs       ICD-10-CM ICD-9-CM    1. OSA (obstructive sleep apnea)  G47.33 327.23    2. RBD (REM behavioral disorder)  G47.52 327.42 Consult/Referral to Neurology   3. CPAP use counseling  Z71.89 V65.49    4. Nasal congestion  R09.81 478.19 fluticasone propionate (FLONASE) 50 MCG/ACT nasal spray       PLAN:  #. He was reminded to wear his CPAP during all hours of sleep including during naps.  #. Continue clonazepam 2mg  and add melatonin 10 mg once nightly for RBD.   #. Try sinus saline rinses to help with nasal congestion. A prescription has also been sent for Flonase as requested [facility])   #. Make sure you have a safe sleep surrounding - remove all sharp or potentially dangerous objects.   #. He was reminded that he should never drive if drowsy and should pull over at a safe place if he becomes drowsy while driving.  #. Follow up in clinic in 6 weeks with [facility], NP.  He was reminded to contact the clinic if he has any questions prior to the next visit.     Staff involved in this encounter:   Alcide Goodness, NP and   Attending physician, Alcide Goodness, MD, MS       I have personally provided 30 minutes of clinical care time. Time includes review of sleep questionnaire, PAP data download, clarification of symptoms, discussion of differential diagnosis, necessary work-ups, contingency plans, communication to arrange next steps and documentation of the above.         Earma Reading, MD, MS  Attending Physician  Sleep Medicine    [facility]

## 2020-11-29 ENCOUNTER — Encounter: Payer: Self-pay | Admitting: Psychiatry

## 2020-12-07 ENCOUNTER — Encounter: Payer: Self-pay | Admitting: Psychiatry

## 2020-12-11 NOTE — Telephone Encounter (Signed)
Please send my note below to the patient's pain medicine doctor as indicated by the patient:  ------------------------------------------------  Re: Hunter Shaw; 28-Oct-1960    Per Hunter Shaw's request, I won't be renewing Rx of clonazepam any longer. However, I warned Hunter Shaw the danger of not taking clonazepam and not taking a good care of REM sleep behavior disorder.   Hunter Shaw stopping clonazepam is against my medical advice. But as he decided not to take clonazepam with his informed decision, I verify that I won't prescribe clonazepam to Hunter Shaw any longer.     Sincerely,    Minda Ditto, MD

## 2020-12-12 NOTE — Telephone Encounter (Signed)
I have talked with Dr. Wilfred Curtis over the phone. Given safety concerns for not taking clonazepam and worsening RBD symptoms, Dr. Wilfred Curtis plans to talk with Hunter Shaw again about safe use of pain medication while continuing clonazepam.   Thus I will be continuing clonazepam for Hunter Shaw as a treatment of REM sleep behavior disorder (RBD).

## 2020-12-13 ENCOUNTER — Telehealth: Payer: BC Managed Care – PPO | Admitting: Psychiatry

## 2020-12-13 ENCOUNTER — Encounter: Payer: Self-pay | Admitting: Psychiatry

## 2020-12-13 DIAGNOSIS — G4752 REM sleep behavior disorder: Secondary | ICD-10-CM

## 2020-12-13 DIAGNOSIS — Z7189 Other specified counseling: Secondary | ICD-10-CM

## 2020-12-13 DIAGNOSIS — G4733 Obstructive sleep apnea (adult) (pediatric): Secondary | ICD-10-CM

## 2020-12-13 MED ORDER — CLONAZEPAM 1 MG OR TABS
1.0000 mg | ORAL_TABLET | Freq: Every evening | ORAL | 3 refills | Status: DC
Start: 2020-12-13 — End: 2021-04-27

## 2020-12-13 NOTE — Progress Notes (Signed)
Due to COVID-19 pandemic and a federally declared state of public health emergency, this service is being conducted via video.     The patient confirmed to be in the [place] presently.   The patient gave consent to proceed with the video/telephone visit today     ~~~  I had the pleasure of seeing Hunter Shaw, at the [facility] in virtual consultation.  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    DME: [facility]   Machine: AirSense 11 AutoSet   Mask: Nasal pillows     Interval History:  Today we discussed pt's usage of pain medications for chronic back pain, specifically percocet 10/325. Pt reports he had surgery in April for back surgery and notes he is in significant pain that is impacting his sleep. Pt has not been taking klonopin since his surgery due to concerns from his pain specialist regarding usage of opiates and klonopin. Discussed with pain specialist importance of treatment of RBD and that treatment of choice is klonopin. Jointly agreed to continue klonopin and for pain specialist to have further discussion with pt regarding safe use of opiates following surgery.    Hunter Shaw began using the PAP machine on 10/11/2020. He reports good compliance and good fit of his mask. Sleep has been worse due to his back surgery. He retires to bed at 10-11 pm and falls asleep within 5-30 min. Reports worsening of his sleep pattern after his back surgery.  He sometimes wakes up at 3am. Takes 30 minutes to fall asleep. Usually wakes up at 6-7am. Sometimes feels refreshed in sleep. Not using CPAP machine when taking a nap (>1 hour per nap).    Past medical history:  He  has no past medical history on file.    Past surgical history:  He  has no past surgical history on file.     Social history:  He  reports that he has never smoked. He has never used smokeless tobacco.     Family history:  His  family history is  not on file.     Medications:  He  has a current medication list which includes the following prescription(s): amlodipine, bupropion, vitamin d3, clonazepam, fluticasone propionate, gabapentin, losartan, omega-3 acid ethyl esters, and rosuvastatin.      Allergies:  He has No Known Allergies.     Review of Systems  No recent weight changes  Eyes: No blurred vision or double vision  Ears: No tinnitus or hearing loss  Nasal: No chronic congestion, runny nose, post nasal drip, seasonal allergies  Respiratory: No chronic cough, wheezing, shortness of breath with activity  Cardiac: No chest pain, palpitations, leg edema, orthopnea, PND  Neuro: No history of stroke, focal weakness, sensory deficit, syncope, seizures, neuropathy. +memory impairment   Endocrine: No excessive eating or drinking, no hair or skin changes  Musculoskeletal: No joint swelling or pain, no fibromyalgia  GI: No heartburn, diarrhea, nausea, chronic abdominal pain. +constipation   GU: No dysuria or polyuria. +erectile dysfunction  Skin: No itching or rash    Physical Exam:  There were no vitals taken for this visit. There is no height or weight on file to calculate BMI.  General:  Alert, cooperative, no distress, appears stated age  Mental status:  Speech and language were normal.  Attention span and concentration were normal.     Data Reviewed during this visit:  Download:  Type of PAP  Type of PAP:: Auto PAP (AIRSENSE 11 AUTOSET)  Pressure  PAP pressure: MIN: 7, MAX: 12  DATE  Start Date: 10/09/20  Download period  Number of days: 30  Percentage greater than 4 hours  % > 4 Hrs: 80  Average number of hrs per night when using  Average hours: 6 HRS 42 MIN  AHI  Residual: 3.5  CPAP Compliance  Days Used: 26 Days  Total Days in Possession: 30 Days  Percentage of Days Used: 86.67 %  Leaks: 9.8 liters/hour      IMPRESSION:   Hunter Shaw is a very pleasant 60 year old male with T2DM and HTN here for follow up OSA and RBD. His baseline AHI was 8.5/hr and O2  nadir 89%.He is compliant with PAP treatment and benefiting from therapy as evidenced by recent AHI 3.5/hr. He also has diagnosed RBD managed with clonazepam.     #. Obstructive Sleep Apnea (AHI on most recent Sleep Study: 8.5/hr): improvement in AHI to 3.5/hr on PAP treatment. Subjective improvement in sleep quality endorsed     #. RBD: has not used klonopin this past month due to concerns of usage with opiate prescription. However discussed importance of proper treatment of RBD given risk of injury with pt and outside pain specialist. Jointly agreed to restart klonopin at lower dose and for outside pain specialist to have discussion with pt regarding safe use of opiates following back surgery.       ICD-10-CM ICD-9-CM    1. OSA (obstructive sleep apnea)  G47.33 327.23    2. RBD (REM behavioral disorder)  G47.52 327.42 clonazePAM (KLONOPIN) 1 MG tablet   3. 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 He was reminded to wear his CPAP during all hours of sleep including during naps.  #. Decrease clonazepam 2mg ->1mg  qHS and increase melatonin 10->20 mg qHS for RBD.   #. Make sure you have a safe sleep surrounding - remove all sharp or potentially dangerous objects.   #. He was reminded that he should never drive if drowsy and should pull over at a safe place if he becomes drowsy while driving.  #. Follow up in clinic in 6 months. He was reminded to contact the clinic if he has any questions prior to the next visit.     Staff involved in this encounter:   MD PGY2  Attending physician, Martina Sinner, MD, MS     I have personally provided 40 minutes of clinical care time. Time includes review of PAP machine data interrogation and review, contingency plan, discussion and documentation of the above plans.      Earma Reading, MD, MS  Attending Physician  Sleep Medicine    [facility]

## 2021-02-09 ENCOUNTER — Ambulatory Visit: Payer: BC Managed Care – PPO | Admitting: Vascular Neurology

## 2021-02-23 ENCOUNTER — Ambulatory Visit: Payer: BC Managed Care – PPO | Admitting: Vascular Neurology

## 2021-03-02 ENCOUNTER — Encounter: Payer: Self-pay | Admitting: Neurology

## 2021-03-12 ENCOUNTER — Ambulatory Visit: Payer: BC Managed Care – PPO | Admitting: Neurology

## 2021-03-15 ENCOUNTER — Encounter: Payer: Self-pay | Admitting: Neurology

## 2021-03-19 ENCOUNTER — Encounter: Payer: Self-pay | Admitting: Neurology

## 2021-03-19 ENCOUNTER — Ambulatory Visit: Payer: BC Managed Care – PPO | Admitting: Neurology

## 2021-03-19 VITALS — BP 109/85 | HR 73 | Temp 97.4°F | Ht 74.0 in | Wt 199.8 lb

## 2021-03-19 DIAGNOSIS — R251 Tremor, unspecified: Secondary | ICD-10-CM

## 2021-03-19 MED ORDER — LORATADINE 10 MG OR TABS: 10.00 mg | ORAL_TABLET | Freq: Every day | ORAL | Status: AC

## 2021-03-19 MED ORDER — MOVANTIK PO: ORAL | Status: AC

## 2021-03-19 MED ORDER — ALFUZOSIN HCL 10 MG OR TB24
10.0000 mg | ORAL_TABLET | Freq: Every day | ORAL | Status: DC
Start: ? — End: 2022-07-08

## 2021-03-19 MED ORDER — JATENZO 198 MG PO CAPS: ORAL_CAPSULE | ORAL | Status: AC

## 2021-03-19 MED ORDER — TIZANIDINE HCL 4 MG OR TABS
4.0000 mg | ORAL_TABLET | Freq: Four times a day (QID) | ORAL | Status: DC | PRN
Start: ? — End: 2022-07-08

## 2021-03-19 MED ORDER — DOCUSATE SODIUM 250 MG OR CAPS: 250.00 mg | ORAL_CAPSULE | Freq: Two times a day (BID) | ORAL | Status: AC

## 2021-03-19 MED ORDER — PREGABALIN 100 MG OR CAPS: 100.00 mg | ORAL_CAPSULE | Freq: Three times a day (TID) | ORAL | Status: AC

## 2021-03-19 MED ORDER — DILANTIN PO
ORAL | Status: DC
Start: ? — End: 2021-05-10

## 2021-03-19 NOTE — Progress Notes (Signed)
 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 He is accompanied by his fiance and is being evaluated for tremors. He has a history of REM sleep behavior disorder. He notes that his mother had Lewy body dementia (LBD) and he is concerned that his tremor may be a sign of LBD.    He noticed a tremor in his right hand while he was holding his phone about 4 months ago. He soon began to have a similar tremor in his left hand and he has noticed a tremor in his lip in the past week. He has no trouble doing things with his hands and notes no change in his handwriting. The tremor comes and goes and he knows nothing which makes it better or worse.  He has noticed his right hand shaking when he is sitting and relaxed and not using his hand.    Review of Systems   Constitutional: Positive for appetite change.   HENT: Positive for sneezing.    Respiratory: Positive for chest tightness and wheezing.    Gastrointestinal: Positive for constipation.   Genitourinary: Positive for difficulty urinating and frequency.   Musculoskeletal: Positive for arthralgias.   Skin: Negative.    Neurological: Positive for tremors and syncope.   Psychiatric/Behavioral: Positive for agitation and sleep disturbance.        Current Outpatient Medications   Medication Sig Dispense Refill   . alfuzosin (UROXATRAL) 10 MG Sustained-Release tablet Take 10 mg by mouth daily.     Marland Kitchen amLODIPINE (NORVASC) 10 MG tablet Take 10 mg by mouth daily.     Marland Kitchen buPROPion (WELLBUTRIN) 100 MG tablet Take 100 mg by mouth daily.     . Cholecalciferol (VITAMIN D3) 125 MCG (5000 UT) capsule Take 1 capsule by mouth.     . clonazePAM (KLONOPIN) 1 MG tablet Take 1 tablet (1 mg) by mouth at bedtime. 30 tablet 3   . docusate sodium (COLACE) 250 MG capsule Take 250 mg by mouth 2 times daily.     . fluticasone propionate (FLONASE) 50 MCG/ACT nasal spray Spray 1 spray into each nostril daily. 16 g 3   .  loratadine (CLARITIN) 10 MG tablet Take 10 mg by mouth daily.     Marland Kitchen losartan (COZAAR) 25 MG tablet Take 25 mg by mouth daily. 50 mg per pt     . Naloxegol Oxalate (MOVANTIK PO)      . omega-3 acid ethyl esters (LOVAZA) 1 GM capsule Take 2 g by mouth 2 times daily.     Marland Kitchen Phenytoin (DILANTIN PO) 4mg  4 times a day per pt     . pregabalin (LYRICA) 100 MG capsule Take 100 mg by mouth 3 times daily.     . rosuvastatin (CRESTOR) 40 MG tablet Take 20 mg by mouth daily. 20 mg per pt     . Testosterone Undecanoate (JATENZO) 198 MG CAPS BID per pt     . tiZANidine (ZANAFLEX) 4 MG tablet Take 4 mg by mouth every 6 hours as needed.       No current facility-administered medications for this visit.     Allergies   Allergen Reactions   . Atorvastatin Swelling and Unspecified       Reviewed patients pertinent information related to social history, past medical, past surgical, and family history.     Objective:  Vital signs: BP  109/85 (BP Location: Left arm, BP Patient Position: Sitting, BP cuff size: Regular)   Pulse 73   Temp 97.4 F (36.3 C) (Temporal)   Ht 6\' 2"  (1.88 m)   Wt 90.6 kg (199 lb 12.8 oz)   BMI 25.65 kg/m     Neurologic Exam     Mental Status   Speech: speech is normal   Level of consciousness: alert    No voice tremor     Cranial Nerves     CN II   Visual fields full to confrontation.     CN III, IV, VI   Extraocular motions are normal.     CN VIII   Hearing: intact    CN IX, X   Palate: symmetric    CN XI   Right trapezius strength: normal  Left trapezius strength: normal    CN XII   Tongue: not atrophic  Fasciculations: absent  Tongue deviation: none    Motor Exam   Muscle bulk: normal  Right arm pronator drift: absent  Left arm pronator drift: absent    Strength   Strength 5/5 throughout.     Sensory Exam   Light touch normal.     Gait, Coordination, and Reflexes     Tremor   Resting tremor: absent    Reflexes   Right brachioradialis: 2+  Left brachioradialis: 2+  Right biceps: 2+  Left biceps: 2+  Right  triceps: 2+  Left triceps: 2+  Right patellar: 1+  Left patellar: 1+  Right achilles: 1+  Left achilles: 1+  Decreased arm swing R, walks a little slowly      MDS-UPDRS 03/19/2021   3a Is the patient on medication for treating the symptoms of Parkinson's Disease? No   3c Is the patient on Levodopa? No   3.1 Speech 0   3.2 Facial Expression 0   3.3a Rigidity - Neck 0   3.3b Rigidity - RUE 0   3.3c Rigidity - LUE 0   3.3d Rigidity - RLE 1   3.3e Rigidity - LLE 1   3.4a Finger Tapping - Right Hand 1   3.4b Finger Tapping - Left Hand 0   3.5a Hand Movements - Right Hand 1   3.5b Hand Movements - Left Hand 0   3.6a Pronation-Supination Movements of Hands - Right Hand 1   3.6b Pronation-Supination Movements of Hands - Left Hand 0   3.7a Toe Tapping - Right Foot 0   3.7b Toe Tapping - Left Foot 0   3.8a Leg Agility - Right Leg 0   3.8b Leg Agility - Left Leg 0   3.9 Arising From Chair 0   3.10 Gait 1   3.11 Freezing of Gait 0   3.12 Postural Stability 0   3.13 Posture 0   3.14 Global Spontaneity of Movement (Body Bradykinesia) 1   3.15a Postural Tremor of the Hands - Right Hand 0   3.15b Postural Tremor of the Hands - Left Hand 0   3.16a Kinetic Tremor of the Hands - Right Hand 0   3.16b Kinetic Tremor of the Hands - Left Hand 0   3.17a Rest Tremor Amplitude - RUE 0   3.17b Rest Tremor Amplitude - LUE 0   3.17c Rest Tremor Amplitude - RLE 0   3.17d Rest Tremor Amplitude - LLE 0   3.17e Rest Tremor Amplitude - Lip/Jaw 0   3.18 Constancy of Rest Tremor 0   Total Part III 7     "  This EMR incorporates the complete MDS-Unified Parkinson Disease Rating Scale (MDS-UPDRS), which is owned by the [employer] and Movement Disorder Society (MDS). The Rating Scale is protected by U.S. Copyright law as software and an automated database."      Assessment/Plan:  Hunter Shaw was seen today for Tremor     Hunter Shaw has had a tremor for about 4 months. The tremor is intermittent and occurs with action and at rest. He has a history  of RBD and his mother had LBD and he is concerned that his tremor may be due to parkinsonism. On examination today I was not able to observe any tremor. His right hand movements were slightly slower than his left hand movements.    Hunter Mcbrien has a recent onset tremor which may be due to essential tremor (ET) or parkinsonism. There are some signs on exam which are suggestive of parkinsonism but I cannot make a definitive diagnosis at this time. I discussed my findings with him today. I recommend he get a DATSCAN and he agrees. He does not need any symptomatic treatment at this time. He will return via telehealth after the scan is completed.    [name] MD  Professor of Neurology     I spent 60 minutes on this patient today; >50% of the time was spent counseling the patient on tremors.      Diagnosed with:    ICD-10-CM ICD-9-CM    1. Tremor  R25.1 781.0 Nuc Med DaT Scan Brain Imaging     Orders Placed This Encounter   Procedures   . Nuc Med DaT Scan Brain Imaging

## 2021-03-19 NOTE — Patient Instructions (Addendum)
Please allow 7-14 business days for your insurance to approve the services and/or consultations ordered. The numbers provided here are for your reference; if you do not receive a call from the scheduling department, please feel free to contact them directly to set up an appointment.      Radiology Scheduling:   Phone: [phone]  Call to schedule your imaging study      To make or change an appointment:  Call Center: [phone]   Fax: [phone]      On behalf of Dr. Kellie Shropshire, thank you for your visit with [place] today here at [facility]. Your feedback is important to [place] and we strive to provide every patient with an exceptional patient care experience.      You may receive a patient satisfaction survey in the mail, via phone call or through your e-mail. We value your feedback and use it to help improve the experience for all our patients here at [facility]. Please feel free to share a positive experience you had at our facility or if you have suggestions about how we can improve our services.     Thank you for choosing [facility], we look forward to seeing you again!     Sincerely,     Lakeena Downie, LVN

## 2021-03-30 ENCOUNTER — Encounter: Payer: Self-pay | Admitting: Neurology

## 2021-04-02 ENCOUNTER — Ambulatory Visit: Payer: BC Managed Care – PPO | Admitting: Neurology

## 2021-04-12 ENCOUNTER — Encounter: Payer: Self-pay | Admitting: Neurology

## 2021-04-23 ENCOUNTER — Encounter: Payer: BC Managed Care – PPO | Admitting: Neurology

## 2021-04-26 ENCOUNTER — Other Ambulatory Visit: Payer: Self-pay | Admitting: Psychiatry

## 2021-04-26 DIAGNOSIS — G4752 REM sleep behavior disorder: Secondary | ICD-10-CM

## 2021-04-27 ENCOUNTER — Telehealth: Payer: Self-pay | Admitting: Psychiatry

## 2021-04-27 MED ORDER — CLONAZEPAM 1 MG OR TABS
ORAL_TABLET | ORAL | 3 refills | Status: DC
Start: 2021-04-27 — End: 2021-05-01

## 2021-04-27 NOTE — Telephone Encounter (Signed)
Left voice message for patient to call back to schedule a follow up with Doctor IM.

## 2021-04-28 ENCOUNTER — Encounter: Payer: Self-pay | Admitting: Psychiatry

## 2021-04-28 DIAGNOSIS — G4733 Obstructive sleep apnea (adult) (pediatric): Secondary | ICD-10-CM

## 2021-04-30 NOTE — Addendum Note (Signed)
Addended by: Lavona Mound on: 04/30/2021 04:01 PM     Modules accepted: Orders

## 2021-05-01 ENCOUNTER — Other Ambulatory Visit: Payer: Self-pay | Admitting: Psychiatry

## 2021-05-01 DIAGNOSIS — G4752 REM sleep behavior disorder: Secondary | ICD-10-CM

## 2021-05-03 MED ORDER — CLONAZEPAM 1 MG OR TABS
1.0000 mg | ORAL_TABLET | Freq: Every evening | ORAL | 3 refills | Status: DC
Start: 2021-05-03 — End: 2021-07-17

## 2021-05-07 ENCOUNTER — Ambulatory Visit
Admission: RE | Admit: 2021-05-07 | Discharge: 2021-05-07 | Disposition: A | Discharge status: Home / Self Care | Payer: BC Managed Care – PPO | Attending: Neurology | Admitting: Neurology

## 2021-05-07 ENCOUNTER — Encounter: Payer: Self-pay | Admitting: Neurology

## 2021-05-07 DIAGNOSIS — R251 Tremor, unspecified: Secondary | ICD-10-CM | POA: Insufficient documentation

## 2021-05-07 DIAGNOSIS — R9402 Abnormal brain scan: Secondary | ICD-10-CM | POA: Insufficient documentation

## 2021-05-07 MED ORDER — IOFLUPANE I 123 185 MBQ/2.5ML IV SOLN
5.5900 | Freq: Once | INTRAVENOUS | Status: AC
Start: 2021-05-07 — End: 2021-05-07
  Administered 2021-05-07 (×2): 5.59 via INTRAVENOUS

## 2021-05-08 ENCOUNTER — Telehealth: Payer: Self-pay | Admitting: Neurology

## 2021-05-08 NOTE — Telephone Encounter (Signed)
MyChart message sent to pt and Zoom Appt changed to this Thursday at 3:30pm. Rab,lvn

## 2021-05-08 NOTE — Telephone Encounter (Signed)
VM left for pt to c/b in regards to moving appt up sooner. Okay per Dr. Kellie Shropshire. Cb #: [phone]. Rab,lvn

## 2021-05-08 NOTE — Telephone Encounter (Signed)
Patient called back regarding MyChart message Rebekah sent on 05/08/21 called clinic advised Rebekah was at lunch.      please further assist, thank you

## 2021-05-10 ENCOUNTER — Telehealth: Payer: BC Managed Care – PPO | Admitting: Neurology

## 2021-05-10 ENCOUNTER — Encounter: Payer: Self-pay | Admitting: Neurology

## 2021-05-10 DIAGNOSIS — G20 Parkinson's disease: Secondary | ICD-10-CM

## 2021-05-10 NOTE — Progress Notes (Signed)
 Subjective:   Hunter Shaw is a 60 year old male   HPI    Mr Nunley returns via telehealth for f/u of tremors. He had a DATSCAN which showed decreased activity in the right putamen. He continues to have intermittent tremor in his lips and hands.

## 2021-05-14 ENCOUNTER — Encounter: Payer: BC Managed Care – PPO | Admitting: Neurology

## 2021-05-21 ENCOUNTER — Encounter: Payer: BC Managed Care – PPO | Admitting: Neurology

## 2021-07-10 ENCOUNTER — Ambulatory Visit: Payer: BC Managed Care – PPO | Admitting: Neurology

## 2021-07-17 ENCOUNTER — Telehealth: Payer: BC Managed Care – PPO | Admitting: Psychiatry

## 2021-07-17 DIAGNOSIS — G4752 REM sleep behavior disorder: Secondary | ICD-10-CM

## 2021-07-17 DIAGNOSIS — Z7189 Other specified counseling: Secondary | ICD-10-CM | POA: Insufficient documentation

## 2021-07-17 DIAGNOSIS — G4733 Obstructive sleep apnea (adult) (pediatric): Secondary | ICD-10-CM

## 2021-07-17 MED ORDER — CLONAZEPAM 1 MG OR TABS
1.0000 mg | ORAL_TABLET | Freq: Every evening | ORAL | 3 refills | Status: DC
Start: 2021-07-17 — End: 2021-11-12

## 2021-07-17 NOTE — Progress Notes (Signed)
Chief complaint: obstructive sleep apnea and REM sleep behavior disorder (RBD)    History of Present Illness:  I had the pleasure of seeing Hunter Shaw, at the [facility] in virtual clinic visit. 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     Over interval he was diagnosed with Parkinson disease.   Continues with Klonopin and melatonin for managing RBD, without side effects.     Uses APAP therapy well. Recently since started working in October, the adherence has not been the best.    BT: 2200. Takes clonazepam 1mg  and melatonin 20mg  at the same time  SOL: 10-15 min  WASO: 0-1  WT: 0730  No major acting out dreaming.     Nevertheless recently he started noticing some middle awakening and having difficulty falling back asleep.     DME: [facility]   Machine: AirSense 11 AutoSet   Mask: Nasal pillows        Past medical history:  He  has no past medical history on file.    Past surgical history:  He  has no past surgical history on file.     Social history:  He  reports that he has never smoked. He has never used smokeless tobacco.     Family history:  His  family history is not on file.     Medications:  He  has a current medication list which includes the following prescription(s): alfuzosin, bupropion, vitamin d3, clonazepam, docusate sodium, fluticasone propionate, loratadine, naloxegol oxalate, omega-3 acid ethyl esters, pregabalin, rosuvastatin, jatenzo, and tizanidine.      Allergies:  He is allergic to atorvastatin.      Physical Exam:  There were no vitals taken for this visit. There is no height or weight on file to calculate BMI.  General:  Alert, cooperative, no distress, appears stated age  Mental status:  Speech and language were normal.  Attention span and concentration were normal.     Data Reviewed during this visit:  Download:  Type of PAP  Type of PAP:: Auto PAP  Pressure  PAP pressure: 7-12  DATE  Start  Date: 06/16/21  Download period  Number of days: 30  Percentage greater than 4 hours  % > 4 Hrs: 60  Average number of hrs per night when using  Average hours: 6H 10M  AHI  Residual: 0.6  CPAP Compliance  Days Used: 21 Days  Total Days in Possession: 30 Days  Percentage of Days Used: 70 %  Leaks: 15.1 liters/hour      IMPRESSION:   Hunter Shaw is a very pleasant 60 year old male with T2DM and HTN here for follow up OSA and RBD.      #. Obstructive Sleep Apnea (AHI on most recent Sleep Study: 8.5/hr): uses APAP therapy at 7-12 cmH2O without problems.      #. RBD: on stable dose of klonopin 1mg  hs and melatonin 20mg  hs. No major dream enactment episode over interval.    Of note he was eventually diagnosed with Parkinson disease in September 2022.       ICD-10-CM ICD-9-CM    1. OSA (obstructive sleep apnea)  G47.33 327.23       2. RBD (REM behavioral disorder)  G47.52 327.42 clonazePAM (KLONOPIN) 1 MG tablet      3. 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 Continue the PAP therapy. Work closely with the DME to regularly update supplies.  #. Continue clonazepam 1 mg qHS for RBD.  #  Continue melatonin 20 mg qHS for RBD.   #. Make sure you have a safe sleep surrounding - remove all sharp or potentially dangerous objects.   #. He was reminded that he should never drive if drowsy and should pull over at a safe place if he becomes drowsy while driving.  #. Follow up with an NP every 4 months to monitor and renew Rx of clonazepam.        I have personally provided 30 minutes of clinical care time. Time includes review of PAP machine data interrogation and review, contingency plan, discussion and documentation of the above plans.      Raeford Razor, MD, MS  Attending Physician  Sleep Medicine    [facility]

## 2021-07-17 NOTE — Patient Instructions (Signed)
#.   Continue the PAP therapy. Work closely with the DME to regularly update supplies.  #. Continue clonazepam 1 mg qHS for RBD.  #  Continue melatonin 20 mg qHS for RBD.   #. Make sure you have a safe sleep surrounding - remove all sharp or potentially dangerous objects.   #. He was reminded that he should never drive if drowsy and should pull over at a safe place if he becomes drowsy while driving.  #. Follow up with an NP every 4 months to monitor and renew Rx of clonazepam.

## 2021-07-25 ENCOUNTER — Ambulatory Visit: Payer: BC Managed Care – PPO | Admitting: Neurology

## 2021-07-25 ENCOUNTER — Telehealth: Payer: Self-pay | Admitting: Psychiatry

## 2021-07-25 ENCOUNTER — Encounter: Payer: Self-pay | Admitting: Neurology

## 2021-07-25 ENCOUNTER — Ambulatory Visit: Payer: BC Managed Care – PPO | Attending: Neurology | Admitting: Neurology

## 2021-07-25 DIAGNOSIS — G20 Parkinson's disease: Secondary | ICD-10-CM | POA: Insufficient documentation

## 2021-07-25 DIAGNOSIS — R251 Tremor, unspecified: Secondary | ICD-10-CM | POA: Insufficient documentation

## 2021-07-25 MED ORDER — PRIMIDONE 50 MG OR TABS
100.0000 mg | ORAL_TABLET | Freq: Every evening | ORAL | 3 refills | Status: DC
Start: 2021-07-25 — End: 2021-11-02

## 2021-07-25 NOTE — Telephone Encounter (Signed)
Called to schedule a follow up appointment with Alcide Goodness IN 4 MONTHS (11/15/2021)

## 2021-07-25 NOTE — Progress Notes (Signed)
Due to COVID-19 pandemic and a federally declared state of public health emergency, this telemedicine visit was conducted Audio+Video. The patient was in [place] for the duration of the visit.  The patient consented to a virtual visit, understanding the risks, such as a limited examination, and was aware that an in-person visit was available to them. Total time spent was 11-20 minutes.            Subjective:   Hunter Shaw is a 60 year old male   HPI    Hunter Shaw returns via telehealth for f/u of tremors. He had an abnormal DATSCAN but his bothersome tremor is more of an action tremor. It bothers him when he holds something with or uses his right hand. He is able to type ok. His walking is ok. He is currently on no treatment.      Review of Systems     Current Outpatient Medications   Medication Sig Dispense Refill   . alfuzosin (UROXATRAL) 10 MG Sustained-Release tablet Take 10 mg by mouth daily.     Marland Kitchen buPROPion (WELLBUTRIN) 100 MG tablet Take 100 mg by mouth daily.     . Cholecalciferol (VITAMIN D3) 125 MCG (5000 UT) capsule Take 1 capsule by mouth.     . clonazePAM (KLONOPIN) 1 MG tablet Take 1 tablet (1 mg) by mouth nightly. 30 tablet 3   . docusate sodium (COLACE) 250 MG capsule Take 250 mg by mouth 2 times daily.     . fluticasone propionate (FLONASE) 50 MCG/ACT nasal spray Spray 1 spray into each nostril daily. 16 g 3   . loratadine (CLARITIN) 10 MG tablet Take 10 mg by mouth daily.     . Naloxegol Oxalate (MOVANTIK PO)      . omega-3 acid ethyl esters (LOVAZA) 1 GM capsule Take 2 g by mouth 2 times daily.     . pregabalin (LYRICA) 100 MG capsule Take 100 mg by mouth 3 times daily.     . primidone (MYSOLINE) 50 MG tablet Take 2 tablets (100 mg) by mouth at bedtime. 60 tablet 3   . rosuvastatin (CRESTOR) 40 MG tablet Take 20 mg by mouth daily. 20 mg per pt     . Testosterone Undecanoate (JATENZO) 198 MG CAPS BID per pt     . tiZANidine (ZANAFLEX) 4 MG tablet Take 4 mg by mouth every 6 hours as needed.        No current facility-administered medications for this visit.     Allergies   Allergen Reactions   . Atorvastatin Swelling and Unspecified       Reviewed patients pertinent information related to social history, past medical, past surgical, and family history.     Objective:  Vital signs: There were no vitals taken for this visit.    Physical Exam  Pulmonary:      Effort: Pulmonary effort is normal.   Neurological:      Mental Status: He is alert.      Cranial Nerves: No dysarthria or facial asymmetry.      Comments:   No voice tremor  No head tremor  No postural or action tremor observed  Finger tap 1+ R, normal L   Psychiatric:         Mood and Affect: Mood normal.         Behavior: Behavior normal.         Assessment/Plan:  Hunter Shaw was seen today for tremor.    Diagnoses and all orders for  this visit:    Tremor    Parkinsonism, unspecified Parkinsonism type (CMS-HCC)    Other orders  -     primidone (MYSOLINE) 50 MG tablet; Take 2 tablets (100 mg) by mouth at bedtime.    Hunter Shaw has a tremor with an abnormal DATSCAN. He is bothered only by his action tremor. I discussed treatment options with him today. He agrees to begin treatment with primidone and will titrate up to 50-100 mg nightly. He will return via telehealth in one month.    [name] MD  Professor of Neurology     I spent 20 minutes on this patient today; >50% of the time was spent counseling the patient on tremors.

## 2021-09-11 ENCOUNTER — Ambulatory Visit: Payer: No Typology Code available for payment source | Admitting: Neurology

## 2021-09-11 ENCOUNTER — Telehealth: Payer: Self-pay | Admitting: Neurology

## 2021-09-11 NOTE — Telephone Encounter (Addendum)
 Patient is currently out of [place] due to work, and Zoom appointment had to be rescheduled to March, which is when he will be coming back to [place].     Patient asked if in the meantime he can have his medication(Primidone 50 mg 2 tablets at bedtime) ad

## 2021-09-11 NOTE — Telephone Encounter (Signed)
Patient states he never took 2 tablets, because with 1 tablet, he was feeling extreme dizziness. He asks for an alternative or advise on what to take? Please advise.     Carmela Rima MA

## 2021-09-13 ENCOUNTER — Encounter: Payer: Self-pay | Admitting: Neurology

## 2021-09-13 ENCOUNTER — Telehealth: Payer: Self-pay | Admitting: Neurology

## 2021-09-13 MED ORDER — RASAGILINE MESYLATE 1 MG OR TABS
1.0000 mg | ORAL_TABLET | Freq: Every day | ORAL | 3 refills | Status: DC
Start: 2021-09-13 — End: 2022-07-08

## 2021-09-13 NOTE — Telephone Encounter (Signed)
 Received fax from [facility] notifying of a DDI for recent prescription Rasagiline(Azilect) interfering with Wellbutrin 100 mg. Please advise if still ok for patient to take these two medications at the same time?     DDI with Bupropion(Wellbutrin) E

## 2021-09-14 NOTE — Telephone Encounter (Signed)
Spoke to pharmacy and relayed MD's message to pharmacist, that it is ok for patient to take Rasagiline and Wellbutrin together.     Carmela Rima MA

## 2021-11-02 ENCOUNTER — Encounter: Payer: Self-pay | Admitting: Neurology

## 2021-11-02 ENCOUNTER — Ambulatory Visit: Payer: No Typology Code available for payment source | Attending: Neurology | Admitting: Neurology

## 2021-11-02 DIAGNOSIS — R258 Other abnormal involuntary movements: Secondary | ICD-10-CM

## 2021-11-02 DIAGNOSIS — G20 Parkinson's disease: Secondary | ICD-10-CM | POA: Insufficient documentation

## 2021-11-02 DIAGNOSIS — R251 Tremor, unspecified: Secondary | ICD-10-CM | POA: Insufficient documentation

## 2021-11-02 MED ORDER — ZONISAMIDE 50 MG OR CAPS
ORAL_CAPSULE | ORAL | 3 refills | Status: DC
Start: 2021-11-02 — End: 2022-02-11

## 2021-11-02 NOTE — Progress Notes (Signed)
 Due to COVID-19 pandemic and a federally declared state of public health emergency, this telemedicine visit was conducted Audio+Video. The patient was in [place] for the duration of the visit.  The patient consented to a virtual visit, understanding the risks, such as a limited examination, and was aware that an in-person visit was available to them. Total time spent was 11-20 minutes.         Subjective:   Hunter Shaw is a 61 year old male   HPI    Hunter Shaw is accompanied by her sons and returns via telehealth for f/u of parkinsonism. She and her husband have moved to an assisted living facility close to their son's home. He states that the higher dose of levodopa has "helped a lot". She is able to walk back and forth to meals and has not fallen. Her tremor is not bothersome. She does get confused easily and her memory is not good.    Review of Systems     Current Outpatient Medications   Medication Sig Dispense Refill   . alendronate (FOSAMAX) 70 MG tablet TAKE ONE TABLET BY MOUTH ONCE A WEEK 28 tablet 0   . carbidopa (LODOSYN) 25 MG tablet Take 1 tablet (25 mg) by mouth 3 times daily. 90 tablet 3   . carbidopa-levodopa (SINEMET) 25-100 MG tablet Take 1.5 tablets by mouth 3 times daily. 135 tablet 3   . citalopram (CELEXA) 10 MG tablet Take 1 tablet (10 mg) by mouth daily. 90 tablet 3   . meloxicam (MOBIC) 7.5 MG tablet      . rasagiline (AZILECT) 1 MG tablet TAKE ONE TABLET BY MOUTH ONCE A DAY 30 tablet 5   . sumatriptan (IMITREX) 100 MG tablet Take 1 tablet (100 mg) by mouth once as needed for Migraine for up to 1 dose. 10 tablet 3     No current facility-administered medications for this visit.     No Known Allergies    Reviewed patients pertinent information related to social history, past medical, past surgical, and family history.     Objective:  Vital signs: There were no vitals taken for this visit.    Physical Exam  Pulmonary:      Effort: Pulmonary effort is normal.   Neurological:       Mental Status: She is alert.      Cranial Nerves: No dysarthria or facial asymmetry.      Comments:   No rest tremor noted  No dyskinesia  Hand grip normal bilat   Psychiatric:         Mood and Affect: Mood normal.         Behavior: Behavior normal.         Assessment/Plan:  Hunter Shaw was seen today for parkinson's syndrome.    Diagnoses and all orders for this visit:    Parkinsonism, unspecified Parkinsonism type (CMS-HCC)    Cognitive decline  -     Consult/Referral to Neurology    Hunter Shaw is doing well with her current treatment. She is now living in assisted living and sees her son much more often. She does get confused easily and has never seen a cognitive Neurology specialist. They agree to referral to see Dr Elveria Rising. I recommend no change in her Parkinson's treatment. She will follow up in person in 3 months.    Hunter Isaacs MD  Professor of Neurology     I spent 20 minutes on this patient today; >50% of the time was

## 2021-11-10 ENCOUNTER — Encounter: Payer: Self-pay | Admitting: Psychiatry

## 2021-11-12 ENCOUNTER — Other Ambulatory Visit: Payer: Self-pay | Admitting: Psychiatry

## 2021-11-12 ENCOUNTER — Encounter: Payer: Self-pay | Admitting: Psychiatry

## 2021-11-12 DIAGNOSIS — G4752 REM sleep behavior disorder: Secondary | ICD-10-CM

## 2021-11-12 MED ORDER — CLONAZEPAM 1 MG OR TABS
1.0000 mg | ORAL_TABLET | Freq: Every evening | ORAL | 3 refills | Status: DC
Start: 2021-11-12 — End: 2021-11-12

## 2021-11-12 MED ORDER — CLONAZEPAM 1 MG OR TABS
1.0000 mg | ORAL_TABLET | Freq: Every evening | ORAL | 3 refills | Status: DC
Start: 2021-11-12 — End: 2021-11-13

## 2021-11-12 NOTE — Addendum Note (Signed)
Addended by: Doran Stabler on: 11/12/2021 01:38 PM     Modules accepted: Orders

## 2021-11-12 NOTE — Telephone Encounter (Signed)
Spoke with pt to schedule 4 month follow up     Pt is asking to speak to Dr.Im regarding medications and refill

## 2021-11-13 ENCOUNTER — Encounter: Payer: Self-pay | Admitting: Psychiatry

## 2021-11-13 DIAGNOSIS — G4752 REM sleep behavior disorder: Secondary | ICD-10-CM

## 2021-11-13 MED ORDER — CLONAZEPAM 1 MG OR TABS
1.0000 mg | ORAL_TABLET | Freq: Every evening | ORAL | 3 refills | Status: DC
Start: 2021-11-13 — End: 2022-02-22

## 2021-11-14 ENCOUNTER — Encounter: Payer: Self-pay | Admitting: Psychiatry

## 2021-11-28 ENCOUNTER — Telehealth: Payer: Self-pay | Admitting: Psychiatry

## 2021-11-28 NOTE — Telephone Encounter (Signed)
Spoke to pt and rescheduled appt

## 2021-12-05 ENCOUNTER — Encounter: Payer: No Typology Code available for payment source | Admitting: Psychiatry

## 2022-02-11 ENCOUNTER — Encounter: Payer: Self-pay | Admitting: Neurology

## 2022-02-11 ENCOUNTER — Ambulatory Visit: Payer: No Typology Code available for payment source | Admitting: Neurology

## 2022-02-11 VITALS — BP 129/93 | HR 58 | Temp 97.0°F | Resp 17 | Ht 74.0 in | Wt 214.0 lb

## 2022-02-11 DIAGNOSIS — G20 Parkinson's disease: Secondary | ICD-10-CM

## 2022-02-11 DIAGNOSIS — R251 Tremor, unspecified: Secondary | ICD-10-CM

## 2022-02-11 MED ORDER — OXYCODONE-ACETAMINOPHEN 7.5-325 MG OR TABS
1.0000 | ORAL_TABLET | Freq: Four times a day (QID) | ORAL | Status: DC | PRN
Start: ? — End: 2022-06-24

## 2022-02-11 MED ORDER — ZONISAMIDE 50 MG OR CAPS
50.0000 mg | ORAL_CAPSULE | Freq: Three times a day (TID) | ORAL | 2 refills | Status: DC
Start: 2022-02-11 — End: 2022-11-26

## 2022-02-11 MED ORDER — VALSARTAN 320 MG OR TABS
320.0000 mg | ORAL_TABLET | Freq: Every day | ORAL | Status: AC
Start: 2021-12-19 — End: ?

## 2022-02-11 MED ORDER — ATENOLOL 25 MG OR TABS
25.0000 mg | ORAL_TABLET | Freq: Every day | ORAL | Status: AC
Start: 2021-11-30 — End: ?

## 2022-02-11 NOTE — Progress Notes (Signed)
Subjective:   Hunter Shaw is a 61 year old male who is here for Tremor      HPI    Pt with tremor and parkinsonism, here for follow up. His tremor is better with treatment with zonisamide and he feels no side effects. Otherwise his movements are good.    Review of Systems     Current Outpatient Medications   Medication Sig Dispense Refill   . alfuzosin (UROXATRAL) 10 MG Sustained-Release tablet Take 1 tablet (10 mg) by mouth daily.     Marland Kitchen atenolol (TENORMIN) 25 MG tablet Take 1 tablet (25 mg) by mouth daily.     Marland Kitchen buPROPion (WELLBUTRIN) 100 MG tablet Take 1 tablet (100 mg) by mouth daily.     . Cholecalciferol (VITAMIN D3) 125 MCG (5000 UT) capsule Take 1 capsule (5,000 Units) by mouth.     . clonazePAM (KLONOPIN) 1 MG tablet Take 1 tablet (1 mg) by mouth nightly. 30 tablet 3   . docusate sodium (COLACE) 250 MG capsule Take 1 capsule (250 mg) by mouth 2 times daily.     . fluticasone propionate (FLONASE) 50 MCG/ACT nasal spray Spray 1 spray into each nostril daily. 16 g 3   . loratadine (CLARITIN) 10 MG tablet Take 1 tablet (10 mg) by mouth daily.     . Naloxegol Oxalate (MOVANTIK PO)      . oxycodone-acetaminophen (PERCOCET) 7.5-325 MG tablet Take 1 tablet by mouth every 6 hours as needed for Severe Pain (Pain Score 7-10).     . pregabalin (LYRICA) 100 MG capsule Take 1 capsule (100 mg) by mouth 3 times daily.     . rasagiline (AZILECT) 1 MG tablet Take 1 mg by mouth daily. 30 tablet 3   . rosuvastatin (CRESTOR) 40 MG tablet Take 0.5 tablets (20 mg) by mouth daily. 20 mg per pt     . Testosterone Undecanoate (JATENZO) 198 MG CAPS BID per pt     . tiZANidine (ZANAFLEX) 4 MG tablet Take 1 tablet (4 mg) by mouth every 6 hours as needed.     . valsartan (DIOVAN) 320 MG tablet Take 1 tablet (320 mg) by mouth daily.     Marland Kitchen zonisamide (ZONEGRAN) 50 MG capsule Take 1 capsule (50 mg) by mouth 3 times daily. 270 capsule 2     No current facility-administered medications for this visit.     Allergies   Allergen Reactions   .  Atorvastatin Swelling and Unspecified       Reviewed patients pertinent information related to social history, past medical, past surgical, and family history.     Objective:  Vital signs: BP (!) 129/93 (BP Location: Left arm, BP Patient Position: Sitting, BP cuff size: Regular)   Pulse 58   Temp 97 F (36.1 C) (Temporal)   Resp 17   Ht 6\' 2"  (1.88 m)   Wt 97.1 kg (214 lb)   BMI 27.48 kg/m     Neurologic Exam     Mental Status   Speech: speech is normal   Level of consciousness: alert    Motor Exam   Muscle bulk: normal  Hand grip 1+ R, normal L     Gait, Coordination, and Reflexes     Tremor   Resting tremor: absent  No postural or action tremor noted    Walks with good stride, a little slow with diminished R arm swing       Assessment/Plan:  Horald Birky was seen today for Tremor  Hunter Shaw is doing better with treatment with zonisamide. His movement is good and he does not need more treatment at this time for parkinsonism. He would like a little tremor treatment and I advised him to add zonisamide 50 mg qam. I encouraged him to exercise and remain active. He will return for follow up via telehealth in 3-4 months.    [name] MD  Professor of Neurology    I personally spent total 30 minutes in face-to-face and non-face-to-face activities  related to the patient's visit today, excluding any separately reportable  services/procedures      Diagnosed with:    ICD-10-CM ICD-9-CM    1. Tremor  R25.1 781.0       2. Parkinsonism, unspecified Parkinsonism type (CMS-HCC)  G20 332.0

## 2022-02-22 ENCOUNTER — Telehealth: Payer: No Typology Code available for payment source | Admitting: Psychiatry

## 2022-02-22 DIAGNOSIS — G4752 REM sleep behavior disorder: Secondary | ICD-10-CM

## 2022-02-22 DIAGNOSIS — G4719 Other hypersomnia: Secondary | ICD-10-CM

## 2022-02-22 DIAGNOSIS — G20 Parkinson's disease: Secondary | ICD-10-CM | POA: Insufficient documentation

## 2022-02-22 DIAGNOSIS — G4733 Obstructive sleep apnea (adult) (pediatric): Secondary | ICD-10-CM

## 2022-02-22 DIAGNOSIS — Z7189 Other specified counseling: Secondary | ICD-10-CM

## 2022-02-22 MED ORDER — CLONAZEPAM 1 MG OR TABS
1.0000 mg | ORAL_TABLET | Freq: Every evening | ORAL | 3 refills | Status: DC
Start: 2022-02-22 — End: 2022-06-25

## 2022-02-22 NOTE — Progress Notes (Signed)
Chief complaint: obstructive sleep apnea and REM sleep behavior disorder (RBD)    Interval history  Goes to bed at 10AM, easily goes to sleep. Around 2-4AM wakes up twice due to dry mouth. Wakes up at 7:30 AM. Believes he is getting 6 hours of sleep/night. ESS: 15/24.    Has been using APAP every night. Has been having issues with CPAP mask. For nose the medium ones get good seal but breathes through mouth. Gets marks on face from wearing mask.    Dry mouth, but no morning headaches. Still feels tired in the morning and persists. In fact, has gotten worse lately. Falling asleep during zoom sessions. Drinks 2 large cups of coffee, all in AM.  Everyday takes nap in the afternoon.     As long as he takes the clonazepam 1mg  and melatonin 20mg  and has not had movements at night. Has had movements during afternoon nap (1 hour).    Adherent to zonisamide 50mg  1 tablet in morning 2 at night for Parkinson's, tremors under control.     History of Present Illness:  I had the pleasure of seeing Hunter Shaw, at the [facility] in virtual clinic visit. He is a 61 year old male who presents for follow-up for his obstructive sleep apnea and REM behavior disorder.     Over interval he was diagnosed with Parkinson disease.   Continues with Klonopin and melatonin for managing RBD, without side effects.     Uses APAP therapy well. Recently since started working in October, the adherence has not been the best.    BT: 2200. Takes clonazepam 1mg  and melatonin 20mg  at the same time  SOL: 10-15 min  WASO: 0-1  WT: 0730  No major acting out dreaming.     Nevertheless recently he started noticing some middle awakening and having difficulty falling back asleep.     DME: [facility]   Machine: AirSense 11 AutoSet   Mask: Nasal pillows      Past medical history:  He  has no past medical history on file.    Past surgical history:  He  has no past surgical history on file.     Social history:  He  reports that he has  never smoked. He has never used smokeless tobacco.     Family history:  His  family history is not on file.     Medications:  He  has a current medication list which includes the following prescription(s): alfuzosin, atenolol, bupropion, vitamin d3, clonazepam, docusate sodium, fluticasone propionate, loratadine, naloxegol oxalate, oxycodone-acetaminophen, pregabalin, rasagiline, rosuvastatin, jatenzo, tizanidine, valsartan, and zonisamide.      Allergies:  He is allergic to atorvastatin.      Physical Exam:  There were no vitals taken for this visit. There is no height or weight on file to calculate BMI.  General:  Alert, cooperative, no distress, appears stated age  Mental status:  Speech and language were normal.  Attention span and concentration were normal.     Data Reviewed during this visit:  Download:  Type of PAP  Type of PAP:: Auto PAP (AIRSENSE 11 AUTOSET)  Pressure  PAP pressure: 5 HRS 48 MINS  DATE  Start Date: 01/22/22  Download period  Number of days: 30  Percentage greater than 4 hours  % > 4 Hrs: 73  Average number of hrs per night when using  Average hours: 5 HRS 48 MINS  AHI  Residual: 0.7  CPAP Compliance  Days  Used: 27 Days  Total Days in Possession: 30 Days  Percentage of Days Used: 90 %  Leaks: 22 liters/hour      IMPRESSION:   Fin Hupp is a very pleasant 61 year old male with T2DM and HTN here for follow up OSA and RBD.      #. Obstructive Sleep Apnea (AHI on most recent Sleep Study: 8.5/hr): uses APAP therapy at 7-12 cmH2O without problems. Currently issues with comfort     #. RBD: on stable dose of klonopin 1mg  hs and melatonin 20mg  hs. No major dream enactment episode over interval.    # Sleepiness: Likely due to limited sleep duration and zonisamide 50mg  TID.    Of note he was eventually diagnosed with Parkinson disease in September 2022.       ICD-10-CM ICD-9-CM    1. OSA (obstructive sleep apnea)  G47.33 327.23       2. RBD (REM behavioral disorder)  G47.52 327.42 clonazePAM  (KLONOPIN) 1 MG tablet      3. Dream enactment behavior  G47.52 327.42       4. CPAP use counseling  Z71.89 V65.49       5. Parkinsonism, unspecified Parkinsonism type (CMS-HCC)  G20 332.0       6. Excessive daytime sleepiness  G47.19 780.54           PLAN:  #. Continue the PAP therapy. Work closely with the DME to regularly update supplies.  #  Work with the DME to find comfortable fitting mask  #  If discomfort with mask persists, can consider split-night study to find appropriate PAP  #. Continue clonazepam 1 mg qHS for RBD.  #  Change to melatonin 10 mg qHS for RBD. (down from 20mg  hs).   #. Make sure you have a safe sleep surrounding - remove all sharp or potentially dangerous objects.   #. He was reminded that he should never drive if drowsy and should pull over at a safe place if he becomes drowsy while driving.  #. Follow up with an NP every 4 months to monitor and renew Rx of clonazepam.        Staff involved in this encounter:   Medical Student: , MS4 and   Attending physician, , MD, MS       MDM: High level as I reviewed and interpreted relevant document/notes, labs, previous sleep study and order a new Rx, changed some of the meds, addressed all possible diagnoses and treatment approaches, and engaged in medical decision with high complexity of severe sleep-related parasomnia (RBD) and OSA with high risk of morbidity related to treatment/complications.         October 2022, MD, MS  Attending Physician  Sleep Medicine    [facility]

## 2022-02-22 NOTE — Patient Instructions (Signed)
-   lower melatonin from 20mg  to 10 mg hs.   - continue clonazepam 1mg  hs  - continue to ensure safe sleep environments.  - increased overnight sleep window to at least 7-8 hours. You current 6 hours is not enough.  - continue the PAP therapy, using a large size nasal mask.   - consider a CPAP titration study if mask discomfort persist.

## 2022-05-06 DIAGNOSIS — G20 Parkinson's disease: Secondary | ICD-10-CM

## 2022-05-06 DIAGNOSIS — R251 Tremor, unspecified: Secondary | ICD-10-CM

## 2022-05-27 ENCOUNTER — Encounter: Payer: No Typology Code available for payment source | Admitting: Neurology

## 2022-05-29 ENCOUNTER — Encounter: Payer: Self-pay | Admitting: Neurology

## 2022-06-12 ENCOUNTER — Encounter: Payer: Self-pay | Admitting: Neurology

## 2022-06-24 ENCOUNTER — Telehealth (INDEPENDENT_AMBULATORY_CARE_PROVIDER_SITE_OTHER): Payer: Commercial Managed Care - PPO | Admitting: Nurse Practitioner

## 2022-06-24 DIAGNOSIS — G4733 Obstructive sleep apnea (adult) (pediatric): Secondary | ICD-10-CM

## 2022-06-24 DIAGNOSIS — G4752 REM sleep behavior disorder: Secondary | ICD-10-CM

## 2022-06-24 NOTE — Progress Notes (Signed)
 Hunter Shaw  MR #: [id number]  DOB: Apr 23, 1961    Zoom Meeting :    This service is being conducted via video.   The patient confirmed to be in the [place] presently.    The patient gave consent to proceed with the video/telephone visit today    I had the pleasure of seeing Hunter Shaw for follow up at the [facility].  He is a 61 year old male with PMH of Parkinson's, RBD, OSA , anxiety, chronic back pain, diabetes 1.5, dyslipidemia, BPH, HTN, peripheral neuropathy, who presents for follow-up of OSA and REM behavioral disorder    Interval History:  Since the last visit, Hunter Shaw he tres a chin strap and it did not work, he has a full face mask and it cause abrasions     Last visit is clonazepam was reduced but has not had an increase in dream enactment.      Machine: [employer]Med, APAP  Mask: Nasal pillows,    Epworth Sleep Scale Score: 14    FOSQ Score: 14    Sleep wake routine was reviewed:   Usual bedtime: 930-10 pm   Wake up time: 30-45 min   Reported sleep latency: 550 am   Reported hours of sleep/night: 6 hours  Number of awakenings during the night/Duration: 3-4 times  Daytime napping: yes,  nods off at work and then naps for    Use medications/supplements to sleep?: yes , clonazepam  and melatonin      Past medical history:  He  has no past medical history on file.    Past surgical history:  He  has no past surgical history on file.     Social history:  He  reports that he has never smoked. He has never used smokeless tobacco.     Family history:  His  family history is not on file.     Medications:  He  has a current medication list which includes the following prescription(s): alfuzosin, atenolol, bupropion, vitamin d3, clonazepam, docusate sodium, fluticasone propionate, loratadine, naloxegol oxalate, oxycodone-acetaminophen, pregabalin, rasagiline, rosuvastatin, jatenzo, tizanidine, valsartan, and zonisamide.      Allergies:  He is allergic to atorvastatin.     Review of  Systems:  10/14 systems reviewed, all negative except where mentioned in HPI or below:    Physical Exam:  There were no vitals taken for this visit.   General:  Well developed, well nourished male, who looks as stated age of 61 year old. No acute distress.   Neurological: Alert and Oriented x 3. Grossly normal, normal and symmetrical strength  Psychiatric: Normal mental status, mood is euthymic    LABORATORY DATA:  TSH: No results found for: "TSH"   CBC: No results found for: "WBC", "RBC", "HGB", "HCT", "MCV", "MCH", "MCHC", "RDW", "PLT", "MPV"   Glucose: No results found for: "GLU"  Ferritin: No results found for: "FERRITIN"   Iron Saturation: No results found for: "PSAT"   B12: No results found for: "B12"   No results found for: "NA", "K", "CL", "BICARB", "BUN", "CREAT", "GLU", "[place]", "ALK", "AST", "ALT", "TP", "ALB", "TBILI"    PAP Download   Type of PAP  Type of PAP:: Auto PAP (AIRSENSE 11 AUTOSET)  Pressure  PAP pressure: MIN 7 MAX 12  DATE  Start Date: 05/25/22  Download period  Number of days: 30  Percentage greater than 4 hours  % > 4 Hrs: 90  Average number of hrs per night when  using  Average hours: 6HRS  AHI  Residual: 1.3  CPAP Compliance  Days Used: 30 Days  Total Days in Possession: 30 Days  Percentage of Days Used: 100 %  Leaks: 28.8 liters/hour    IMPRESSION:   Hunter Shaw is a 61 year old male with PMH of Parkinson's, RBD, OSA , anxiety, chronic back pain, diabetes 1.5, dyslipidemia, BPH, HTN, peripheral neuropathy who presents today for follow up of OSA on PAP therapy and REM behavioral disorder    #. Obstructive Sleep Apnea:  PSG performed on 03/23/2020 with a sleep efficiency of 64% demonstrated an AHI of 8.5/hr He is using and benefitting from PAP therapy demonstrated by a residual AHI of 1.3 HR and days used 100%    #. RBD:  Continue clonazepam and melatonin no increase in dream enactment behavior.    PLAN:  #.  Continue clonazepam 1 mg bedtime for RBD.  Yearly urine drug screen to be done  next visit  #.  Continue melatonin 20 mg at bedtime forRBD  #.  Risk of untreated sleep breathing disorder reviewed   #. Remember to wear PAP device during all hours of sleep.  #. Sleep hygiene was discussed with the patient.  Regular bedtime and wake time, along with exercise and exercise timing were emphasized.   #. It is recommended to not drive or operate heavy machinery while sleepy as this increases the risk of injury or death.   #. Follow up with NP in 3 months. Please contact clinic if you have any questions or concerns that arise before your next appointment.  #.  Use Flonase daily for the next 6 weeks  #.  Short naps to 30 minutes or less.  #.  Please address your mask leak either use a chinstrap or possibly a fullface mask    Sincerely,  Kirtland Bouchard NP-C    I have personally provided of clinical care time. Time includes review of results, clarification of symptoms, treatment recommendations, communication to arrange next steps and documentation of the above.    PLEASE NOTE THAT PORTIONS OF THE ABOVE NOTE  DICTATED WITH M-MODAL VOICE ACTIVATED SOFTWARE WHICH MAY HAVE PRODUCED MINIMAL TYPOGRAPHICAL ERRORS OR SOUND-ALIKE WORD CHANGES.

## 2022-06-24 NOTE — Patient Instructions (Addendum)
After visit summary          Please follow up in sleep center as needed or in  6 weeks our phone number is [phone]    Flonase use daily for the next 6 weeks     Increase sleep time to 7-1/2 hours with PAP therapy    Short naps so that you are able to sleep at night keep naps to 30 minutes or less.    Address mask leak meeting wear a chinstrap or change the type of mask using        If a Consult or referral has been placed for you . Please wait a few days for it to be processed. Then you can call the main line at [phone] and they can direct you.    For resources about sleep and sleep issues you can go to:  Sleep foundation: .sleepfoundation.org  American Academy of sleep medicine: aasm.org    For Billing issues:Use my chart or see below.   Single Billing Office (SBO)  [address]  [place], [place] [address]  [phone]  Phone hours: 8:30 a.m.-4 p.m., Monday through Friday    Medical Records  Need a copy of your medical records? [facility] offers two ways to get them.  MyChart Patient Portal  You may request -- and receive -- most of your medical records through a [facility] MyChart patient portal account. This online portal not only gives you access to most of your medical records, it also allows you to schedule appointments, communicate with your care team, request prescription renewals and more.  Log into MyChart >     Note: Some medical records may be available only through our [facility] Records department.  Printed Copies  To obtain a printed copy of your medical records, please follow these steps:  Download and print an Authorization for Release of Health Information form in English or download and print an Authorization for Release of Health Information form in Spanish  Complete, sign and date the form. In order to verify your identification and validate your authorization, you are required to include a legible copy of a valid photo identification (e.g., a driver's license, a  military ID or a state ID).  Email, fax, mail or deliver your paperwork in person.  Medical record fees: There is no charge to send your medical records to another healthcare provider. For records released directly to a patient or an authorized family member there is no charge for the first 20 pages. Additional pages are charged at $0.25 per page.  Email  We accept email requests only from patients and providers at [email or web address]  All attorney, copy service and/or subpoena requests must be delivered to the Health Information Management Department.  Mail  Mail your request for a paper copy of your medical records to:  Medical Correspondence  [facility]  [address] [place], [address]  [place], [place] [address]  Please allow seven to 10 business days for processing from date of receipt of the completed authorization. [place] law permits up to 15 days to respond to requests for medical records.   Contact us  Our hours of operation are Monday through Friday, 8 a.m. to 4:30 p.m., excluding holidays. You may reach [place] at:  Phone: [phone]  Fax: [phone]  Urgent Requests/Records for Your Physician  For immediate continuity of care, you or your healthcare providers may request that records to be sent directly to their  office.  The physician's office must send a written request on business letterhead to [phone], indicating the patient's name, date of birth and date of visit. Please write "STAT" at the top of the request. For assistance, call [phone], press option 5, followed by option 2.

## 2022-06-25 ENCOUNTER — Telehealth: Payer: Self-pay | Admitting: Nurse Practitioner

## 2022-06-25 ENCOUNTER — Other Ambulatory Visit: Payer: Self-pay | Admitting: Psychiatry

## 2022-06-25 DIAGNOSIS — G4752 REM sleep behavior disorder: Secondary | ICD-10-CM

## 2022-06-25 MED ORDER — CLONAZEPAM 1 MG OR TABS
1.0000 mg | ORAL_TABLET | Freq: Every evening | ORAL | 3 refills | Status: DC
Start: 2022-06-25 — End: 2022-09-30

## 2022-06-25 NOTE — Telephone Encounter (Signed)
LVM informing pt that refill was sent to pharmacy at 12:54pm today and he should be able to pick up soon.

## 2022-06-25 NOTE — Telephone Encounter (Signed)
Cure done no red flags or Dr shopping noted

## 2022-06-25 NOTE — Telephone Encounter (Signed)
patient requesting prescription refill for   Medication and Dosage:  clonazePAM (KLONOPIN) 1 MG tablet  to be filled at:   Pharmacy:   [facility] [id number] [place] - [address] [place].    Patient's current medications:  Current Outpatient Medications   Medication Instructions    alfuzosin (UROXATRAL) 10 mg, Oral, DAILY    atenolol (TENORMIN) 25 mg, Oral, DAILY    buPROPion (WELLBUTRIN) 100 mg, Oral, DAILY    Cholecalciferol (VITAMIN D3) 125 MCG (5000 UT) capsule 1 capsule, Oral    clonazePAM (KLONOPIN) 1 mg, Oral, NIGHTLY    docusate sodium (COLACE) 250 mg, Oral, 2 TIMES DAILY    fluticasone propionate (FLONASE) 50 MCG/ACT nasal spray 1 spray, Each [place], DAILY    loratadine (CLARITIN) 10 mg, Oral, DAILY    Naloxegol Oxalate (MOVANTIK PO) No dose, route, or frequency recorded.    pregabalin (LYRICA) 100 mg, Oral, 3 TIMES DAILY    rasagiline (AZILECT) 1 mg, Oral, DAILY    rosuvastatin (CRESTOR) 20 mg, Oral, DAILY, 20 mg per pt    Testosterone Undecanoate (JATENZO) 198 MG CAPS BID per pt    tiZANidine (ZANAFLEX) 4 mg, Oral, EVERY 6 HOURS PRN    valsartan (DIOVAN) 320 mg, Oral, DAILY    zonisamide (ZONEGRAN) 50 mg, Oral, 3 TIMES DAILY       Do you have less than 5 days of meds remaining? Don't Know

## 2022-07-08 ENCOUNTER — Encounter: Payer: Self-pay | Admitting: Neurology

## 2022-07-08 ENCOUNTER — Ambulatory Visit: Payer: Commercial Managed Care - PPO | Admitting: Neurology

## 2022-07-08 DIAGNOSIS — G20C Parkinsonism, unspecified (CMS-HCC): Secondary | ICD-10-CM

## 2022-07-08 DIAGNOSIS — R251 Tremor, unspecified: Secondary | ICD-10-CM

## 2022-07-08 NOTE — Progress Notes (Signed)
This telemedicine visit was conducted Audio+Video. The patient was in [place] for the duration of the visit.  The patient consented to a virtual visit, understanding the risks, such as a limited examination, and was aware that an in-person visit was available to them. Total time spent was 11-20 minutes.          Subjective :   Hunter Shaw is a 61 year old male   HPI    Pt with tremor, parkinsonism, returns for follow up via telehealth. He is on zonisamide 50 mg tid and his tremor is under good control. He rarely notices a tremor. He recently moved to [place] but will keep his current physicians.      Review of Systems     Current Outpatient Medications   Medication Sig Dispense Refill    atenolol (TENORMIN) 25 MG tablet Take 1 tablet (25 mg) by mouth daily.      buPROPion (WELLBUTRIN) 100 MG tablet Take 1 tablet (100 mg) by mouth daily.      Cholecalciferol (VITAMIN D3) 125 MCG (5000 UT) capsule Take 1 capsule (5,000 Units) by mouth.      clonazePAM (KLONOPIN) 1 MG tablet Take 1 tablet (1 mg) by mouth nightly. 30 tablet 3    docusate sodium (COLACE) 250 MG capsule Take 1 capsule (250 mg) by mouth 2 times daily.      fluticasone propionate (FLONASE) 50 MCG/ACT nasal spray Spray 1 spray into each nostril daily. 16 g 3    loratadine (CLARITIN) 10 MG tablet Take 1 tablet (10 mg) by mouth daily.      Naloxegol Oxalate (MOVANTIK PO)       pregabalin (LYRICA) 100 MG capsule Take 1 capsule (100 mg) by mouth 3 times daily.      rosuvastatin (CRESTOR) 40 MG tablet Take 0.5 tablets (20 mg) by mouth daily. 20 mg per pt      Testosterone Undecanoate (JATENZO) 198 MG CAPS BID per pt      valsartan (DIOVAN) 320 MG tablet Take 1 tablet (320 mg) by mouth daily.      zonisamide (ZONEGRAN) 50 MG capsule Take 1 capsule (50 mg) by mouth 3 times daily. 270 capsule 2     No current facility-administered medications for this visit.     Allergies   Allergen Reactions    Atorvastatin Swelling and Unspecified        Reviewed patients pertinent information related to social history, past medical, past surgical, and family history.     Objective :  Vital signs: There were no vitals taken for this visit.    Physical Exam  Pulmonary:      Effort: Pulmonary effort is normal.   Neurological:      Mental Status: He is alert.      Cranial Nerves: No dysarthria or facial asymmetry.      Comments:   No rest tremor  No postural of action tremor noted  Hand grip 2+ R, 1+ L   Psychiatric:         Mood and Affect: Mood normal.         Behavior: Behavior normal.          Assessment/Plan:  Hunter Shaw was seen today for tremor.    Diagnoses and all orders for this visit:    Tremor    Parkinsonism, unspecified Parkinsonism type    Hunter Shaw is doing well with treatment with zonisamide. His tremor is not bothersome and there is nothing he cannot do  because of it. He is very pleased with his treatment. He has moved up [place] but will keep his house down here into February and will commute for medical appointments. He will return for follow up in 2-3 months.    Ines Bloomer MD  Professor of Neurology    I personally spent total 20 minutes in face-to-face and non-face-to-face activities  related to the patient's visit today, excluding any separately reportable  services/procedures

## 2022-07-16 ENCOUNTER — Encounter: Payer: Self-pay | Admitting: Psychiatry

## 2022-07-26 ENCOUNTER — Telehealth: Payer: Self-pay | Admitting: Psychiatry

## 2022-07-26 NOTE — Telephone Encounter (Signed)
Patient requesting prescription for clonazePAM (KLONOPIN) 1 MG tablet be sent to [facility] [address] [place] [address].    Patient would like this pharmacy listed as preferred pharmacy.    Please assist

## 2022-07-26 NOTE — Telephone Encounter (Signed)
Pharm updated pt has additional 3 refills

## 2022-08-07 ENCOUNTER — Encounter: Payer: Self-pay | Admitting: Nurse Practitioner

## 2022-08-07 ENCOUNTER — Ambulatory Visit: Payer: Commercial Managed Care - PPO | Admitting: Nurse Practitioner

## 2022-08-07 NOTE — Progress Notes (Deleted)
Hunter Shaw  MR #: [id number]  DOB: [birth year]    Zoom Meeting :    This service is being conducted via video.   The patient confirmed to be in the [place] presently.    The patient gave consent to proceed with the video/telephone visit today    I had the pleasure of seeing Hunter Shaw for follow up at the [facility].  He is a 61 year old male with PMH of Parkinson's, RBD, OSA , anxiety, chronic back pain, diabetes 1.5, dyslipidemia, BPH, HTN, peripheral neuropathy, who presents for follow-up of OSA and REM behavioral disorder    Interval History:  Since the last visit, Hunter Shaw he tres a chin strap and it did not work, he has a full face mask and it cause abrasions     Last visit is clonazepam was reduced but has not had an increase in dream enactment.      Machine: ResMed, APAP  Mask: Nasal pillows,              Sleep wake routine was reviewed:   Usual bedtime: 930-10 pm   Wake up time: 30-45 min   Reported sleep latency: 550 am   Reported hours of sleep/night: 6 hours  Number of awakenings during the night/Duration: 3-4 times  Daytime napping: yes,  nods off at work and then naps for    Use medications/supplements to sleep?: yes , clonazepam  and melatonin      Past medical history:  He  has no past medical history on file.    Past surgical history:  He  has no past surgical history on file.     Social history:  He  reports that he has never smoked. He has never used smokeless tobacco.     Family history:  His  family history is not on file.     Medications:  He  has a current medication list which includes the following prescription(s): atenolol, bupropion, vitamin d3, clonazepam, docusate sodium, fluticasone propionate, loratadine, naloxegol oxalate, pregabalin, rosuvastatin, jatenzo, valsartan, and zonisamide.      Allergies:  He is allergic to atorvastatin.     Review of Systems:  10/14 systems reviewed, all negative except where mentioned in HPI or below:    Physical Exam:  There  were no vitals taken for this visit.   General:  Well developed, well nourished male, who looks as stated age of 61 year old. No acute distress.   Neurological: Alert and Oriented x 3. Grossly normal, normal and symmetrical strength  Psychiatric: Normal mental status, mood is euthymic    LABORATORY DATA:  TSH: No results found for: "TSH"   CBC: No results found for: "WBC", "RBC", "HGB", "HCT", "MCV", "MCH", "MCHC", "RDW", "PLT", "MPV"   Glucose: No results found for: "GLU"  Ferritin: No results found for: "FERRITIN"   Iron Saturation: No results found for: "PSAT"   B12: No results found for: "B12"   No results found for: "NA", "K", "CL", "BICARB", "BUN", "CREAT", "GLU", "[place]", "ALK", "AST", "ALT", "TP", "ALB", "TBILI"    PAP Download   Type of PAP  Type of PAP:: Auto PAP (AIRSENSE 11 AUTOSET)  Pressure  PAP pressure: MIN 7 MAX 12  DATE  Start Date: 07/03/22  Download period  Number of days: 30  Percentage greater than 4 hours  % > 4 Hrs: 73  Average number of hrs per night when using  Average hours: 6HRS 7MIN  AHI  Residual: 0.4  CPAP Compliance  Days Used: 25 Days  Total Days in Possession: 30 Days  Percentage of Days Used: 83.33 %  Leaks: 20.5 liters/hour    IMPRESSION:   Hunter Shaw is a 61 year old male with PMH of Parkinson's, RBD, OSA , anxiety, chronic back pain, diabetes 1.5, dyslipidemia, BPH, HTN, peripheral neuropathy who presents today for follow up of OSA on PAP therapy and REM behavioral disorder    #. Obstructive Sleep Apnea:  PSG performed on 03/23/2020 with a sleep efficiency of 64% demonstrated an AHI of 8.5/hr He is using and benefitting from PAP therapy demonstrated by a residual AHI of 1.3 HR and days used 100%    #. RBD:  Continue clonazepam and melatonin no increase in dream enactment behavior.    PLAN:  #.  Continue clonazepam 1 mg bedtime for RBD.  Yearly urine drug screen to be done next visit  #.  Continue melatonin 20 mg at bedtime forRBD  #.  Risk of untreated sleep breathing disorder  reviewed   #. Remember to wear PAP device during all hours of sleep.  #. Sleep hygiene was discussed with the patient.  Regular bedtime and wake time, along with exercise and exercise timing were emphasized.   #. It is recommended to not drive or operate heavy machinery while sleepy as this increases the risk of injury or death.   #. Follow up with NP in 3 months. Please contact clinic if you have any questions or concerns that arise before your next appointment.  #.  Use Flonase daily for the next 6 weeks  #.  Short naps to 30 minutes or less.  #.  Please address your mask leak either use a chinstrap or possibly a fullface mask    Sincerely,  Hunter Columbus NP-C    I have personally provided 82mnutes of clinical care time. Time includes review of results, clarification of symptoms, treatment recommendations, communication to arrange next steps and documentation of the above.    PLEASE NOTE THAT PORTIONS OF THE ABOVE NOTE  DICTATED WITH M-MODAL VOICE ACTIVATED SOFTWARE WHICH MAY HAVE PRODUCED MINIMAL TYPOGRAPHICAL ERRORS OR SOUND-ALIKE WORD CHANGES.

## 2022-08-07 NOTE — Telephone Encounter (Signed)
LVM TO C/B AND R/S F/U APPT

## 2022-08-19 ENCOUNTER — Telehealth: Payer: Commercial Managed Care - PPO | Admitting: Nurse Practitioner

## 2022-08-19 DIAGNOSIS — G4752 REM sleep behavior disorder: Secondary | ICD-10-CM

## 2022-08-19 DIAGNOSIS — G4733 Obstructive sleep apnea (adult) (pediatric): Secondary | ICD-10-CM

## 2022-08-19 NOTE — Patient Instructions (Signed)
After visit summary          Please follow up in sleep center as needed or in  8 weeks our phone number is [phone]    RemZzzs CPAP Mask Liners continue to use PAP therapy and try the liners    Decrease melatonin by 5 mg every 10 days. Goal is to keep it bewtween 10-15 mg if possible      If a Consult or referral has been placed for you . Please wait a few days for it to be processed. Then you can call the main line at [phone] and they can direct you.    For resources about sleep and sleep issues you can go to:  Sleep foundation: .sleepfoundation.org  American Academy of sleep medicine: aasm.org    For Billing issues:Use my chart or see below.   Single Billing Office (SBO)  [address], [address], [place] [address]  [phone]  Phone hours: 8:30 a.m.-4 p.m., Monday through Friday    Medical Records  Need a copy of your medical records? [facility] offers two ways to get them.  MyChart Patient Portal  You may request -- and receive -- most of your medical records through a [facility] patient portal account. This online portal not only gives you access to most of your medical records, it also allows you to schedule appointments, communicate with your care team, request prescription renewals and more.  Log into MyChart >     Note: Some medical records may be available only through our [facility] Records department.  Printed Copies  To obtain a printed copy of your medical records, please follow these steps:  Download and print an Authorization for Release of Health Information form in English or download and print an Authorization for Release of Health Information form in Spanish  Complete, sign and date the form. In order to verify your identification and validate your authorization, you are required to include a legible copy of a valid photo identification (e.g., a driver's license, a military ID or a state ID).  Email, fax, mail or deliver your paperwork in person.  Medical record  fees: There is no charge to send your medical records to another healthcare provider. For records released directly to a patient or an authorized family member there is no charge for the first 34 pages. Additional pages are charged at $0.25 per page.  Email  We accept email requests only from patients and providers at [email or web address]  All attorney, copy service and/or subpoena requests must be delivered to the Health Information Management Department.  Mail  Mail your request for a paper copy of your medical records to:  [place], [address]  [place] [address]  Please allow seven to 10 business days for processing from date of receipt of the completed authorization. [place] law permits up to 15 days to respond to requests for medical records.   Contact us  Our hours of operation are Monday through Friday, 8 a.m. to 4:30 p.m., excluding holidays. You may reach [place] at:  Phone: [phone]  Fax: [phone]  Urgent Requests/Records for Your Physician  For immediate continuity of care, you or your healthcare providers may request that records to be sent directly to their office.  The physician's office must send a written request on business letterhead to [phone], indicating the patient's name, date of birth and date of visit. Please  write "STAT" at the top of the request. For assistance, call [phone], press option 5, followed by option 2.

## 2022-08-19 NOTE — Progress Notes (Signed)
LVM TO CALL us BACK TO ASSIST WITH SCHEDULING 8WK FOLLOW UP WITH DR IM

## 2022-08-19 NOTE — Progress Notes (Signed)
Hunter Shaw  MR #: [id number]  DOB: 1961-01-25    Zoom Meeting :    This service is being conducted via video.   The patient confirmed to be in the [place] of [place] presently.    The patient gave consent to proceed with the video/telephone visit today      I had the pleasure of seeing Hunter Shaw for follow up at the [facility].  @CAPHEUC @ is a 62 year old male with PMH of Parkinson's, RBD, OSA , anxiety, chronic back pain, diabetes 1.5, dyslipidemia, BPH, HTN, peripheral neuropathy, who presents for follow-up of OSA on PAP and RBD    Interval History:  Since the last visit, Hunter Shaw last visit he had no dream an accident behavior.  This visit he is having dreams he needs to use the restroom and has avoided in bed or wakes up and has to rush to the restroom.  He was more comfortable with reducing the melatonin prior to reducing the clonazepam.    Continues to have issues using a full face mask causes abrasions.        Machine: ResMed, APAP  Mask:  Fullface mask    Epworth Sleep Scale Score: 7    FOSQ Score: 17    Sleep wake routine was reviewed:   Usual bedtime:930-10 pm   Wake up time:  4-5  Reported sleep latency: 15-20 min  Reported hours of sleep/night: 6 hours  Number of awakenings during the night/Duration: 1-2/  Daytime napping: no   Use medications/supplements to sleep?: yes , clonazepam 1 mg  and melatonin 20 mg      Past medical history:    has no past medical history on file.    Past surgical history:    has no past surgical history on file.     Social history:  @CAPHEUC @  reports that he has never smoked. He has never used smokeless tobacco.     Family history:  @CAPHISUC @  family history is not on file.     Medications:  @CAPHEUC @  has a current medication list which includes the following prescription(s): atenolol, bupropion, vitamin d3, clonazepam, docusate sodium, fluticasone propionate, loratadine, naloxegol oxalate, pregabalin, rosuvastatin, jatenzo, valsartan, and zonisamide.       Allergies:  @CAPHEUC @ is allergic to atorvastatin.     Review of Systems:  10/14 systems reviewed, all negative except where mentioned in HPI or below:    Physical Exam:  There were no vitals taken for this visit.   General:  Well developed, well nourished male, who looks as stated age of 62 year old. No acute distress.   Neurological: Alert and Oriented x 3. Grossly normal, normal and symmetrical strength  Psychiatric: Normal mental status, mood is euthymic    LABORATORY DATA:  TSH: No results found for: "TSH"   CBC: No results found for: "WBC", "RBC", "HGB", "HCT", "MCV", "MCH", "MCHC", "RDW", "PLT", "MPV"   Glucose: No results found for: "GLU"  Ferritin: No results found for: "FERRITIN"   Iron Saturation: No results found for: "PSAT"   B12: No results found for: "B12"   No results found for: "NA", "K", "CL", "BICARB", "BUN", "CREAT", "GLU", "[place]", "ALK", "AST", "ALT", "TP", "ALB", "TBILI"    PAP Download   Type of PAP  Type of PAP:: Auto PAP (AIRSENSE 11 AUTOSET)  Pressure  PAP pressure: MIN 7 MAX 12  DATE  Start Date: 07/17/22  Download period  Number of days: 30  Percentage greater  than 4 hours  % > 4 Hrs: 60  Average number of hrs per night when using  Average hours: 6HRS 4MIN  AHI  Residual: 0.5  CPAP Compliance  Days Used: 23 Days  Total Days in Possession: 30 Days  Percentage of Days Used: 76.67 %  Leaks: 15.2 liters/hour    IMPRESSION:   Hunter Shaw is a 62 year old male with PMH of Parkinson's, RBD, OSA , anxiety, chronic back pain, diabetes 1.5, dyslipidemia, BPH, HTN, peripheral neuropathy who presents today for follow up on OSA and RBD    #. Obstructive Sleep Apnea:  PSG performed on 03/23/2020 with a sleep efficiency of 64% demonstrated an AHI of 8.5/hr He is using and benefitting from PAP therapy demonstrated by a residual AHI of 0.5 HR and days used 76%.  Patient is therapeutically treated when he uses therapy but needs to increase adherence    #. RBD:  Continue clonazepam at 1 mg and decrease  melatonin melatonin.  He is having dreams he has to go to the bathroom has had an accident or will get up and it is an emergency to get to the bathroom.  Discussed possible over-sedation from the clonazepam.  He would like to start by 1st reducing the melatonin.  Discussed with patient that we might need to reduce clonazepam to 0.75 mg  PLAN:  #.  Continue clonazepam 1 mg bedtime for RBD.  Yearly urine drug screen is needed  #.  Decrease Melatonin By 5 mg every 10 days and monitor RBD  melatonin 20 mg at bedtime for RBD  #.  Risk of untreated sleep breathing disorder reviewed   #.  Continue to use PAP therapy any time you sleep.  #. I do not make important decisions while sleepy tired or sedated this can increase risk for mistakes  #. Follow up with MD in 8 weeks. Please contact clinic if you have any questions or concerns that arise before your next appointment.  #. He tried mask fit AR and it gave him the same mask is using now.  It gives him abrasions on his face.  Discussed using REMZZZ   or other mask barriers  #.  Discussed realistic goals for dream enactment behavior    Sincerely,  Garrison Columbus NP-C    I have personally provided 25 minutes of clinical care time. Time includes review of results, clarification of symptoms, treatment recommendations, communication to arrange next steps and documentation of the above.    PLEASE NOTE THAT PORTIONS OF THE ABOVE NOTE  DICTATED WITH M-MODAL VOICE ACTIVATED SOFTWARE WHICH MAY HAVE PRODUCED MINIMAL TYPOGRAPHICAL ERRORS OR SOUND-ALIKE WORD CHANGES.

## 2022-09-30 ENCOUNTER — Other Ambulatory Visit: Payer: Self-pay | Admitting: Nurse Practitioner

## 2022-09-30 DIAGNOSIS — G4752 REM sleep behavior disorder: Secondary | ICD-10-CM

## 2022-10-02 MED ORDER — CLONAZEPAM 1 MG OR TABS
1.0000 mg | ORAL_TABLET | Freq: Every evening | ORAL | 0 refills | Status: DC
Start: 2022-10-02 — End: 2022-10-23

## 2022-10-08 ENCOUNTER — Ambulatory Visit: Payer: Commercial Managed Care - PPO | Admitting: Neurology

## 2022-10-08 ENCOUNTER — Encounter: Payer: Self-pay | Admitting: Neurology

## 2022-10-08 DIAGNOSIS — R251 Tremor, unspecified: Secondary | ICD-10-CM

## 2022-10-08 DIAGNOSIS — G20C Parkinsonism, unspecified: Secondary | ICD-10-CM

## 2022-10-08 NOTE — Addendum Note (Signed)
Addended by: Carmela Rima on: 10/08/2022 04:22 PM     Modules accepted: Orders

## 2022-10-08 NOTE — Progress Notes (Signed)
This telemedicine visit was conducted Audio+Video. The patient was in [place] for the duration of the visit.  The patient consented to a virtual visit, understanding the risks, such as a limited examination, and was aware that an in-person visit was available to them. Total time spent was 11-20 minutes.          Subjective :   Hunter Shaw is a 62 year old male   HPI    Pt with tremor returns for follow up via telehealth. He is now living in [facility] in [place]. His tremor is "pretty good" and he feels that his medications is working well. He does note an occasional tremor in his hands but this is not bothersome.      Review of Systems     Current Outpatient Medications   Medication Sig Dispense Refill    atenolol (TENORMIN) 25 MG tablet Take 1 tablet (25 mg) by mouth daily.      buPROPion (WELLBUTRIN) 100 MG tablet Take 1 tablet (100 mg) by mouth daily.      Cholecalciferol (VITAMIN D3) 125 MCG (5000 UT) capsule Take 1 capsule (5,000 Units) by mouth.      clonazePAM (KLONOPIN) 1 MG tablet Take 1 tablet (1 mg) by mouth nightly. 30 tablet 0    docusate sodium (COLACE) 250 MG capsule Take 1 capsule (250 mg) by mouth 2 times daily.      fluticasone propionate (FLONASE) 50 MCG/ACT nasal spray Spray 1 spray into each nostril daily. 16 g 3    loratadine (CLARITIN) 10 MG tablet Take 1 tablet (10 mg) by mouth daily.      Naloxegol Oxalate (MOVANTIK PO)       pregabalin (LYRICA) 100 MG capsule Take 1 capsule (100 mg) by mouth 3 times daily.      rosuvastatin (CRESTOR) 40 MG tablet Take 0.5 tablets (20 mg) by mouth daily. 20 mg per pt      Testosterone Undecanoate (JATENZO) 198 MG CAPS BID per pt      valsartan (DIOVAN) 320 MG tablet Take 1 tablet (320 mg) by mouth daily.      zonisamide (ZONEGRAN) 50 MG capsule Take 1 capsule (50 mg) by mouth 3 times daily. 270 capsule 2     No current facility-administered medications for this visit.     Allergies   Allergen Reactions    Atorvastatin Swelling and  Unspecified       Reviewed patients pertinent information related to social history, past medical, past surgical, and family history.     Objective :  Vital signs: There were no vitals taken for this visit.    Physical Exam  Pulmonary:      Effort: Pulmonary effort is normal.   Neurological:      Mental Status: He is alert.      Cranial Nerves: No dysarthria.      Comments:   No dyskinesia  No rest tremor noted  Hand grip 1.5 bilat   Psychiatric:         Mood and Affect: Mood normal.         Behavior: Behavior normal.          Assessment/Plan:  Hunter Shaw was seen today for tremor.    Diagnoses and all orders for this visit:    Tremor    Parkinsonism, unspecified Parkinsonism type    Mr Hunter Shaw is doing well with treatment with zonisamide. He does not need any changes at this time. He is going to remain in  [place] and I advised him to establish care with a Product manager. I advised him of the [facility] in [place] and recommend he see someone there. I will continue to fill his prescription until he gets established. He will follow up prn.    Hunter Bloomer MD  Professor of Neurology    I personally spent total 20 minutes in face-to-face and non-face-to-face activities  related to the patient's visit today, excluding any separately reportable  services/procedures

## 2022-10-14 ENCOUNTER — Encounter: Payer: Self-pay | Admitting: Neurology

## 2022-10-14 ENCOUNTER — Encounter: Payer: Self-pay | Admitting: Nurse Practitioner

## 2022-10-15 NOTE — Telephone Encounter (Signed)
LVM INFORMING NEXT AVAILABLE IS APRIL. ADVISED TO GIVE A C/B TO SCHEDULE APPT.

## 2022-10-23 ENCOUNTER — Telehealth: Payer: Commercial Managed Care - PPO | Admitting: Nurse Practitioner

## 2022-10-23 DIAGNOSIS — G4752 REM sleep behavior disorder: Secondary | ICD-10-CM

## 2022-10-23 DIAGNOSIS — G4733 Obstructive sleep apnea (adult) (pediatric): Secondary | ICD-10-CM

## 2022-10-23 MED ORDER — CLONAZEPAM 1 MG OR TABS
1.0000 mg | ORAL_TABLET | Freq: Every evening | ORAL | 2 refills | Status: DC
Start: 2022-10-23 — End: 2023-02-04

## 2022-10-23 NOTE — Progress Notes (Addendum)
Hunter Shaw  MR #: [id number]  DOB: [birth year]    Zoom Meeting :    This service is being conducted via video.   The patient confirmed to be in the [place] of [place] presently.    The patient gave consent to proceed with the video/telephone visit today      I had the pleasure of seeing Hunter Shaw for follow up at the [facility].  He is a 62 year old male with PMH of Parkinson's, RBD, OSA , anxiety, chronic back pain, diabetes 1.5, dyslipidemia, BPH, HTN, peripheral neuropathy, who presents for follow-up of RBD and medication changes.  OSA on PAP therapy    Interval History:  Since the last visit, Hunter Shaw is no longer having dreams that he has to void. But recently his wife has saved him form falling out of bed.  He decreased the melatonin form 20 to 10 mg.  After seeing his urologist he has no longer having dreams causing emergent runs to the bathroom to void    He has been using a full face mask when his nose is clogged and the nasal mask on other days       Machine: ResMed, APAP  Mask:  Fullface mask    Epworth Sleep Scale Score: 7    FOSQ Score: 17    Sleep wake routine was reviewed:   Usual bedtime:10  pm -12 am    Wake up time:  5-6 am with out an alarm  Reported sleep latency:  quickly   Reported hours of sleep/night: 6 hours  Number of awakenings during the night/Duration: 1-2/ short amount of time  Daytime napping: no   Use medications/supplements to sleep?: yes , clonazepam 1 mg  and melatonin 10mg       Past medical history:    has no past medical history on file.    Past surgical history:    has no past surgical history on file.     Social history:  He  reports that he has never smoked. He has never used smokeless tobacco.     Family history:  His  family history is not on file.     Medications:  He  has a current medication list which includes the following prescription(s): atenolol, bupropion, vitamin d3, clonazepam, docusate sodium, fluticasone propionate, loratadine, naloxegol  oxalate, pregabalin, rosuvastatin, jatenzo, valsartan, and zonisamide.      Allergies:  He is allergic to atorvastatin.     Review of Systems:  10/14 systems reviewed, all negative except where mentioned in HPI or below:    Physical Exam:  There were no vitals taken for this visit.   General:  Well groomed who looks as stated age of 62 year old. No acute distress.   Neurological: Alert and Oriented x 3. Grossly normal, normal and symmetrical strength  Psychiatric: Normal mental status, mood is normal     LABORATORY DATA:  TSH: No results found for: "TSH"   CBC: No results found for: "WBC", "RBC", "HGB", "HCT", "MCV", "MCH", "MCHC", "RDW", "PLT", "MPV"   Glucose: No results found for: "GLU"  Ferritin: No results found for: "FERRITIN"   Iron Saturation: No results found for: "PSAT"   B12: No results found for: "B12"   No results found for: "NA", "K", "CL", "BICARB", "BUN", "CREAT", "GLU", "[place]", "ALK", "AST", "ALT", "TP", "ALB", "TBILI"    PAP Download   Type of PAP  Type of PAP:: Auto PAP (AIRSENSE 11 AUTOSET)  Pressure  PAP pressure: MIN 7 MAX 12  DATE  Start Date: 09/22/22  Download period  Number of days: 30  Percentage greater than 4 hours  % > 4 Hrs: 90  Average number of hrs per night when using  Average hours: 6HRS 3 MIN  AHI  Residual: 0.6  CPAP Compliance  Days Used: 28 Days  Total Days in Possession: 30 Days  Percentage of Days Used: 93.33 %  Leaks: 7.9 liters/hour    IMPRESSION:   Hunter Shaw is a 62 year old male with PMH of Parkinson's, RBD, OSA , anxiety, chronic back pain, diabetes 1.5, dyslipidemia, BPH, HTN, peripheral neuropathy who presents today for follow up on RBD and medication changes.  OSA on PAP therapy    #. Obstructive Sleep Apnea:  PSG performed on 03/23/2020 with a sleep efficiency of 64% demonstrated an AHI of 8.5/hr He is using and benefitting from PAP therapy demonstrated by a residual AHI of 0.5 HR and days used 76%.  He is using and benefitting from PAP therapy demonstrated by a  residual AHI of 0.6/HR days used 93%.    #. RBD:  Continue clonazepam at 1 mg and increase melatonin by 5 mg.  Currently have increased episodes where his wife has to keep him from jumping out of bed.      PLAN:  #.  Continue clonazepam at 1 mg to refills were given, major  side effects were reviewed  #.  Increase Melatonin By 5 mg for a total of 15 mg to see if RBD symptoms are better controlled.  I  #.  Encourage patient to use PAP therapy any time he sleeps  #.  Sleep safety reviewed  #. Follow up at the sleep clinic in 6 months with MD. Please contact clinic if you have any questions or concerns that arise before your next appointment.       Sincerely,  Kirtland Bouchard NP-C    I have personally provided of clinical care time. Time includes review of results, clarification of symptoms, treatment recommendations, communication to arrange next steps and documentation of the above.    PLEASE NOTE THAT PORTIONS OF THE ABOVE NOTE  DICTATED WITH M-MODAL VOICE ACTIVATED SOFTWARE WHICH MAY HAVE PRODUCED MINIMAL TYPOGRAPHICAL ERRORS OR SOUND-ALIKE WORD CHANGES.

## 2022-10-23 NOTE — Patient Instructions (Signed)
After visit summary          Please follow up in sleep center as needed or in  6 months our phone number is [phone]    Follow-up in 6 months for medication refill    Clonazepam refilled today    Continue to use PAP therapy any time you sleep        If a Consult or referral has been placed for you . Please wait a few days for it to be processed. Then you can call the main line at [phone] and they can direct you.    For resources about sleep and sleep issues you can go to:  Sleep foundation: .sleepfoundation.org  American Academy of sleep medicine: aasm.org    For Billing issues:Use my chart or see below.   Single Billing Office (SBO)  [address], [address], [place] [address]  [phone]  Phone hours: 8:30 a.m.-4 p.m., Monday through Friday    Medical Records  Need a copy of your medical records? [facility] offers two ways to get them.  MyChart Patient Portal  You may request -- and receive -- most of your medical records through a [facility] patient portal account. This online portal not only gives you access to most of your medical records, it also allows you to schedule appointments, communicate with your care team, request prescription renewals and more.  Log into MyChart >     Note: Some medical records may be available only through our [facility] Records department.  Printed Copies  To obtain a printed copy of your medical records, please follow these steps:  Download and print an Authorization for Release of Health Information form in English or download and print an Authorization for Release of Health Information form in Spanish  Complete, sign and date the form. In order to verify your identification and validate your authorization, you are required to include a legible copy of a valid photo identification (e.g., a driver's license, a military ID or a state ID).  Email, fax, mail or deliver your paperwork in person.  Medical record fees: There is no charge to send your  medical records to another healthcare provider. For records released directly to a patient or an authorized family member there is no charge for the first 72 pages. Additional pages are charged at $0.25 per page.  Email  We accept email requests only from patients and providers at [email or web address]  All attorney, copy service and/or subpoena requests must be delivered to the Health Information Management Department.  Mail  Mail your request for a paper copy of your medical records to:  [place], [id number]  [place], [place] [address]  Please allow seven to 10 business days for processing from date of receipt of the completed authorization. [place] law permits up to 15 days to respond to requests for medical records.   Contact us  Our hours of operation are Monday through Friday, 8 a.m. to 4:30 p.m., excluding holidays. You may reach [place] at:  Phone: [phone]  Fax: [phone]  Urgent Requests/Records for Your Physician  For immediate continuity of care, you or your healthcare providers may request that records to be sent directly to their office.  The physician's office must send a written request on business letterhead to [phone], indicating the patient's name, date of birth and date of visit. Please write "STAT" at the top of the  request. For assistance, call [phone], press option 5, followed by option 2.

## 2022-11-16 ENCOUNTER — Other Ambulatory Visit: Payer: Self-pay | Admitting: Neurology

## 2022-11-22 ENCOUNTER — Encounter: Payer: Self-pay | Admitting: Neurology

## 2022-11-26 MED ORDER — ZONISAMIDE 50 MG OR CAPS
50.0000 mg | ORAL_CAPSULE | Freq: Three times a day (TID) | ORAL | 0 refills | Status: DC
Start: 2022-11-26 — End: 2023-02-17

## 2022-11-26 NOTE — Telephone Encounter (Signed)
Patient is requesting refills for Zonisamide.     Last Seen: 10/08/2022 (Zoom)     Refills pended for covering MD to review.     Wille Celeste MA

## 2022-12-05 ENCOUNTER — Encounter: Payer: Self-pay | Admitting: Neurology

## 2022-12-05 ENCOUNTER — Telehealth: Payer: Commercial Managed Care - PPO | Admitting: Neurology

## 2022-12-05 DIAGNOSIS — G20C Parkinsonism, unspecified (CMS-HCC): Secondary | ICD-10-CM

## 2022-12-05 NOTE — Progress Notes (Signed)
Subjective:   Hunter Shaw is a 62 year old male who is here for Parkinson's Syndrome    This telemedicine visit was conducted Audio+Video. The patient was in [place] for the duration of the visit.  The patient consented to a virtual visit, understanding the risks, such as a limited examination, and was aware that an in-person visit was available to them. Total time spent was 11-20 minutes.     HPI    Pt with parkinsonism returns for follow up via telehealth. He is living in [place] and has not established care with a new MD yet. He questions whether there are good Movement doctors at [facility]. His tremor got worse because he ran out of zonisamide and had trouble getting the prescription filled. He has been back on it for about a week and is now doing well again.      Review of Systems     Current Outpatient Medications   Medication Sig Dispense Refill    atenolol (TENORMIN) 25 MG tablet Take 1 tablet (25 mg) by mouth daily.      buPROPion (WELLBUTRIN) 100 MG tablet Take 1 tablet (100 mg) by mouth daily.      Cholecalciferol (VITAMIN D3) 125 MCG (5000 UT) capsule Take 1 capsule (5,000 Units) by mouth.      clonazePAM (KLONOPIN) 1 MG tablet Take 1 tablet (1 mg) by mouth nightly. 30 tablet 2    docusate sodium (COLACE) 250 MG capsule Take 1 capsule (250 mg) by mouth 2 times daily.      fluticasone propionate (FLONASE) 50 MCG/ACT nasal spray Spray 1 spray into each nostril daily. 16 g 3    loratadine (CLARITIN) 10 MG tablet Take 1 tablet (10 mg) by mouth daily.      Naloxegol Oxalate (MOVANTIK PO)       pregabalin (LYRICA) 100 MG capsule Take 1 capsule (100 mg) by mouth 3 times daily.      rosuvastatin (CRESTOR) 40 MG tablet Take 0.5 tablets (20 mg) by mouth daily. 20 mg per pt      Testosterone Undecanoate (JATENZO) 198 MG CAPS BID per pt      valsartan (DIOVAN) 320 MG tablet Take 1 tablet (320 mg) by mouth daily.      zonisamide (ZONEGRAN) 50 MG capsule Take 1 capsule (50 mg) by mouth 3 times daily. 270 capsule 0      No current facility-administered medications for this visit.     Allergies   Allergen Reactions    Atorvastatin Swelling and Unspecified       Reviewed patients pertinent information related to social history, past medical, past surgical, and family history.     Objective:  Vital signs: There were no vitals taken for this visit.    Neurological Exam  Mental Status   Speech is normal.    Motor  Normal muscle bulk throughout.  No dyskinesia  Hand grip 2+ R, 2.5 L.    Coordination    No rest tremor noted  No postural tremor   Slight action tremor BUE.      Assessment/Plan:  Hunter Shaw was seen today for Parkinson's Syndrome     [name] is doing well with treatment with zonisamide. He has not found a new local doctor yet and I advised him that he should see one of the Movement Disorders specialists at [facility] and he agrees to schedule an appointment. He will continue his current medications. He will follow up with me on an as needed basis.  [name] MD  Professor of Neurology    I personally spent total 20 minutes in face-to-face and non-face-to-face activities  related to the patient's visit today, excluding any separately reportable  services/procedures      Diagnosed with:    ICD-10-CM ICD-9-CM    1. Parkinsonism, unspecified Parkinsonism type (CMS-HCC)  G20.C 332.0

## 2022-12-26 ENCOUNTER — Telehealth: Payer: Self-pay | Admitting: Neurology

## 2022-12-26 NOTE — Telephone Encounter (Signed)
Patient called stating he recently moved and was given some options for other neurologist he can see by Dr. Kellie Shropshire. Patient states he chose one and needs Dr. Kellie Shropshire to place a referral for him to see Dr. Doralee Albino. Per patient referral has to be faxed to Dr. Kirt Boys office (fax#[phone]) along with his records so that Dr. Garlon Hatchet can review his records first before he gets scheduled, phone number to Dr. Kirt Boys office if needed is [phone].    Please assist, thank you.

## 2022-12-30 ENCOUNTER — Encounter: Payer: Self-pay | Admitting: Neurology

## 2022-12-31 ENCOUNTER — Encounter: Payer: Self-pay | Admitting: Neurology

## 2022-12-31 DIAGNOSIS — G20C Parkinsonism, unspecified (CMS-HCC): Secondary | ICD-10-CM

## 2022-12-31 DIAGNOSIS — G20A1 Parkinson's disease without dyskinesia, without mention of fluctuations (CMS-HCC): Secondary | ICD-10-CM

## 2023-01-02 NOTE — Addendum Note (Signed)
Addended by: Melton Krebs on: 01/02/2023 03:59 PM     Modules accepted: Orders

## 2023-01-03 NOTE — Telephone Encounter (Signed)
Referral faxed attached with most recent progress notes to Fax Number provided.     Wille Celeste MA

## 2023-01-09 ENCOUNTER — Encounter: Payer: Self-pay | Admitting: Neurology

## 2023-02-04 ENCOUNTER — Other Ambulatory Visit: Payer: Self-pay | Admitting: Nurse Practitioner

## 2023-02-04 DIAGNOSIS — G4752 REM sleep behavior disorder: Secondary | ICD-10-CM

## 2023-02-05 MED ORDER — CLONAZEPAM 1 MG OR TABS
1.0000 mg | ORAL_TABLET | Freq: Every evening | ORAL | 2 refills | Status: DC
Start: 2023-02-05 — End: 2023-04-25

## 2023-02-17 ENCOUNTER — Other Ambulatory Visit: Payer: Self-pay | Admitting: Neurology

## 2023-02-19 MED ORDER — ZONISAMIDE 50 MG OR CAPS
50.0000 mg | ORAL_CAPSULE | Freq: Three times a day (TID) | ORAL | 0 refills | Status: DC
Start: 2023-02-19 — End: 2023-05-18

## 2023-04-08 ENCOUNTER — Encounter: Payer: Self-pay | Admitting: Nurse Practitioner

## 2023-04-11 ENCOUNTER — Encounter: Payer: Self-pay | Admitting: Neurology

## 2023-04-17 ENCOUNTER — Encounter: Payer: Self-pay | Admitting: Neurology

## 2023-04-25 ENCOUNTER — Encounter: Payer: Self-pay | Admitting: Nurse Practitioner

## 2023-04-25 ENCOUNTER — Telehealth: Payer: BC Managed Care – PPO | Admitting: Nurse Practitioner

## 2023-04-25 DIAGNOSIS — G4733 Obstructive sleep apnea (adult) (pediatric): Secondary | ICD-10-CM

## 2023-04-25 DIAGNOSIS — G4752 REM sleep behavior disorder: Secondary | ICD-10-CM

## 2023-04-25 MED ORDER — CLONAZEPAM 1 MG OR TABS
1.0000 mg | ORAL_TABLET | Freq: Every evening | ORAL | 2 refills | Status: DC
Start: 2023-04-25 — End: 2023-08-11

## 2023-04-25 NOTE — Patient Instructions (Addendum)
Consider read night lights    Clonazepam refilled today

## 2023-04-25 NOTE — Progress Notes (Signed)
Hunter Shaw  MR #: [id number]  DOB: [birth year]    Zoom Meeting :    This service is being conducted via video.   The patient confirmed to be in the [place] of [place] presently.    The patient gave consent to proceed with the video/telephone visit today      I had the pleasure of seeing Hunter Shaw for follow up at the [facility].  He is a 62 year old male with PMH of Parkinson's, RBD, OSA , anxiety, chronic back pain, diabetes 1.5, dyslipidemia, BPH, HTN, peripheral neuropathy, who presents for follow-up of RBD and medication changes.  OSA on PAP therapy    Interval History:  Since the last visit, [name] now that he is using RemZzz type product  no longer has a dry mouth is help sealing his mask.  From the mask rubbing he did not up with a large cyst.    Dream enactment has reduced overall.  During the day when he accidentally falls asleep his wife has save a few times form falling.     He can't fall asleep if that is total darkness.  He needs the TV on.        Machine: ResMed, APAP  Mask:  Fullface mask    Epworth Sleep Scale Score: 7    FOSQ Score: 15.5    Sleep wake routine was reviewed:   Usual bedtime:10 pm   Wake up time:  530-630 am   Reported sleep latency:  5-60 min watches TV in bed  Reported hours of sleep/night: 6 hours  Number of awakenings during the night/Duration: 0/   Daytime napping: he accidentally  naps sometimes  Use medications/supplements to sleep?: yes , clonazepam 1 mg  and melatonin 10mg       Past medical history:    has no past medical history on file.    Past surgical history:    has no past surgical history on file.     Social history:  He  reports that he has never smoked. He has never used smokeless tobacco.     Family history:  His  family history is not on file.     Medications:  He  has a current medication list which includes the following prescription(s): atenolol, bupropion, vitamin d3, clonazepam, docusate sodium, fluticasone propionate, loratadine, naloxegol  oxalate, pregabalin, rosuvastatin, jatenzo, valsartan, and zonisamide.      Allergies:  He is allergic to atorvastatin.     Review of Systems:  10/14 systems reviewed, all negative except where mentioned in HPI or below:    Physical Exam:  There were no vitals taken for this visit.   General:  Well groomed who looks as stated age of 62 year old. No acute distress.   Neurological: Alert and Oriented x 3. Grossly normal, normal and symmetrical strength  Psychiatric: Normal mental status, mood is normal     LABORATORY DATA:  TSH: No results found for: "TSH"   CBC: No results found for: "WBC", "RBC", "HGB", "HCT", "MCV", "MCH", "MCHC", "RDW", "PLT", "MPV"   Glucose: No results found for: "GLU"  Ferritin: No results found for: "FERRITIN"   Iron Saturation: No results found for: "PSAT"   B12: No results found for: "B12"   No results found for: "NA", "K", "CL", "BICARB", "BUN", "CREAT", "GLU", "", "ALK", "AST", "ALT", "TP", "ALB", "TBILI"    PAP Download   Type of PAP  Type of PAP:: Auto PAP (AIRSENSE 11 AUTOSET)  Pressure  PAP pressure: MIN 7 MAX 12  DATE  Start Date: 03/25/23  Download period  Number of days: 30  Percentage greater than 4 hours  % > 4 Hrs: 80  Average number of hrs per night when using  Average hours: 6 HRS 17 MINS  AHI  Residual: 1.4  CPAP Compliance  Days Used: 26 Days  Total Days in Possession: 30 Days  Percentage of Days Used: 86.67 %  Leaks: 5.9 liters/hour    IMPRESSION:   Hunter Shaw is a 62 year old male with PMH of Parkinson's, RBD, OSA , anxiety, chronic back pain, diabetes 1.5, dyslipidemia, BPH, HTN, peripheral neuropathy who presents today for follow up on RBD and medication changes.  OSA on PAP therapy    #. Obstructive Sleep Apnea:  PSG performed on 03/23/2020 with a sleep efficiency of 64% demonstrated an AHI of 8.5/hr He is using and benefitting from PAP therapy demonstrated by a residual AHI of 0.5 HR and days used 76%.  He is using and benefitting from PAP therapy demonstrated by a  residual AHI of 0.6/HR days used 93%.    #. RBD:  Continue clonazepam at 1 mg and melatonin at 10 mg.  Episodes of were all reduced and well-controlled.    PLAN:  #.  Refilled clonazepam at 1 mg today  #.  Continue melatonin at 10 mg I\  #.  Encourage patient to use PAP therapy any time he sleeps  #.  Sleep safety reviewed  #. Follow up at the sleep clinic in 6 months . Please contact clinic if you have any questions or concerns that arise before your next appointment.  #. Cures report reviewed no red flags.   #.  Turn the TV off when you sleep and consider getting red night lights     Sincerely,  Kirtland Bouchard NP-C    I have personally provided 30 minutes of clinical care time. Time includes review of results, clarification of symptoms, treatment recommendations, communication to arrange next steps and documentation of the above.    PLEASE NOTE THAT PORTIONS OF THE ABOVE NOTE  DICTATED WITH M-MODAL VOICE ACTIVATED SOFTWARE WHICH MAY HAVE PRODUCED MINIMAL TYPOGRAPHICAL ERRORS OR SOUND-ALIKE WORD CHANGES.

## 2023-04-28 ENCOUNTER — Telehealth: Payer: Self-pay

## 2023-04-28 NOTE — Telephone Encounter (Signed)
Lvm to call back to schedule 6 month follow up appointment

## 2023-05-04 ENCOUNTER — Other Ambulatory Visit: Payer: Self-pay | Admitting: Neurology

## 2023-05-09 ENCOUNTER — Other Ambulatory Visit: Payer: Self-pay | Admitting: Neurology

## 2023-05-15 ENCOUNTER — Other Ambulatory Visit: Payer: Self-pay | Admitting: Neurology

## 2023-05-18 MED ORDER — ZONISAMIDE 50 MG OR CAPS
50.0000 mg | ORAL_CAPSULE | Freq: Three times a day (TID) | ORAL | 0 refills | Status: DC
Start: 2023-05-18 — End: 2023-07-09

## 2023-07-09 ENCOUNTER — Other Ambulatory Visit: Payer: Self-pay | Admitting: Neurology

## 2023-07-09 MED ORDER — ZONISAMIDE 50 MG OR CAPS
50.0000 mg | ORAL_CAPSULE | Freq: Three times a day (TID) | ORAL | 0 refills | Status: AC
Start: 2023-07-09 — End: ?

## 2023-08-11 ENCOUNTER — Other Ambulatory Visit: Payer: Self-pay | Admitting: Nurse Practitioner

## 2023-08-11 DIAGNOSIS — G4752 REM sleep behavior disorder: Secondary | ICD-10-CM

## 2023-08-11 MED ORDER — CLONAZEPAM 1 MG OR TABS
1.0000 mg | ORAL_TABLET | Freq: Every evening | ORAL | 0 refills | Status: DC
Start: 2023-08-11 — End: 2023-12-12

## 2023-08-11 NOTE — Telephone Encounter (Signed)
Pt has a follow up appointment scheduled for 11/06/22

## 2023-08-15 MED ORDER — CLONAZEPAM 1 MG OR TABS
1.0000 mg | ORAL_TABLET | Freq: Every evening | ORAL | 2 refills | Status: AC
Start: 2023-08-15 — End: ?

## 2023-08-15 NOTE — Telephone Encounter (Signed)
Performed doctor shopping or red flags noted

## 2023-08-23 ENCOUNTER — Encounter: Payer: Self-pay | Admitting: Nurse Practitioner

## 2023-09-08 ENCOUNTER — Encounter: Payer: Self-pay | Admitting: Nurse Practitioner

## 2023-09-16 ENCOUNTER — Telehealth: Payer: Commercial Managed Care - PPO | Admitting: Nurse Practitioner

## 2023-09-16 DIAGNOSIS — Z7189 Other specified counseling: Secondary | ICD-10-CM

## 2023-09-16 DIAGNOSIS — G4733 Obstructive sleep apnea (adult) (pediatric): Secondary | ICD-10-CM

## 2023-09-16 DIAGNOSIS — G2581 Restless legs syndrome: Secondary | ICD-10-CM

## 2023-09-16 DIAGNOSIS — G4752 REM sleep behavior disorder: Secondary | ICD-10-CM

## 2023-09-16 DIAGNOSIS — Z789 Other specified health status: Secondary | ICD-10-CM

## 2023-09-16 MED ORDER — CLONAZEPAM 1 MG OR TABS
1.0000 mg | ORAL_TABLET | Freq: Every evening | ORAL | 2 refills | Status: AC
Start: 2023-09-16 — End: ?

## 2023-09-16 NOTE — Progress Notes (Addendum)
Hunter Shaw  MR #: [id number]  DOB: [birth year]    Zoom Meeting :    This service is being conducted via video.   The patient confirmed to be in the [place] of [place] presently.    The patient gave consent to proceed with the video/telephone visit today    I had the pleasure of seeing Hunter Shaw for follow up at the [facility].  He is a 63 year old male with PMH of Parkinson's, RBD, OSA , anxiety, chronic back pain, diabetes 1.5, dyslipidemia, BPH, HTN, peripheral neuropathy, who presents for follow-up of for management of PAP therapy and RBD.    Interval History:  Since the last visit, Hunter Shaw reports he sleep better with out PAP therapy and can only wear it for a few hours a night.  He reports sometimes he has a apneas per hour and other nights he had 3.     He continues to taking 1mg  clonazepam and has a mild episode every couple of months.  Reports he has been turing off the TV and staes it has helped.  He falls aleep faster      Machine: ResMed, APAP  Mask:  Fullface mask    Epworth Sleep Scale Score: 7    FOSQ Score: 14    Sleep wake routine was reviewed:   Usual bedtime: 10 pm   Wake up time:  530-630 am   Reported sleep latency:  15 min improved used to keep the TV on  Reported hours of sleep/night: 6 hours  Number of awakenings during the night/Duration: 3-4 with PAP therapy  Daytime napping: he accidentally  naps sometimes  Use medications/supplements to sleep?: yes , clonazepam 1 mg  and melatonin 10mg       Past medical history:    has no past medical history on file.    Past surgical history:    has no past surgical history on file.     Social history:  He  reports that he has never smoked. He has never used smokeless tobacco.     Family history:  His  family history is not on file.     Medications:  He  has a current medication list which includes the following prescription(s): atenolol, bupropion, vitamin d3, clonazepam, clonazepam, docusate sodium, fluticasone propionate,  loratadine, naloxegol oxalate, pregabalin, rosuvastatin, jatenzo, valsartan, and zonisamide.      Allergies:  He is allergic to atorvastatin.     Review of Systems:  Comprehensive 12-point review of systems is negative, except as noted in HPI      Physical Exam:  There were no vitals taken for this visit.   General:  Well-nourished, who looks as stated age of 63 year old.  No distress noted   Neurological:  Alert and oriented x3, clear speech  Psychiatric: Normal mental status, mood is normal     LABORATORY DATA:  TSH: No results found for: "TSH"   CBC: No results found for: "WBC", "RBC", "HGB", "HCT", "MCV", "MCH", "MCHC", "RDW", "PLT", "MPV"   Glucose: No results found for: "GLU"  Ferritin: No results found for: "FERRITIN"   Iron Saturation: No results found for: "PSAT"   B12: No results found for: "B12"   No results found for: "NA", "K", "CL", "BICARB", "BUN", "CREAT", "GLU", "[place]", "ALK", "AST", "ALT", "TP", "ALB", "TBILI"    PAP Download   Type of PAP  Type of PAP:: Auto PAP (AIRSENSE 11 AUTOSET)  Pressure  PAP pressure: MIN 7  MAX 12  DATE  Start Date: 08/15/23  Download period  Number of days: 30  Percentage greater than 4 hours  % > 4 Hrs: 60  Average number of hrs per night when using  Average hours: 4 HRS 34 MINS  AHI  Residual: 3.4  CPAP Compliance  Days Used: 25 Days  Total Days in Possession: 30 Days  Percentage of Days Used: 83.33 %  Leaks: 9.1 liters/hour    IMPRESSION:   Hunter Shaw is a 63 year old male with PMH of Parkinson's, RBD, OSA , anxiety, chronic back pain, diabetes 1.5, dyslipidemia, BPH, HTN, peripheral neuropathy who presents today for follow up for management of RVD and PAP therapy    #. Obstructive Sleep Apnea:  PSG performed on 03/23/2020 with a sleep efficiency of 64% demonstrated an AHI of 8.5/hr .  He has used is slowly decreasing demonstrated by hours used nightly less than 4.  Currently he uses it nightly 83.3% with a residual AHI of 3.4/HR    #. RBD:  Continue clonazepam at 1 mg  and melatonin at 10 mg. Well controlled at this time.     # intolerant to CPAP:    PLAN:  #.  1 mg of clonazepam nightly refilled today  #.  Continue melatonin 10 mg nightly  #.  PSG ordered due to CPAP failure  #.  Discussed the importance of CPAP use with clonazepam  #. Follow up at the sleep in 6 months with Dr. Josefa Half . Please contact clinic if you have any questions or concerns that arise before your next appointment.  #.  Cures report performed        Sincerely,  Kirtland Bouchard NP-C    I have personally provided 30 minutes of clinical care time. Time includes review of results, clarification of symptoms, treatment recommendations, communication to arrange next steps and documentation of the above.    PLEASE NOTE THAT PORTIONS OF THE ABOVE NOTE  DICTATED WITH M-MODAL VOICE ACTIVATED SOFTWARE WHICH MAY HAVE PRODUCED MINIMAL TYPOGRAPHICAL ERRORS OR SOUND-ALIKE WORD CHANGES.

## 2023-09-16 NOTE — Progress Notes (Signed)
LVM TO SCH

## 2023-09-25 ENCOUNTER — Telehealth: Payer: Self-pay | Admitting: Nurse Practitioner

## 2023-09-25 NOTE — Telephone Encounter (Signed)
Patient called to schedule for a sleep study I warmed transferred call to sleep tech

## 2023-09-26 NOTE — Telephone Encounter (Signed)
Sleep Study was scheduled for 3/17

## 2023-10-22 ENCOUNTER — Telehealth: Payer: Self-pay | Admitting: Nurse Practitioner

## 2023-10-22 NOTE — Telephone Encounter (Signed)
 Called [employer] an then called [employer] and will fax in the request for an appeal.    Call reference [phone]

## 2023-10-23 ENCOUNTER — Telehealth: Payer: Self-pay | Admitting: Nurse Practitioner

## 2023-10-23 NOTE — Telephone Encounter (Signed)
 LEFT VOICEMAIL REGARDING PSG DENIAL. WE NEED TO CANCEL APPT SINCE IT WILL NOT BE APPROVED BY APPT DATE. ASKED PATIENT TO CALL BACK TO CONFIRM THEY RECEIVED MSG

## 2023-10-23 NOTE — Telephone Encounter (Signed)
 Patient called stating he is calling to confirm he received the message of his 10/27/23 appointment being canceled due to insurance denial.

## 2023-10-28 ENCOUNTER — Encounter: Payer: Self-pay | Admitting: Nurse Practitioner

## 2023-10-30 ENCOUNTER — Encounter: Payer: Self-pay | Admitting: Nurse Practitioner

## 2023-11-06 ENCOUNTER — Encounter: Payer: Commercial Managed Care - PPO | Admitting: Nurse Practitioner

## 2023-11-08 ENCOUNTER — Other Ambulatory Visit: Payer: Self-pay | Admitting: Neurology

## 2023-12-12 ENCOUNTER — Other Ambulatory Visit: Payer: Self-pay | Admitting: Nurse Practitioner

## 2023-12-12 DIAGNOSIS — G4752 REM sleep behavior disorder: Secondary | ICD-10-CM

## 2023-12-12 MED ORDER — CLONAZEPAM 1 MG OR TABS
1.0000 mg | ORAL_TABLET | Freq: Every evening | ORAL | 0 refills | Status: AC
Start: 2023-12-12 — End: ?

## 2023-12-15 ENCOUNTER — Encounter: Payer: Self-pay | Admitting: Nurse Practitioner

## 2023-12-16 ENCOUNTER — Telehealth: Payer: Self-pay | Admitting: Nurse Practitioner

## 2023-12-16 NOTE — Telephone Encounter (Signed)
 Patient called states he needs a early refill on medication Clonazepam  1mg  he will be going out of town and needs before he leaves 05/07 till 05/14. Patient has 3 pills left    Please assist

## 2023-12-17 NOTE — Telephone Encounter (Signed)
 Reached out to pharmacy to follow up on request , they are stating pt meds where not released due to being to early I have inform pharmacy we sent it early because pt is going on vacation , the pharmacy was not aware and did not release meds patient is aware of issue and wasn't able obtain medication.

## 2024-02-04 ENCOUNTER — Other Ambulatory Visit: Payer: Self-pay | Admitting: Nurse Practitioner

## 2024-02-04 ENCOUNTER — Telehealth: Payer: Self-pay | Admitting: Nurse Practitioner

## 2024-02-04 DIAGNOSIS — G4752 REM sleep behavior disorder: Secondary | ICD-10-CM

## 2024-02-04 NOTE — Telephone Encounter (Signed)
 Pt had refill remaining . Left pt detailed message to call back to inform med Is available to be picked up and to please schedule f/u .

## 2024-02-04 NOTE — Telephone Encounter (Signed)
 Per Pharmacy patient has no refills left for clonazePAM  (KLONOPIN ) 1 MG tablet. Patient requesting refill.    Please assist

## 2024-02-04 NOTE — Telephone Encounter (Signed)
 I have rerouted prior request to cheryl to further assist

## 2024-02-11 ENCOUNTER — Encounter: Payer: Self-pay | Admitting: Nurse Practitioner

## 2024-02-12 ENCOUNTER — Encounter: Admitting: Nurse Practitioner

## 2024-02-12 ENCOUNTER — Telehealth: Payer: Self-pay

## 2024-02-12 NOTE — Telephone Encounter (Signed)
 LVM TO CALL BACK TO SCHEDULED FOLLOW UP APPOINTMENT

## 2024-06-29 ENCOUNTER — Telehealth: Payer: Self-pay

## 2024-06-29 NOTE — Telephone Encounter (Signed)
 Left voicemail to call back to schedule follow up appointment was last seen 09/2023

## 2024-06-29 NOTE — Telephone Encounter (Signed)
-----   Message from Elma LELON Cummins sent at 06/28/2024  3:53 PM PST -----  Regarding: needs f/u  Pt overdue for f/u appt with [facility] or Dr. Valera. Last saw in February 2025 and was recommended 6 month f/u
